# Patient Record
Sex: Female | Born: 1950 | Race: White | Hispanic: No | Marital: Married | Smoking: Never smoker
Health system: Southern US, Community
[De-identification: ages and names within clinical notes are randomized; demographics above are authoritative.]

## PROBLEM LIST (undated history)

## (undated) DIAGNOSIS — J40 Bronchitis, not specified as acute or chronic: Secondary | ICD-10-CM

## (undated) DIAGNOSIS — E785 Hyperlipidemia, unspecified: Secondary | ICD-10-CM

## (undated) DIAGNOSIS — G4730 Sleep apnea, unspecified: Secondary | ICD-10-CM

## (undated) DIAGNOSIS — R55 Syncope and collapse: Secondary | ICD-10-CM

## (undated) DIAGNOSIS — T7840XA Allergy, unspecified, initial encounter: Secondary | ICD-10-CM

## (undated) DIAGNOSIS — I5189 Other ill-defined heart diseases: Secondary | ICD-10-CM

## (undated) DIAGNOSIS — K649 Unspecified hemorrhoids: Secondary | ICD-10-CM

## (undated) DIAGNOSIS — A389 Scarlet fever, uncomplicated: Secondary | ICD-10-CM

## (undated) DIAGNOSIS — I10 Essential (primary) hypertension: Secondary | ICD-10-CM

## (undated) DIAGNOSIS — I2510 Atherosclerotic heart disease of native coronary artery without angina pectoris: Secondary | ICD-10-CM

## (undated) DIAGNOSIS — U071 COVID-19: Secondary | ICD-10-CM

## (undated) HISTORY — DX: Syncope and collapse: R55

## (undated) HISTORY — PX: CARPAL TUNNEL RELEASE: SHX101

## (undated) HISTORY — DX: Other ill-defined heart diseases: I51.89

## (undated) HISTORY — DX: Allergy, unspecified, initial encounter: T78.40XA

## (undated) HISTORY — DX: Unspecified hemorrhoids: K64.9

## (undated) HISTORY — DX: Bronchitis, not specified as acute or chronic: J40

## (undated) HISTORY — DX: Atherosclerotic heart disease of native coronary artery without angina pectoris: I25.10

## (undated) HISTORY — DX: Hyperlipidemia, unspecified: E78.5

## (undated) HISTORY — PX: OTHER SURGICAL HISTORY: SHX169

## (undated) HISTORY — DX: Essential (primary) hypertension: I10

## (undated) HISTORY — PX: APPENDECTOMY: SHX54

## (undated) HISTORY — DX: Scarlet fever, uncomplicated: A38.9

---

## 2008-09-28 LAB — HM COLONOSCOPY

## 2012-03-09 LAB — HM MAMMOGRAPHY

## 2015-11-28 LAB — HM PAP SMEAR: HM Pap smear: NEGATIVE

## 2017-04-20 ENCOUNTER — Ambulatory Visit: Payer: Self-pay | Admitting: Internal Medicine

## 2017-05-22 DIAGNOSIS — R69 Illness, unspecified: Secondary | ICD-10-CM | POA: Diagnosis not present

## 2017-05-25 DIAGNOSIS — H524 Presbyopia: Secondary | ICD-10-CM | POA: Diagnosis not present

## 2017-05-27 ENCOUNTER — Telehealth: Payer: Self-pay | Admitting: Internal Medicine

## 2017-05-27 ENCOUNTER — Encounter: Payer: Self-pay | Admitting: Internal Medicine

## 2017-05-27 ENCOUNTER — Ambulatory Visit (INDEPENDENT_AMBULATORY_CARE_PROVIDER_SITE_OTHER): Payer: Medicare HMO | Admitting: Internal Medicine

## 2017-05-27 VITALS — BP 150/76 | HR 56 | Temp 97.6°F | Ht <= 58 in | Wt 135.4 lb

## 2017-05-27 DIAGNOSIS — I10 Essential (primary) hypertension: Secondary | ICD-10-CM

## 2017-05-27 MED ORDER — METOPROLOL SUCCINATE ER 50 MG PO TB24: 50.0000 mg | ORAL_TABLET | Freq: Every day | ORAL | 0 refills | Status: DC

## 2017-05-27 MED ORDER — AMLODIPINE BESYLATE 2.5 MG PO TABS: 2.5000 mg | ORAL_TABLET | Freq: Two times a day (BID) | ORAL | 1 refills | Status: DC

## 2017-05-27 NOTE — Patient Instructions (Addendum)
Please record blood pressure at home  Increase Norvasc 2.5 mg 2x per day  And cont metoprolol  Follow up in 1 month   Hypertension Hypertension, commonly called high blood pressure, is when the force of blood pumping through the arteries is too strong. The arteries are the blood vessels that carry blood from the heart throughout the body. Hypertension forces the heart to work harder to pump blood and may cause arteries to become narrow or stiff. Having untreated or uncontrolled hypertension can cause heart attacks, strokes, kidney disease, and other problems. A blood pressure reading consists of a higher number over a lower number. Ideally, your blood pressure should be below 120/80. The first ("top") number is called the systolic pressure. It is a measure of the pressure in your arteries as your heart beats. The second ("bottom") number is called the diastolic pressure. It is a measure of the pressure in your arteries as the heart relaxes. What are the causes? The cause of this condition is not known. What increases the risk? Some risk factors for high blood pressure are under your control. Others are not. Factors you can change  Smoking.  Having type 2 diabetes mellitus, high cholesterol, or both.  Not getting enough exercise or physical activity.  Being overweight.  Having too much fat, sugar, calories, or salt (sodium) in your diet.  Drinking too much alcohol. Factors that are difficult or impossible to change  Having chronic kidney disease.  Having a family history of high blood pressure.  Age. Risk increases with age.  Race. You may be at higher risk if you are African-American.  Gender. Men are at higher risk than women before age 69. After age 69, women are at higher risk than men.  Having obstructive sleep apnea.  Stress. What are the signs or symptoms? Extremely high blood pressure (hypertensive crisis) may cause:  Headache.  Anxiety.  Shortness of  breath.  Nosebleed.  Nausea and vomiting.  Severe chest pain.  Jerky movements you cannot control (seizures).  How is this diagnosed? This condition is diagnosed by measuring your blood pressure while you are seated, with your arm resting on a surface. The cuff of the blood pressure monitor will be placed directly against the skin of your upper arm at the level of your heart. It should be measured at least twice using the same arm. Certain conditions can cause a difference in blood pressure between your right and left arms. Certain factors can cause blood pressure readings to be lower or higher than normal (elevated) for a short period of time:  When your blood pressure is higher when you are in a health care provider's office than when you are at home, this is called white coat hypertension. Most people with this condition do not need medicines.  When your blood pressure is higher at home than when you are in a health care provider's office, this is called masked hypertension. Most people with this condition may need medicines to control blood pressure.  If you have a high blood pressure reading during one visit or you have normal blood pressure with other risk factors:  You may be asked to return on a different day to have your blood pressure checked again.  You may be asked to monitor your blood pressure at home for 1 week or longer.  If you are diagnosed with hypertension, you may have other blood or imaging tests to help your health care provider understand your overall risk for other conditions.  How is this treated? This condition is treated by making healthy lifestyle changes, such as eating healthy foods, exercising more, and reducing your alcohol intake. Your health care provider may prescribe medicine if lifestyle changes are not enough to get your blood pressure under control, and if:  Your systolic blood pressure is above 130.  Your diastolic blood pressure is above  80.  Your personal target blood pressure may vary depending on your medical conditions, your age, and other factors. Follow these instructions at home: Eating and drinking  Eat a diet that is high in fiber and potassium, and low in sodium, added sugar, and fat. An example eating plan is called the DASH (Dietary Approaches to Stop Hypertension) diet. To eat this way: ? Eat plenty of fresh fruits and vegetables. Try to fill half of your plate at each meal with fruits and vegetables. ? Eat whole grains, such as whole wheat pasta, brown rice, or whole grain bread. Fill about one quarter of your plate with whole grains. ? Eat or drink low-fat dairy products, such as skim milk or low-fat yogurt. ? Avoid fatty cuts of meat, processed or cured meats, and poultry with skin. Fill about one quarter of your plate with lean proteins, such as fish, chicken without skin, beans, eggs, and tofu. ? Avoid premade and processed foods. These tend to be higher in sodium, added sugar, and fat.  Reduce your daily sodium intake. Most people with hypertension should eat less than 1,500 mg of sodium a day.  Limit alcohol intake to no more than 1 drink a day for nonpregnant women and 2 drinks a day for men. One drink equals 12 oz of beer, 5 oz of wine, or 1 oz of hard liquor. Lifestyle  Work with your health care provider to maintain a healthy body weight or to lose weight. Ask what an ideal weight is for you.  Get at least 30 minutes of exercise that causes your heart to beat faster (aerobic exercise) most days of the week. Activities may include walking, swimming, or biking.  Include exercise to strengthen your muscles (resistance exercise), such as pilates or lifting weights, as part of your weekly exercise routine. Try to do these types of exercises for 30 minutes at least 3 days a week.  Do not use any products that contain nicotine or tobacco, such as cigarettes and e-cigarettes. If you need help quitting, ask  your health care provider.  Monitor your blood pressure at home as told by your health care provider.  Keep all follow-up visits as told by your health care provider. This is important. Medicines  Take over-the-counter and prescription medicines only as told by your health care provider. Follow directions carefully. Blood pressure medicines must be taken as prescribed.  Do not skip doses of blood pressure medicine. Doing this puts you at risk for problems and can make the medicine less effective.  Ask your health care provider about side effects or reactions to medicines that you should watch for. Contact a health care provider if:  You think you are having a reaction to a medicine you are taking.  You have headaches that keep coming back (recurring).  You feel dizzy.  You have swelling in your ankles.  You have trouble with your vision. Get help right away if:  You develop a severe headache or confusion.  You have unusual weakness or numbness.  You feel faint.  You have severe pain in your chest or abdomen.  You vomit repeatedly.  You have trouble breathing. Summary  Hypertension is when the force of blood pumping through your arteries is too strong. If this condition is not controlled, it may put you at risk for serious complications.  Your personal target blood pressure may vary depending on your medical conditions, your age, and other factors. For most people, a normal blood pressure is less than 120/80.  Hypertension is treated with lifestyle changes, medicines, or a combination of both. Lifestyle changes include weight loss, eating a healthy, low-sodium diet, exercising more, and limiting alcohol. This information is not intended to replace advice given to you by your health care provider. Make sure you discuss any questions you have with your health care provider. Document Released: 05/04/2005 Document Revised: 04/01/2016 Document Reviewed: 04/01/2016 Elsevier  Interactive Patient Education  [employer].

## 2017-05-27 NOTE — Telephone Encounter (Signed)
Pt needs refills sent back  [facility]  On what was filled today due to pt last name  not being correct it wasn't [facility] it is [place].. I fixed it in the chart  [facility] just [facility] correct name

## 2017-05-27 NOTE — Progress Notes (Signed)
Chief Complaint  Patient presents with  . Establish Care   Establish care  1. HTN BP at home 137-mid 140s/80s today 158/80 on norvasc 2.5 and Metoprolol 50 er she takes both qhs  2. Scar to abdomen at times bothers her x 10-15 years on and off    Review of Systems  Constitutional: Negative for weight loss.  HENT: Negative for hearing loss.   Eyes:       No vision problems wears glasses   Respiratory: Negative for shortness of breath.   Cardiovascular: Negative for chest pain.  Gastrointestinal: Negative for abdominal pain.  Musculoskeletal: Negative for falls.  Skin: Negative for rash.  Neurological: Negative for headaches.  Psychiatric/Behavioral: Negative for memory loss.   Past Medical History:  Diagnosis Date  . Allergy   . Hypertension    Past Surgical History:  Procedure Laterality Date  . APPENDECTOMY     age [age]  . CARPAL TUNNEL RELEASE     left 2008  . OTHER SURGICAL HISTORY     left great toe fusion 2012    Family History  Problem Relation Age of Onset  . Other Mother        brain tumor died 9153   Social History   Socioeconomic History  . Marital status: Married    Spouse name: Not on file  . Number of children: Not on file  . Years of education: Not on file  . Highest education level: Not on file  Social Needs  . Financial resource strain: Not on file  . Food insecurity - worry: Not on file  . Food insecurity - inability: Not on file  . Transportation needs - medical: Not on file  . Transportation needs - non-medical: Not on file  Occupational History  . Not on file  Tobacco Use  . Smoking status: Never Smoker  . Smokeless tobacco: Never Used  Substance and Sexual Activity  . Alcohol use: No    Frequency: Never  . Drug use: No  . Sexual activity: No    Partners: Male  Other Topics Concern  . 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 amLODipine (NORVASC) 2.5 MG tablet Take 1 tablet (2.5 mg total) by mouth 2 (two) times daily.  . Carboxymeth-Glyc-Polysorb PF (REFRESH OPTIVE ADVANCED PF) 0.5-1-0.5 % SOLN Apply to eye.  . [DISCONTINUED] amLODipine (NORVASC) 2.5 MG tablet Take 2.5 mg by mouth daily.  . [DISCONTINUED] METOPROLOL SUCCINATE ER PO Take by mouth daily.    Allergies  Allergen Reactions  . Sulfa Antibiotics Itching    Throat tight  . Penicillins Rash   No results found for this or any previous visit (from the past 2160 hour(s)). Objective  Body mass index is 28.29 kg/m. Wt Readings from Last 3 Encounters:  05/27/17 135 lb 6 oz (61.4 kg)   Temp Readings from Last 3 Encounters:  05/27/17 97.6 F (36.4 C) (Oral)   BP Readings from Last 3 Encounters:  05/27/17 (!) 150/76   Pulse Readings from Last 3 Encounters:  05/27/17 (!) 56   O2 sat room air 96%  Physical Exam  Constitutional: She is oriented to person, place, and time and well-developed, well-nourished, and in no distress.  HENT:  Head: Normocephalic and atraumatic.  Mouth/Throat: Oropharynx is clear and moist and mucous membranes are normal.  Eyes: Conjunctivae are normal. Pupils are equal, round, and reactive to light.  Cardiovascular: Normal rate, regular rhythm and normal heart sounds.  No murmur heard. Pulmonary/Chest: Effort normal and breath sounds normal.  Abdominal: Soft. Bowel sounds are normal.  Neurological: She is alert and oriented to person, place, and time. Gait normal. Gait normal.  Skin: Skin is warm and dry.  Angiomas and benign nevi to skin   Psychiatric: Mood, memory, affect and judgment normal.  Nursing note and vitals reviewed.   Assessment   1. HTN  2. HM Plan   1. Increase norvasc to 2.5 mg bid, Metop ER 25 mg qhs pt takes b/c makes tired during the day  F/u in 1 month  Log BP 2.  Per pt had labs 02/2017 need to get from  old PCP Dr. Rande Brunt in [place]  Flu had 2019  pna vx had 2016 ? Which one will [facility] [place] [address]  Tdap per pt had 2013  zostervax had 2015 ? If had shingrix   Last pap had 2017 OB/GYn in [place] need to get records no h/o abnormal pap Dr. Tracey Harries in [place] Last colonoscopy 2015 nl per pt need to get records per pt they rec in 10 years no h/o polyps she has had colonoscopy x 2  Eye exam recently My eye doctor  DEXA will ask in future if has had  mammo had 02/2016 per pt need to get records from PCP  Provider: Dr. French Ana [name]

## 2017-05-28 NOTE — Telephone Encounter (Signed)
[facility] and she will be able to pick up script on 06/02/17 . Patient verbalized understanding.

## 2017-06-24 ENCOUNTER — Encounter: Payer: Self-pay | Admitting: Internal Medicine

## 2017-06-24 ENCOUNTER — Ambulatory Visit (INDEPENDENT_AMBULATORY_CARE_PROVIDER_SITE_OTHER): Payer: Medicare HMO | Admitting: Internal Medicine

## 2017-06-24 VITALS — BP 130/70 | HR 63 | Temp 98.1°F | Ht 58.5 in | Wt 137.0 lb

## 2017-06-24 DIAGNOSIS — I10 Essential (primary) hypertension: Secondary | ICD-10-CM

## 2017-06-24 DIAGNOSIS — Z13220 Encounter for screening for lipoid disorders: Secondary | ICD-10-CM

## 2017-06-24 DIAGNOSIS — R0681 Apnea, not elsewhere classified: Secondary | ICD-10-CM | POA: Diagnosis not present

## 2017-06-24 DIAGNOSIS — Z1329 Encounter for screening for other suspected endocrine disorder: Secondary | ICD-10-CM | POA: Diagnosis not present

## 2017-06-24 DIAGNOSIS — Z1231 Encounter for screening mammogram for malignant neoplasm of breast: Secondary | ICD-10-CM

## 2017-06-24 MED ORDER — AMLODIPINE BESYLATE 2.5 MG PO TABS: 2.5000 mg | ORAL_TABLET | Freq: Two times a day (BID) | ORAL | 1 refills | Status: DC

## 2017-06-24 MED ORDER — METOPROLOL SUCCINATE ER 50 MG PO TB24: 50.0000 mg | ORAL_TABLET | Freq: Every day | ORAL | 1 refills | Status: DC

## 2017-06-24 NOTE — Patient Instructions (Signed)
F/u fasting labs 11/2017 and sch f/u with me in 2 days after labs  Take care  Consider bone density and sleep study   Bone Densitometry Bone densitometry is an imaging test that uses a special X-ray to measure the amount of calcium and other minerals in your bones (bone density). This test is also known as a bone mineral density test or dual-energy X-ray absorptiometry (DXA). The test can measure bone density at your hip and your spine. It is similar to having a regular X-ray. You may have this test to:  Diagnose a condition that causes weak or thin bones (osteoporosis).  Predict your risk of a broken bone (fracture).  Determine how well osteoporosis treatment is working.  Tell a health care provider about:  Any allergies you have.  All medicines you are taking, including vitamins, herbs, eye drops, creams, and over-the-counter medicines.  Any problems you or family members have had with anesthetic medicines.  Any blood disorders you have.  Any surgeries you have had.  Any medical conditions you have.  Possibility of pregnancy.  Any other medical test you had within the previous 14 days that used contrast material. What are the risks? Generally, this is a safe procedure. However, problems can occur and may include the following:  This test exposes you to a very small amount of radiation.  The risks of radiation exposure may be greater to unborn children.  What happens before the procedure?  Do not take any calcium supplements for 24 hours before having the test. You can otherwise eat and drink what you usually do.  Take off all metal jewelry, eyeglasses, dental appliances, and any other metal objects. What happens during the procedure?  You may lie on an exam table. There will be an X-ray generator below you and an imaging device above you.  Other devices, such as boxes or braces, may be used to position your body properly for the scan.  You will need to lie still  while the machine slowly scans your body.  The images will show up on a computer monitor. What happens after the procedure? You may need more testing at a later time. This information is not intended to replace advice given to you by your health care provider. Make sure you discuss any questions you have with your health care provider. Document Released: 05/26/2004 Document Revised: 10/10/2015 Document Reviewed: 10/12/2013 Elsevier Interactive Patient Education  2018 Elsevier Inc.   Sleep Apnea Sleep apnea is a condition in which breathing pauses or becomes shallow during sleep. Episodes of sleep apnea usually last 10 seconds or longer, and they may occur as many as 20 times an hour. Sleep apnea disrupts your sleep and keeps your body from getting the rest that it needs. This condition can increase your risk of certain health problems, including:  Heart attack.  Stroke.  Obesity.  Diabetes.  Heart failure.  Irregular heartbeat.  There are three kinds of sleep apnea:  Obstructive sleep apnea. This kind is caused by a blocked or collapsed airway.  Central sleep apnea. This kind happens when the part of the brain that controls breathing does not send the correct signals to the muscles that control breathing.  Mixed sleep apnea. This is a combination of obstructive and central sleep apnea.  What are the causes? The most common cause of this condition is a collapsed or blocked airway. An airway can collapse or become blocked if:  Your throat muscles are abnormally relaxed.  Your tongue and  tonsils are larger than normal.  You are overweight.  Your airway is smaller than normal.  What increases the risk? This condition is more likely to develop in people who:  Are overweight.  Smoke.  Have a smaller than normal airway.  Are elderly.  Are female.  Drink alcohol.  Take sedatives or tranquilizers.  Have a family history of sleep apnea.  What are the signs or  symptoms? Symptoms of this condition include:  Trouble staying asleep.  Daytime sleepiness and tiredness.  Irritability.  Loud snoring.  Morning headaches.  Trouble concentrating.  Forgetfulness.  Decreased interest in sex.  Unexplained sleepiness.  Mood swings.  Personality changes.  Feelings of depression.  Waking up often during the night to urinate.  Dry mouth.  Sore throat.  How is this diagnosed? This condition may be diagnosed with:  A medical history.  A physical exam.  A series of tests that are done while you are sleeping (sleep study). These tests are usually done in a sleep lab, but they may also be done at home.  How is this treated? Treatment for this condition aims to restore normal breathing and to ease symptoms during sleep. It may involve managing health issues that can affect breathing, such as high blood pressure or obesity. Treatment may include:  Sleeping on your side.  Using a decongestant if you have nasal congestion.  Avoiding the use of depressants, including alcohol, sedatives, and narcotics.  Losing weight if you are overweight.  Making changes to your diet.  Quitting smoking.  Using a device to open your airway while you sleep, such as: ? An oral appliance. This is a custom-made mouthpiece that shifts your lower jaw forward. ? A continuous positive airway pressure (CPAP) device. This device delivers oxygen to your airway through a mask. ? A nasal expiratory positive airway pressure (EPAP) device. This device has valves that you put into each nostril. ? A bi-level positive airway pressure (BPAP) device. This device delivers oxygen to your airway through a mask.  Surgery if other treatments do not work. During surgery, excess tissue is removed to create a wider airway.  It is important to get treatment for sleep apnea. Without treatment, this condition can lead to:  High blood pressure.  Coronary artery disease.  (Men)  An inability to achieve or maintain an erection (impotence).  Reduced thinking abilities.  Follow these instructions at home:  Make any lifestyle changes that your health care provider recommends.  Eat a healthy, well-balanced diet.  Take over-the-counter and prescription medicines only as told by your health care provider.  Avoid using depressants, including alcohol, sedatives, and narcotics.  Take steps to lose weight if you are overweight.  If you were given a device to open your airway while you sleep, use it only as told by your health care provider.  Do not use any tobacco products, such as cigarettes, chewing tobacco, and e-cigarettes. If you need help quitting, ask your health care provider.  Keep all follow-up visits as told by your health care provider. This is important. Contact a health care provider if:  The device that you received to open your airway during sleep is uncomfortable or does not seem to be working.  Your symptoms do not improve.  Your symptoms get worse. Get help right away if:  You develop chest pain.  You develop shortness of breath.  You develop discomfort in your back, arms, or stomach.  You have trouble speaking.  You have weakness on  one side of your body.  You have drooping in your face. These symptoms may represent a serious problem that is an emergency. Do not wait to see if the symptoms will go away. Get medical help right away. Call your local emergency services (911 in the [place] Do not drive yourself to the hospital. This information is not intended to replace advice given to you by your health care provider. Make sure you discuss any questions you have with your health care provider. Document Released: 04/24/2002 Document Revised: 12/29/2015 Document Reviewed: 02/11/2015 Elsevier Interactive Patient Education  [employer].

## 2017-06-24 NOTE — Progress Notes (Signed)
Pre visit review using our clinic review tool, if applicable. No additional management support is needed unless otherwise documented below in the visit note. 

## 2017-06-24 NOTE — Progress Notes (Addendum)
Chief Complaint  Patient presents with  . Follow-up   Follow up  HTN improved  BP readings 130s-140s/70s-80s. She reports she is exercising doing elliptical 30 minutes qd. On norvasc 2.5 mg bid and toprol XL 50 qd   She reports her husband notices she stops breathing qhs her son has sleep apnea reviewed sleep study pt wants to wait for now      Review of Systems  Constitutional: Negative for weight loss.  HENT: Negative for hearing loss.   Eyes: Negative for blurred vision.  Respiratory: Negative for shortness of breath.   Cardiovascular: Negative for chest pain.  Gastrointestinal: Negative for abdominal pain.  Musculoskeletal: Negative for falls.  Skin: Negative for rash.  Neurological: Negative for headaches.  Psychiatric/Behavioral: Negative for memory loss.       +stress    Past Medical History:  Diagnosis Date  . Allergy   . Hypertension    Past Surgical History:  Procedure Laterality Date  . APPENDECTOMY     age [age]  . CARPAL TUNNEL RELEASE     left 2008  . OTHER SURGICAL HISTORY     left great toe fusion 2012    Family History  Problem Relation Age of Onset  . Other Mother        brain tumor died 3253   Social History   Socioeconomic History  . Marital status: Married    Spouse name: Not on file  . Number of children: Not on file  . Years of education: Not on file  . Highest education level: Not on file  Social Needs  . Financial resource strain: Not on file  . Food insecurity - worry: Not on file  . Food insecurity - inability: Not on file  . Transportation needs - medical: Not on file  . Transportation needs - non-medical: Not on file  Occupational History  . Not on file  Tobacco Use  . Smoking status: Never Smoker  . Smokeless tobacco: Never Used  Substance and Sexual Activity  . Alcohol use: No    Frequency: Never  . Drug use: No  . Sexual activity: No    Partners: Male  Other Topics Concern  . 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 amLODipine (NORVASC) 2.5 MG tablet Take 1 tablet (2.5 mg total) by mouth 2 (two) times daily.  . Carboxymeth-Glyc-Polysorb PF (REFRESH OPTIVE ADVANCED PF) 0.5-1-0.5 % SOLN Apply to eye.  Marland Kitchen. FLUZONE HIGH-DOSE 0.5 ML injection   . metoprolol succinate (TOPROL-XL) 50 MG 24 hr tablet Take 1 tablet (50 mg total) by mouth daily.  . [DISCONTINUED] amLODipine (NORVASC) 2.5 MG tablet Take 1 tablet (2.5 mg total) by mouth 2 (two) times daily.  . [DISCONTINUED] metoprolol succinate (TOPROL-XL) 50 MG 24 hr tablet Take 1 tablet (50 mg total) by mouth daily.   Allergies  Allergen Reactions  . Sulfa Antibiotics Itching    Throat tight  . Penicillins Rash   No results found for this or any previous visit (from the past 2160 hour(s)). Objective  Body mass index is 28.15 kg/m. Wt Readings from Last 3 Encounters:  06/24/17 137 lb (62.1 kg)  05/27/17 135 lb 6 oz (61.4 kg)   Temp Readings from Last 3 Encounters:  06/24/17  98.1 F (36.7 C) (Oral)  05/27/17 97.6 F (36.4 C) (Oral)   BP Readings from Last 3 Encounters:  06/24/17 130/70  05/27/17 (!) 150/76   Pulse Readings from Last 3 Encounters:  06/24/17 63  05/27/17 (!) 56   O2 sat room air 96% Physical Exam  Constitutional: She is oriented to person, place, and time and well-developed, well-nourished, and in no distress.  HENT:  Head: Normocephalic and atraumatic.  Eyes: Conjunctivae are normal. Pupils are equal, round, and reactive to light.  Cardiovascular: Normal rate, regular rhythm and normal heart sounds.  Pulmonary/Chest: Effort normal and breath sounds normal.  Neurological: She is alert and oriented to person, place, and time. Gait normal.  Skin: Skin is warm and dry.  Psychiatric: Mood, memory, affect and judgment normal.  Nursing note and  vitals reviewed.   Assessment   1 HTN improved  2. C/w apeneic episodes c/w OSA 3. HM  Plan   1.  CMET, CBC, UA, lipid, TSH b/f next visit physical 11/2017 Cont norvasc 2.5 bid, toprol 50 XL 2. Consider sleep study disc with pt she wants to wait  3.  Flu had 2019  pna vx had 2016 ? Which one will need to call [facility] in [place] [address]  Tdap per pt had 2013  zostervax had 2015 has not had shingrix and declines another shingles vaccine had rash with 1st   Last pap had 2017 OB/GYn in [place] need to get records no h/o abnormal pap Dr. Tracey Harries in [place] -obtained records 11/28/15 Dr. Tracey Harries neg pap +atrophy HPV neg  Last colonoscopy 2015 nl per pt need to get records per pt they rec in 10 years no h/o polyps she has had colonoscopy x 2  Eye exam recently My eye doctor  DEXA last had >10 years ago will think about another  mammo had 02/2016 per pt need to get records from PCP  -last mammo I can see 03/09/12 neg      mammo 03/09/12 neg fibroglandular breasts US 12/15/10 benign left breast US birads 2, small 9 mm x 5 x 6 mm diameter lymph node in left axilla which does not appear responsible for pts palpable lump                                Disc hep B/C testing pt declines for now   Pending records from [place] pt will call today to try to get faxed release sent 1 month ago   Ob/gyn records reviewed 11/27/15  Pap 11/28/15 neg pap, neg HPV, +atrophy  Pap 01/25/12 neg pap neg HPV   Provider: Dr. French Ana [name]-Internal Medicine

## 2017-06-30 DIAGNOSIS — R69 Illness, unspecified: Secondary | ICD-10-CM | POA: Diagnosis not present

## 2017-07-01 NOTE — Progress Notes (Unsigned)
Pre visit review using our clinic review tool, if applicable. No additional management support is needed unless otherwise documented below in the visit note. 

## 2017-07-27 ENCOUNTER — Telehealth: Payer: Self-pay | Admitting: Internal Medicine

## 2017-07-27 DIAGNOSIS — I10 Essential (primary) hypertension: Secondary | ICD-10-CM

## 2017-07-27 MED ORDER — AMLODIPINE BESYLATE 2.5 MG PO TABS: 2.5000 mg | ORAL_TABLET | Freq: Two times a day (BID) | ORAL | 1 refills | Status: DC

## 2017-07-27 NOTE — Telephone Encounter (Signed)
Copied from CRM [phone]. Topic: Quick Communication - Rx Refill/Question >> Jul 27, 2017 10:55 AM Sherrie GeorgeFoltz, Melissa J wrote: Medication: amLODipine (NORVASC) 2.5 MG tablet   Has the patient contacted their pharmacy? Yes.     (Agent: If no, request that the patient contact the pharmacy for the refill.)   Preferred Pharmacy (with phone number or street name): [facility] [address] [place]   Agent: Please be advised that RX refills may take up to 3 business days. We ask that you follow-up with your pharmacy.

## 2017-09-10 NOTE — Addendum Note (Signed)
Addended by: Quentin OreMCLEAN-SCOCUZZA, Kallan Bischoff on: 09/10/2017 09:02 AM   Modules accepted: Orders

## 2017-11-03 ENCOUNTER — Ambulatory Visit: Payer: Medicare HMO | Attending: Internal Medicine

## 2017-11-08 ENCOUNTER — Ambulatory Visit (INDEPENDENT_AMBULATORY_CARE_PROVIDER_SITE_OTHER): Payer: Medicare HMO

## 2017-11-08 VITALS — BP 118/62 | HR 60 | Temp 98.0°F | Resp 14 | Ht 59.0 in | Wt 134.8 lb

## 2017-11-08 DIAGNOSIS — Z0000 Encounter for general adult medical examination without abnormal findings: Secondary | ICD-10-CM

## 2017-11-08 NOTE — Patient Instructions (Addendum)
  Ms. Erica Valencia , Thank you for taking time to come for your Medicare Wellness Visit. I appreciate your ongoing commitment to your health goals. Please review the following plan we discussed and let me know if I can assist you in the future.   These are the goals we discussed: Goals    . Increase physical activity     10,000 steps       This is a list of the screening recommended for you and due dates:  Health Maintenance  Topic Date Due  . Tetanus Vaccine  04/16/1968  . Colon Cancer Screening  04/17/1999  . Mammogram  03/09/2014  . DEXA scan (bone density measurement)  04/16/2014  . Pneumonia vaccines (1 of 2 - PCV13) 04/16/2014  .  Hepatitis C: One time screening is recommended by Center for Disease Control  (CDC) for  adults born from [birth year] through 1965.   06/24/2018*  . Flu Shot  12/16/2017  *Topic was postponed. The date shown is not the original due date.

## 2017-11-08 NOTE — Progress Notes (Signed)
Subjective:   Erica Valencia is a 67 y.o. female who presents for an Initial Medicare Annual Wellness Visit.  Review of Systems    No ROS.  Medicare Wellness Visit. Additional risk factors are reflected in the social history.  Cardiac Risk Factors include: advanced age (>11men, >12 women);hypertension     Objective:    Today's Vitals   11/08/17 1143  BP: 118/62  Pulse: 60  Resp: 14  Temp: 98 F (36.7 C)  TempSrc: Oral  SpO2: 95%  Weight: 134 lb 12.8 oz (61.1 kg)  Height: 4\' 11"  (1.499 m)   Body mass index is 27.23 kg/m.  Advanced Directives 11/08/2017  Does Patient Have a Medical Advance Directive? No  Would patient like information on creating a medical advance directive? No - Patient declined    Current Medications (verified) Outpatient Encounter Medications as of 11/08/2017  Medication Sig  . amLODipine (NORVASC) 2.5 MG tablet Take 1 tablet (2.5 mg total) by mouth 2 (two) times daily.  . Carboxymeth-Glyc-Polysorb PF (REFRESH OPTIVE ADVANCED PF) 0.5-1-0.5 % SOLN Apply to eye.  . metoprolol succinate (TOPROL-XL) 50 MG 24 hr tablet Take 1 tablet (50 mg total) by mouth daily.  Marland Kitchen FLUZONE HIGH-DOSE 0.5 ML injection    No facility-administered encounter medications on file as of 11/08/2017.     Allergies (verified) Sulfa antibiotics and Penicillins   History: Past Medical History:  Diagnosis Date  . Allergy   . Hypertension    Past Surgical History:  Procedure Laterality Date  . APPENDECTOMY     age [age]  . CARPAL TUNNEL RELEASE     left 2008  . OTHER SURGICAL HISTORY     left great toe fusion 2012    Family History  Problem Relation Age of Onset  . Other Mother        brain tumor died 53   Social History   Socioeconomic History  . Marital status: Married    Spouse name: Not on file  . Number of children: Not on file  . Years of education: Not on file  . Highest education level: Not on file  Occupational History  . Not on file  Social Needs  .  Financial resource strain: Not on file  . Food insecurity:    Worry: Not on file    Inability: Not on file  . Transportation needs:    Medical: Not on file    Non-medical: Not on file  Tobacco Use  . Smoking status: Never Smoker  . Smokeless tobacco: Never Used  Substance and Sexual Activity  . Alcohol use: No    Frequency: Never  . Drug use: No  . Sexual activity: Never    Partners: Male  Lifestyle  . Physical activity:    Days per week: Not on file    Minutes per session: Not on file  . Stress: Not on file  Relationships  . Social connections:    Talks on phone: Not on file    Gets together: Not on file    Attends religious service: Not on file    Active member of club or organization: Not on file    Attends meetings of clubs or organizations: Not on file    Relationship status: Not on file  Other Topics Concern  . 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 N  Vision? N  Difficulty concentrating or making decisions? N  Walking or climbing stairs? N  Dressing or bathing? N  Doing errands, shopping? N  Preparing Food and eating ? N  Using the Toilet? N  In the past six months, have you accidently leaked urine? N  Do you have problems with loss of bowel control? N  Managing your  Medications? N  Managing your Finances? N  Housekeeping or managing your Housekeeping? N  Some recent data might be hidden     Immunizations and Health Maintenance Immunization History  Administered Date(s) Administered  . Influenza-Unspecified 05/25/2017   Health Maintenance Due  Topic Date Due  . TETANUS/TDAP  04/16/1968  . COLONOSCOPY  04/17/1999  . MAMMOGRAM  03/09/2014  . DEXA SCAN  04/16/2014  . PNA vac Low Risk Adult (1 of 2 - PCV13) 04/16/2014    Patient Care Team: [name], Pasty Spillersracy N, MD as PCP - General (Internal Medicine)  Indicate any recent Medical Services you may have received from other than [facility] providers in the past year (date may be approximate).     Assessment:   This is a routine wellness examination for Erica Valencia.  The goal of the wellness visit is to assist the patient how to close the gaps in care and create a preventative care plan for the patient.   The roster of all physicians providing medical care to patient is listed in the Snapshot section of the chart.  Osteoporosis risk reviewed.    Safety issues reviewed; Smoke and carbon monoxide detectors in the home. No firearms in the home. Wears seatbelts when driving or riding with others. No violence in the home.  They do not have excessive sun exposure.  Discussed the need for sun protection: hats, long sleeves and the use of sunscreen if there is significant sun exposure.  Patient is alert, normal appearance, oriented to person/place/and time. Correctly identified the president of the [place] and recalls of 3/3 words.Performs simple calculations and can read correct time from watch face. Displays appropriate judgement.  No new identified risk were noted.  No failures at ADL's or IADL's.    BMI- discussed the importance of a healthy diet, water intake and the benefits of aerobic exercise. Educational material provided.   24 hour diet recall: Low carb diet  Dental- every 6 months.  Eye-  Visual acuity not assessed per patient preference since they have regular follow up with the ophthalmologist.  Wears corrective lenses.  Sleep patterns- Sleeps 6 hours at night.  Wakes feeling rested.   TDAP vaccine deferred per patient preference.  Follow up with insurance.  Educational material provided.  Colonoscopy, Dexa Scan, Pneumococcal vaccine discussed. She believes she has these completed within the appropriate time frame while being seen at another facility.  She plans to follow up with pcp with record.   Patient Concerns: None at this time. Follow up with PCP  as needed.  Hearing/Vision screen Hearing Screening Comments: Patient is able to hear conversational tones without difficulty.  No issues reported.   Vision Screening Comments: Followed by [facility] Wears corrective lenses Last OV 05/2017 Visual acuity not assessed per patient preference since they have regular follow up with the ophthalmologist  Dietary issues and exercise activities discussed: Current Exercise Habits: Structured exercise class, Type of exercise: walking;calisthenics(Water aerobics), Time (Minutes): 60, Frequency (Times/Week): 5, Weekly Exercise (Minutes/Week): 300, Intensity: Moderate  Goals    . Increase physical activity     10,000 steps      Depression Screen PHQ 2/9 Scores 11/08/2017 06/24/2017  PHQ - 2 Score 0 0    Fall Risk Fall Risk  11/08/2017 06/24/2017  Falls in the past year? No No   Cognitive Function: MMSE - Mini Mental State Exam 11/08/2017  Orientation to time 5  Orientation to Place 5  Registration 3  Attention/ Calculation 5  Recall 3  Language- name 2 objects 2  Language- repeat 1  Language- follow 3 step command 3  Language- read & follow direction 1  Write a sentence 1  Copy design 1  Total score 30        Screening Tests Health Maintenance  Topic Date Due  . TETANUS/TDAP  04/16/1968  . COLONOSCOPY  04/17/1999  . MAMMOGRAM  03/09/2014  . DEXA SCAN   04/16/2014  . PNA vac Low Risk Adult (1 of 2 - PCV13) 04/16/2014  . Hepatitis C Screening  06/24/2018 (Originally 18-Apr-1951)  . INFLUENZA VACCINE  12/16/2017     Plan:   End of life planning; Advanced aging; Advanced directives discussed.  No HCPOA/Living Will.  Additional information declined at this time.  I have personally reviewed and noted the following in the patient's chart:   . Medical and social history . Use of alcohol, tobacco or illicit drugs  . Current medications and supplements . Functional ability and status . Nutritional status . Physical activity . Advanced directives . List of other physicians . Hospitalizations, surgeries, and ER visits in previous 12 months . Vitals . Screenings to include cognitive, depression, and falls . Referrals and appointments  In addition, I have reviewed and discussed with patient certain preventive protocols, quality metrics, and best practice recommendations. A written personalized care plan for preventive services as well as general preventive health recommendations were provided to patient.     Ashok Pall, LPN   1/61/0960

## 2017-11-23 ENCOUNTER — Other Ambulatory Visit: Payer: Medicare HMO

## 2017-11-24 ENCOUNTER — Ambulatory Visit: Payer: Medicare HMO | Admitting: Internal Medicine

## 2017-12-15 ENCOUNTER — Other Ambulatory Visit (INDEPENDENT_AMBULATORY_CARE_PROVIDER_SITE_OTHER): Payer: Medicare HMO

## 2017-12-15 ENCOUNTER — Other Ambulatory Visit: Payer: Medicare HMO

## 2017-12-15 DIAGNOSIS — I10 Essential (primary) hypertension: Secondary | ICD-10-CM | POA: Diagnosis not present

## 2017-12-15 DIAGNOSIS — Z13220 Encounter for screening for lipoid disorders: Secondary | ICD-10-CM | POA: Diagnosis not present

## 2017-12-15 DIAGNOSIS — Z1329 Encounter for screening for other suspected endocrine disorder: Secondary | ICD-10-CM | POA: Diagnosis not present

## 2017-12-15 LAB — COMPREHENSIVE METABOLIC PANEL
ALT: 15 U/L (ref 0–35)
AST: 17 U/L (ref 0–37)
Albumin: 4.4 g/dL (ref 3.5–5.2)
Alkaline Phosphatase: 61 U/L (ref 39–117)
BUN: 16 mg/dL (ref 6–23)
CO2: 30 mEq/L (ref 19–32)
Calcium: 9.5 mg/dL (ref 8.4–10.5)
Chloride: 105 mEq/L (ref 96–112)
Creatinine, Ser: 0.73 mg/dL (ref 0.40–1.20)
GFR: 84.1 mL/min (ref >60.00)
Glucose, Bld: 105 mg/dL — ABNORMAL HIGH (ref 70–99)
Potassium: 4.5 mEq/L (ref 3.5–5.1)
Sodium: 141 mEq/L (ref 135–145)
Total Bilirubin: 0.6 mg/dL (ref 0.2–1.2)
Total Protein: 6.9 g/dL (ref 6.0–8.3)

## 2017-12-15 LAB — CBC WITH DIFFERENTIAL/PLATELET
Basophils Absolute: 0 10*3/uL (ref 0.0–0.1)
Basophils Relative: 0.5 % (ref 0.0–3.0)
Eosinophils Absolute: 0.1 10*3/uL (ref 0.0–0.7)
Eosinophils Relative: 1.9 % (ref 0.0–5.0)
HCT: 43 % (ref 36.0–46.0)
Hemoglobin: 14.7 g/dL (ref 12.0–15.0)
Lymphocytes Relative: 30.8 % (ref 12.0–46.0)
Lymphs Abs: 1.7 10*3/uL (ref 0.7–4.0)
MCHC: 34.1 g/dL (ref 30.0–36.0)
MCV: 88.3 fl (ref 78.0–100.0)
Monocytes Absolute: 0.5 10*3/uL (ref 0.1–1.0)
Monocytes Relative: 9.8 % (ref 3.0–12.0)
Neutro Abs: 3.2 10*3/uL (ref 1.4–7.7)
Neutrophils Relative %: 57 % (ref 43.0–77.0)
Platelets: 243 10*3/uL (ref 150.0–400.0)
RBC: 4.87 Mil/uL (ref 3.87–5.11)
RDW: 12.9 % (ref 11.5–15.5)
WBC: 5.5 10*3/uL (ref 4.0–10.5)

## 2017-12-15 LAB — URINALYSIS, ROUTINE W REFLEX MICROSCOPIC
Bilirubin Urine: NEGATIVE
Hgb urine dipstick: NEGATIVE
Ketones, ur: NEGATIVE
Leukocytes, UA: NEGATIVE
Nitrite: NEGATIVE
Specific Gravity, Urine: 1.01 (ref 1.000–1.030)
Total Protein, Urine: NEGATIVE
Urine Glucose: NEGATIVE
Urobilinogen, UA: 0.2 (ref 0.0–1.0)
pH: 6.5 (ref 5.0–8.0)

## 2017-12-15 LAB — LIPID PANEL
Cholesterol: 214 mg/dL — ABNORMAL HIGH (ref 0–200)
HDL: 63.2 mg/dL (ref >39.00)
LDL Cholesterol: 129 mg/dL — ABNORMAL HIGH (ref 0–99)
NonHDL: 150.46
Total CHOL/HDL Ratio: 3
Triglycerides: 108 mg/dL (ref 0.0–149.0)
VLDL: 21.6 mg/dL (ref 0.0–40.0)

## 2017-12-15 LAB — TSH: TSH: 1.69 u[IU]/mL (ref 0.35–4.50)

## 2017-12-16 ENCOUNTER — Ambulatory Visit (INDEPENDENT_AMBULATORY_CARE_PROVIDER_SITE_OTHER): Payer: Medicare HMO | Admitting: Internal Medicine

## 2017-12-16 ENCOUNTER — Encounter: Payer: Self-pay | Admitting: Internal Medicine

## 2017-12-16 VITALS — BP 142/80 | HR 51 | Temp 97.7°F | Resp 15 | Ht <= 58 in | Wt 137.4 lb

## 2017-12-16 DIAGNOSIS — I10 Essential (primary) hypertension: Secondary | ICD-10-CM

## 2017-12-16 DIAGNOSIS — Z1231 Encounter for screening mammogram for malignant neoplasm of breast: Secondary | ICD-10-CM

## 2017-12-16 DIAGNOSIS — L304 Erythema intertrigo: Secondary | ICD-10-CM | POA: Diagnosis not present

## 2017-12-16 DIAGNOSIS — E785 Hyperlipidemia, unspecified: Secondary | ICD-10-CM | POA: Diagnosis not present

## 2017-12-16 MED ORDER — MUPIROCIN 2 % EX OINT: 1.0000 "application " | TOPICAL_OINTMENT | Freq: Two times a day (BID) | CUTANEOUS | 0 refills | Status: DC

## 2017-12-16 MED ORDER — AMLODIPINE BESYLATE 2.5 MG PO TABS: 2.5000 mg | ORAL_TABLET | Freq: Two times a day (BID) | ORAL | 3 refills | Status: DC

## 2017-12-16 MED ORDER — CLOTRIMAZOLE 1 % EX CREA: 1.0000 "application " | TOPICAL_CREAM | Freq: Two times a day (BID) | CUTANEOUS | 0 refills | Status: DC

## 2017-12-16 MED ORDER — METOPROLOL SUCCINATE ER 50 MG PO TB24: 50.0000 mg | ORAL_TABLET | Freq: Every day | ORAL | 3 refills | Status: DC

## 2017-12-16 NOTE — Patient Instructions (Addendum)
Hibiscus tea and Beets good for blood pressure  sch appt fasting labs in 4 month and see me 1 week after labs  Zeasorb AF Powder   DASH Eating Plan DASH stands for "Dietary Approaches to Stop Hypertension." The DASH eating plan is a healthy eating plan that has been shown to reduce high blood pressure (hypertension). It may also reduce your risk for type 2 diabetes, heart disease, and stroke. The DASH eating plan may also help with weight loss. What are tips for following this plan? General guidelines  Avoid eating more than 2,300 mg (milligrams) of salt (sodium) a day. If you have hypertension, you may need to reduce your sodium intake to 1,500 mg a day.  Limit alcohol intake to no more than 1 drink a day for nonpregnant women and 2 drinks a day for men. One drink equals 12 oz of beer, 5 oz of wine, or 1 oz of hard liquor.  Work with your health care provider to maintain a healthy body weight or to lose weight. Ask what an ideal weight is for you.  Get at least 30 minutes of exercise that causes your heart to beat faster (aerobic exercise) most days of the week. Activities may include walking, swimming, or biking.  Work with your health care provider or diet and nutrition specialist (dietitian) to adjust your eating plan to your individual calorie needs. Reading food labels  Check food labels for the amount of sodium per serving. Choose foods with less than 5 percent of the Daily Value of sodium. Generally, foods with less than 300 mg of sodium per serving fit into this eating plan.  To find whole grains, look for the word "whole" as the first word in the ingredient list. Shopping  Buy products labeled as "low-sodium" or "no salt added."  Buy fresh foods. Avoid canned foods and premade or frozen meals. Cooking  Avoid adding salt when cooking. Use salt-free seasonings or herbs instead of table salt or sea salt. Check with your health care provider or pharmacist before using salt  substitutes.  Do not fry foods. Cook foods using healthy methods such as baking, boiling, grilling, and broiling instead.  Cook with heart-healthy oils, such as olive, canola, soybean, or sunflower oil. Meal planning   Eat a balanced diet that includes: ? 5 or more servings of fruits and vegetables each day. At each meal, try to fill half of your plate with fruits and vegetables. ? Up to 6-8 servings of whole grains each day. ? Less than 6 oz of lean meat, poultry, or fish each day. A 3-oz serving of meat is about the same size as a deck of cards. One egg equals 1 oz. ? 2 servings of low-fat dairy each day. ? A serving of nuts, seeds, or beans 5 times each week. ? Heart-healthy fats. Healthy fats called Omega-3 fatty acids are found in foods such as flaxseeds and coldwater fish, like sardines, salmon, and mackerel.  Limit how much you eat of the following: ? Canned or prepackaged foods. ? Food that is high in trans fat, such as fried foods. ? Food that is high in saturated fat, such as fatty meat. ? Sweets, desserts, sugary drinks, and other foods with added sugar. ? Full-fat dairy products.  Do not salt foods before eating.  Try to eat at least 2 vegetarian meals each week.  Eat more home-cooked food and less restaurant, buffet, and fast food.  When eating at a restaurant, ask that your food  be prepared with less salt or no salt, if possible. What foods are recommended? The items listed may not be a complete list. Talk with your dietitian about what dietary choices are best for you. Grains Whole-grain or whole-wheat bread. Whole-grain or whole-wheat pasta. Brown rice. Erica Valencia. Bulgur. Whole-grain and low-sodium cereals. Pita bread. Low-fat, low-sodium crackers. Whole-wheat flour tortillas. Vegetables Fresh or frozen vegetables (raw, steamed, roasted, or grilled). Low-sodium or reduced-sodium tomato and vegetable juice. Low-sodium or reduced-sodium tomato sauce and tomato  paste. Low-sodium or reduced-sodium canned vegetables. Fruits All fresh, dried, or frozen fruit. Canned fruit in natural juice (without added sugar). Meat and other protein foods Skinless chicken or [place]. Ground chicken or [place]. Pork with fat trimmed off. Fish and seafood. Egg whites. Dried beans, peas, or lentils. Unsalted nuts, nut butters, and seeds. Unsalted canned beans. Lean cuts of beef with fat trimmed off. Low-sodium, lean deli meat. Dairy Low-fat (1%) or fat-free (skim) milk. Fat-free, low-fat, or reduced-fat cheeses. Nonfat, low-sodium ricotta or cottage cheese. Low-fat or nonfat yogurt. Low-fat, low-sodium cheese. Fats and oils Soft margarine without trans fats. Vegetable oil. Low-fat, reduced-fat, or light mayonnaise and salad dressings (reduced-sodium). Canola, safflower, olive, soybean, and sunflower oils. Avocado. Seasoning and other foods Herbs. Spices. Seasoning mixes without salt. Unsalted popcorn and pretzels. Fat-free sweets. What foods are not recommended? The items listed may not be a complete list. Talk with your dietitian about what dietary choices are best for you. Grains Baked goods made with fat, such as croissants, muffins, or some breads. Dry pasta or rice meal packs. Vegetables Creamed or fried vegetables. Vegetables in a cheese sauce. Regular canned vegetables (not low-sodium or reduced-sodium). Regular canned tomato sauce and paste (not low-sodium or reduced-sodium). Regular tomato and vegetable juice (not low-sodium or reduced-sodium). Erica Valencia. Olives. Fruits Canned fruit in a light or heavy syrup. Fried fruit. Fruit in cream or butter sauce. Meat and other protein foods Fatty cuts of meat. Ribs. Fried meat. Erica Valencia. Sausage. Bologna and other processed lunch meats. Salami. Fatback. Hotdogs. Bratwurst. Salted nuts and seeds. Canned beans with added salt. Canned or smoked fish. Whole eggs or egg yolks. Chicken or [place] with skin. Dairy Whole or 2% milk,  cream, and half-and-half. Whole or full-fat cream cheese. Whole-fat or sweetened yogurt. Full-fat cheese. Nondairy creamers. Whipped toppings. Processed cheese and cheese spreads. Fats and oils Butter. Stick margarine. Lard. Shortening. Ghee. Bacon fat. Tropical oils, such as coconut, palm kernel, or palm oil. Seasoning and other foods Salted popcorn and pretzels. Onion salt, garlic salt, seasoned salt, table salt, and sea salt. Worcestershire sauce. Tartar sauce. Barbecue sauce. Teriyaki sauce. Soy sauce, including reduced-sodium. Steak sauce. Canned and packaged gravies. Fish sauce. Oyster sauce. Cocktail sauce. Horseradish that you find on the shelf. Ketchup. Mustard. Meat flavorings and tenderizers. Bouillon cubes. Hot sauce and Tabasco sauce. Premade or packaged marinades. Premade or packaged taco seasonings. Relishes. Regular salad dressings. Where to find more information:  National Heart, Lung, and [place]: https://wilson-eaton.com/  American Heart Association: www.heart.org Summary  The DASH eating plan is a healthy eating plan that has been shown to reduce high blood pressure (hypertension). It may also reduce your risk for type 2 diabetes, heart disease, and stroke.  With the DASH eating plan, you should limit salt (sodium) intake to 2,300 mg a day. If you have hypertension, you may need to reduce your sodium intake to 1,500 mg a day.  When on the DASH eating plan, aim to eat more fresh fruits and vegetables, whole  grains, lean proteins, low-fat dairy, and heart-healthy fats.  Work with your health care provider or diet and nutrition specialist (dietitian) to adjust your eating plan to your individual calorie needs. This information is not intended to replace advice given to you by your health care provider. Make sure you discuss any questions you have with your health care provider. Document Released: 04/23/2011 Document Revised: 04/27/2016 Document Reviewed: 04/27/2016 Elsevier  Interactive Patient Education  2018 [employer].  Hypertension Hypertension, commonly called high blood pressure, is when the force of blood pumping through the arteries is too strong. The arteries are the blood vessels that carry blood from the heart throughout the body. Hypertension forces the heart to work harder to pump blood and may cause arteries to become narrow or stiff. Having untreated or uncontrolled hypertension can cause heart attacks, strokes, kidney disease, and other problems. A blood pressure reading consists of a higher number over a lower number. Ideally, your blood pressure should be below 120/80. The first ("top") number is called the systolic pressure. It is a measure of the pressure in your arteries as your heart beats. The second ("bottom") number is called the diastolic pressure. It is a measure of the pressure in your arteries as the heart relaxes. What are the causes? The cause of this condition is not known. What increases the risk? Some risk factors for high blood pressure are under your control. Others are not. Factors you can change  Smoking.  Having type 2 diabetes mellitus, high cholesterol, or both.  Not getting enough exercise or physical activity.  Being overweight.  Having too much fat, sugar, calories, or salt (sodium) in your diet.  Drinking too much alcohol. Factors that are difficult or impossible to change  Having chronic kidney disease.  Having a family history of high blood pressure.  Age. Risk increases with age.  Race. You may be at higher risk if you are African-American.  Gender. Men are at higher risk than women before age 37. After age 22, women are at higher risk than men.  Having obstructive sleep apnea.  Stress. What are the signs or symptoms? Extremely high blood pressure (hypertensive crisis) may cause:  Headache.  Anxiety.  Shortness of breath.  Nosebleed.  Nausea and vomiting.  Severe chest pain.  Jerky  movements you cannot control (seizures).  How is this diagnosed? This condition is diagnosed by measuring your blood pressure while you are seated, with your arm resting on a surface. The cuff of the blood pressure monitor will be placed directly against the skin of your upper arm at the level of your heart. It should be measured at least twice using the same arm. Certain conditions can cause a difference in blood pressure between your right and left arms. Certain factors can cause blood pressure readings to be lower or higher than normal (elevated) for a short period of time:  When your blood pressure is higher when you are in a health care provider's office than when you are at home, this is called white coat hypertension. Most people with this condition do not need medicines.  When your blood pressure is higher at home than when you are in a health care provider's office, this is called masked hypertension. Most people with this condition may need medicines to control blood pressure.  If you have a high blood pressure reading during one visit or you have normal blood pressure with other risk factors:  You may be asked to return on a different  day to have your blood pressure checked again.  You may be asked to monitor your blood pressure at home for 1 week or longer.  If you are diagnosed with hypertension, you may have other blood or imaging tests to help your health care provider understand your overall risk for other conditions. How is this treated? This condition is treated by making healthy lifestyle changes, such as eating healthy foods, exercising more, and reducing your alcohol intake. Your health care provider may prescribe medicine if lifestyle changes are not enough to get your blood pressure under control, and if:  Your systolic blood pressure is above 130.  Your diastolic blood pressure is above 80.  Your personal target blood pressure may vary depending on your medical  conditions, your age, and other factors. Follow these instructions at home: Eating and drinking  Eat a diet that is high in fiber and potassium, and low in sodium, added sugar, and fat. An example eating plan is called the DASH (Dietary Approaches to Stop Hypertension) diet. To eat this way: ? Eat plenty of fresh fruits and vegetables. Try to fill half of your plate at each meal with fruits and vegetables. ? Eat whole grains, such as whole wheat pasta, brown rice, or whole grain bread. Fill about one quarter of your plate with whole grains. ? Eat or drink low-fat dairy products, such as skim milk or low-fat yogurt. ? Avoid fatty cuts of meat, processed or cured meats, and poultry with skin. Fill about one quarter of your plate with lean proteins, such as fish, chicken without skin, beans, eggs, and tofu. ? Avoid premade and processed foods. These tend to be higher in sodium, added sugar, and fat.  Reduce your daily sodium intake. Most people with hypertension should eat less than 1,500 mg of sodium a day.  Limit alcohol intake to no more than 1 drink a day for nonpregnant women and 2 drinks a day for men. One drink equals 12 oz of beer, 5 oz of wine, or 1 oz of hard liquor. Lifestyle  Work with your health care provider to maintain a healthy body weight or to lose weight. Ask what an ideal weight is for you.  Get at least 30 minutes of exercise that causes your heart to beat faster (aerobic exercise) most days of the week. Activities may include walking, swimming, or biking.  Include exercise to strengthen your muscles (resistance exercise), such as pilates or lifting weights, as part of your weekly exercise routine. Try to do these types of exercises for 30 minutes at least 3 days a week.  Do not use any products that contain nicotine or tobacco, such as cigarettes and e-cigarettes. If you need help quitting, ask your health care provider.  Monitor your blood pressure at home as told by  your health care provider.  Keep all follow-up visits as told by your health care provider. This is important. Medicines  Take over-the-counter and prescription medicines only as told by your health care provider. Follow directions carefully. Blood pressure medicines must be taken as prescribed.  Do not skip doses of blood pressure medicine. Doing this puts you at risk for problems and can make the medicine less effective.  Ask your health care provider about side effects or reactions to medicines that you should watch for. Contact a health care provider if:  You think you are having a reaction to a medicine you are taking.  You have headaches that keep coming back (recurring).  You feel  dizzy.  You have swelling in your ankles.  You have trouble with your vision. Get help right away if:  You develop a severe headache or confusion.  You have unusual weakness or numbness.  You feel faint.  You have severe pain in your chest or abdomen.  You vomit repeatedly.  You have trouble breathing. Summary  Hypertension is when the force of blood pumping through your arteries is too strong. If this condition is not controlled, it may put you at risk for serious complications.  Your personal target blood pressure may vary depending on your medical conditions, your age, and other factors. For most people, a normal blood pressure is less than 120/80.  Hypertension is treated with lifestyle changes, medicines, or a combination of both. Lifestyle changes include weight loss, eating a healthy, low-sodium diet, exercising more, and limiting alcohol. This information is not intended to replace advice given to you by your health care provider. Make sure you discuss any questions you have with your health care provider. Document Released: 05/04/2005 Document Revised: 04/01/2016 Document Reviewed: 04/01/2016 Elsevier Interactive Patient Education  2018 [employer]. Use Zeasorb AF Powder daily  in 2 weeks to keep dry Use creams with redness flare for now  Let me know about DEXA and which pneumonia shot 23 vs 13 ?  Increase fiber I.e oatmeal, healthy nuts not salted almonds/walnuts, chia/flax seeds, olive olive   Cholesterol Cholesterol is a white, waxy, fat-like substance that is needed by the human body in small amounts. The liver makes all the cholesterol we need. Cholesterol is carried from the liver by the blood through the blood vessels. Deposits of cholesterol (plaques) may build up on blood vessel (artery) walls. Plaques make the arteries narrower and stiffer. Cholesterol plaques increase the risk for heart attack and stroke. You cannot feel your cholesterol level even if it is very high. The only way to know that it is high is to have a blood test. Once you know your cholesterol levels, you should keep a record of the test results. Work with your health care provider to keep your levels in the desired range. What do the results mean?  Total cholesterol is a rough measure of all the cholesterol in your blood.  LDL (low-density lipoprotein) is the "bad" cholesterol. This is the type that causes plaque to build up on the artery walls. You want this level to be low.  HDL (high-density lipoprotein) is the "good" cholesterol because it cleans the arteries and carries the LDL away. You want this level to be high.  Triglycerides are fat that the body can either burn for energy or store. High levels are closely linked to heart disease. What are the desired levels of cholesterol?  Total cholesterol below 200.  LDL below 100 for people who are at risk, below 70 for people at very high risk.  HDL above 40 is good. A level of 60 or higher is considered to be protective against heart disease.  Triglycerides below 150. How can I lower my cholesterol? Diet Follow your diet program as told by your health care provider.  Choose fish or white meat chicken and [place], roasted or baked.  Limit fatty cuts of red meat, fried foods, and processed meats, such as sausage and lunch meats.  Eat lots of fresh fruits and vegetables.  Choose whole grains, beans, pasta, potatoes, and cereals.  Choose olive oil, corn oil, or canola oil, and use only small amounts.  Avoid butter, mayonnaise, shortening, or palm  kernel oils.  Avoid foods with trans fats.  Drink skim or nonfat milk and eat low-fat or nonfat yogurt and cheeses. Avoid whole milk, cream, ice cream, egg yolks, and full-fat cheeses.  Healthier desserts include angel food cake, ginger snaps, animal crackers, hard candy, popsicles, and low-fat or nonfat frozen yogurt. Avoid pastries, cakes, pies, and cookies.  Exercise  Follow your exercise program as told by your health care provider. A regular program: ? Helps to decrease LDL and raise HDL. ? Helps with weight control.  Do things that increase your activity level, such as gardening, walking, and taking the stairs.  Ask your health care provider about ways that you can be more active in your daily life.  Medicine  Take over-the-counter and prescription medicines only as told by your health care provider. ? Medicine may be prescribed by your health care provider to help lower cholesterol and decrease the risk for heart disease. This is usually done if diet and exercise have failed to bring down cholesterol levels. ? If you have several risk factors, you may need medicine even if your levels are normal.  This information is not intended to replace advice given to you by your health care provider. Make sure you discuss any questions you have with your health care provider. Document Released: 01/27/2001 Document Revised: 11/30/2015 Document Reviewed: 11/02/2015 Elsevier Interactive Patient Education  2018 [employer].   Celesta Aver Intertrigo is skin irritation or inflammation (dermatitis) that occurs when folds of skin rub together. The irritation can cause a rash and  make skin raw and itchy. This condition most commonly occurs in the skin folds of these areas:  Toes.  Armpits.  Groin.  Belly.  Breasts.  Buttocks.  Intertrigo is not passed from person to person (is not contagious). What are the causes? This condition is caused by heat, moisture, friction, and lack of air circulation. The condition can be made worse by:  Sweat.  Bacteria or a fungus, such as yeast.  What increases the risk? This condition is more likely to occur if you have moisture in your skin folds. It is also more likely to develop in people who:  Have diabetes.  Are overweight.  Are on bed rest.  Live in a warm and moist climate.  Wear splints, braces, or other medical devices.  Are not able to control their bowels or bladder (have incontinence).  What are the signs or symptoms? Symptoms of this condition include:  A pink or red skin rash.  Brown patches on the skin.  Raw or scaly skin.  Itchiness.  A burning feeling.  Bleeding.  Leaking fluid.  A bad smell.  How is this diagnosed? This condition is diagnosed with a medical history and physical exam. You may also have a skin swab to test for bacteria or a fungus, such as yeast. How is this treated? Treatment may include:  Cleaning and drying your skin.  An oral antibiotic medicine or antibiotic skin cream for a bacterial infection.  Antifungal cream or pills for an infection that was caused by a fungus, such as yeast.  Steroid ointment to relieve itchiness and irritation.  Follow these instructions at home:  Keep the affected area clean and dry.  Do not scratch your skin.  Stay in a cool environment as much as possible. Use an air conditioner or fan, if available.  Apply over-the-counter and prescription medicines only as told by your health care provider.  If you were prescribed an antibiotic medicine, use it  as told by your health care provider. Do not stop using the antibiotic  even if your condition improves.  Keep all follow-up visits as told by your health care provider. This is important. How is this prevented?  Maintain a healthy weight.  Take care of your feet, especially if you have diabetes. Foot care includes: ? Wearing shoes that fit well. ? Keeping your feet dry. ? Wearing clean, breathable socks.  Protect the skin around your groin and buttocks, especially if you have incontinence. Skin protection includes: ? Following a regular cleaning routine. ? Using moisturizers and skin protectants. ? Changing protection pads frequently.  Do not wear tight clothes. Wear clothes that are loose and absorbent. Wear clothes that are made of cotton.  Wear a bra that gives good support, if needed.  Shower and dry yourself thoroughly after activity. Use a hair dryer on a cool setting to dry between skin folds, especially after you bathe.  If you have diabetes, keep your blood sugar under control. Contact a health care provider if:  Your symptoms do not improve with treatment.  Your symptoms get worse or they spread.  You notice increased redness and warmth.  You have a fever. This information is not intended to replace advice given to you by your health care provider. Make sure you discuss any questions you have with your health care provider. Document Released: 05/04/2005 Document Revised: 10/10/2015 Document Reviewed: 11/05/2014 Elsevier Interactive Patient Education  2018 [employer]

## 2017-12-16 NOTE — Progress Notes (Signed)
Chief Complaint  Patient presents with  . Follow-up    hypertension   F/u  HTN sl elevated on norvasc 2.5 bid and metoprolol xl 50 mg qd  HLD elevated disc healthy diet and exercise today    Review of Systems  Constitutional: Negative for weight loss.  HENT: Negative for hearing loss.   Eyes: Negative for blurred vision.  Respiratory: Negative for shortness of breath.   Cardiovascular: Negative for chest pain.  Skin: Positive for rash.  Neurological: Negative for headaches.  Psychiatric/Behavioral: Negative for depression.   Past Medical History:  Diagnosis Date  . Allergy   . Hypertension    Past Surgical History:  Procedure Laterality Date  . APPENDECTOMY     age [age]  . CARPAL TUNNEL RELEASE     left 2008  . OTHER SURGICAL HISTORY     left great toe fusion 2012    Family History  Problem Relation Age of Onset  . Other Mother        brain tumor died [id number]   Social History   Socioeconomic History  . Marital status: Married    Spouse name: Not on file  . Number of children: Not on file  . Years of education: Not on file  . Highest education level: Not on file  Occupational History  . Not on file  Social Needs  . Financial resource strain: Not on file  . Food insecurity:    Worry: Not on file    Inability: Not on file  . Transportation needs:    Medical: Not on file    Non-medical: Not on file  Tobacco Use  . Smoking status: Never Smoker  . Smokeless tobacco: Never Used  Substance and Sexual Activity  . Alcohol use: No    Frequency: Never  . Drug use: No  . Sexual activity: Never    Partners: Male  Lifestyle  . Physical activity:    Days per week: Not on file    Minutes per session: Not on file  . Stress: Not on file  Relationships  . Social connections:    Talks on phone: Not on file    Gets together: Not on file    Attends religious service: Not on file    Active member of club or organization: Not on file    Attends meetings of clubs or  organizations: Not on file    Relationship status: Not on file  . Intimate partner violence:    Fear of current or ex partner: Not on file    Emotionally abused: Not on file    Physically abused: Not on file    Forced sexual activity: Not on file  Other Topics Concern  . 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 amLODipine (NORVASC) 2.5 MG tablet Take 1 tablet (2.5 mg total) by mouth 2 (two) times daily.  . Carboxymeth-Glyc-Polysorb PF (REFRESH OPTIVE ADVANCED PF) 0.5-1-0.5 % SOLN Apply to eye.  Marland Kitchen. FLUZONE HIGH-DOSE 0.5 ML injection   . metoprolol succinate (TOPROL-XL) 50 MG 24 hr tablet Take 1 tablet (50 mg total) by mouth daily.   Allergies  Allergen Reactions  . Sulfa Antibiotics Itching    Throat tight  . Penicillins Rash   Recent Results (from  the past 2160 hour(s))  Urinalysis, Routine w reflex microscopic     Status: Abnormal   Collection Time: 12/15/17  8:30 AM  Result Value Ref Range   Color, Urine YELLOW Yellow;Lt. Yellow   APPearance CLEAR Clear   Specific Gravity, Urine 1.010 1.000 - 1.030   pH 6.5 5.0 - 8.0   Total Protein, Urine NEGATIVE Negative   Urine Glucose NEGATIVE Negative   Ketones, ur NEGATIVE Negative   Bilirubin Urine NEGATIVE Negative   Hgb urine dipstick NEGATIVE Negative   Urobilinogen, UA 0.2 0.0 - 1.0   Leukocytes, UA NEGATIVE Negative   Nitrite NEGATIVE Negative   WBC, UA 0-2/hpf 0-2/hpf   RBC / HPF 0-2/hpf 0-2/hpf   Mucus, UA Presence of (A) None   Squamous Epithelial / LPF Rare(0-4/hpf) Rare(0-4/hpf)   Bacteria, UA Few(10-50/hpf) (A) None  TSH     Status: None   Collection Time: 12/15/17  8:30 AM  Result Value Ref Range   TSH 1.69 0.35 - 4.50 uIU/mL  Lipid panel     Status: Abnormal   Collection Time: 12/15/17  8:30 AM  Result Value Ref Range    Cholesterol 214 (H) 0 - 200 mg/dL    Comment: ATP III Classification       Desirable:  < 200 mg/dL               Borderline High:  200 - 239 mg/dL          High:  > = 191 mg/dL   Triglycerides 478.2 0.0 - 149.0 mg/dL    Comment: Normal:  <956 mg/dLBorderline High:  150 - 199 mg/dL   HDL 21.30 >86.57 mg/dL   VLDL 84.6 0.0 - 96.2 mg/dL   LDL Cholesterol 952 (H) 0 - 99 mg/dL   Total CHOL/HDL Ratio 3     Comment:                Men          Women1/2 Average Risk     3.4          3.3Average Risk          5.0          4.42X Average Risk          9.6          7.13X Average Risk          15.0          11.0                       NonHDL 150.46     Comment: NOTE:  Non-HDL goal should be 30 mg/dL higher than patient's LDL goal (i.e. LDL goal of < 70 mg/dL, would have non-HDL goal of < 100 mg/dL)  CBC with Differential/Platelet     Status: None   Collection Time: 12/15/17  8:30 AM  Result Value Ref Range   WBC 5.5 4.0 - 10.5 K/uL   RBC 4.87 3.87 - 5.11 Mil/uL   Hemoglobin 14.7 12.0 - 15.0 g/dL   HCT 84.1 32.4 - 40.1 %   MCV 88.3 78.0 - 100.0 fl   MCHC 34.1 30.0 - 36.0 g/dL   RDW 02.7 25.3 - 66.4 %   Platelets 243.0 150.0 - 400.0 K/uL   Neutrophils Relative % 57.0 43.0 - 77.0 %   Lymphocytes Relative 30.8 12.0 - 46.0 %   Monocytes Relative 9.8 3.0 - 12.0 %   Eosinophils Relative  1.9 0.0 - 5.0 %   Basophils Relative 0.5 0.0 - 3.0 %   Neutro Abs 3.2 1.4 - 7.7 K/uL   Lymphs Abs 1.7 0.7 - 4.0 K/uL   Monocytes Absolute 0.5 0.1 - 1.0 K/uL   Eosinophils Absolute 0.1 0.0 - 0.7 K/uL   Basophils Absolute 0.0 0.0 - 0.1 K/uL  Comprehensive metabolic panel     Status: Abnormal   Collection Time: 12/15/17  8:30 AM  Result Value Ref Range   Sodium 141 135 - 145 mEq/L   Potassium 4.5 3.5 - 5.1 mEq/L   Chloride 105 96 - 112 mEq/L   CO2 30 19 - 32 mEq/L   Glucose, Bld 105 (H) 70 - 99 mg/dL   BUN 16 6 - 23 mg/dL   Creatinine, Ser 6.04 0.40 - 1.20 mg/dL   Total Bilirubin 0.6 0.2 - 1.2 mg/dL   Alkaline  Phosphatase 61 39 - 117 U/L   AST 17 0 - 37 U/L   ALT 15 0 - 35 U/L   Total Protein 6.9 6.0 - 8.3 g/dL   Albumin 4.4 3.5 - 5.2 g/dL   Calcium 9.5 8.4 - 54.0 mg/dL   GFR 98.11 >91.47 mL/min   Objective  Body mass index is 28.71 kg/m. Wt Readings from Last 3 Encounters:  12/16/17 137 lb 6 oz (62.3 kg)  11/08/17 134 lb 12.8 oz (61.1 kg)  06/24/17 137 lb (62.1 kg)   Temp Readings from Last 3 Encounters:  12/16/17 97.7 F (36.5 C) (Oral)  11/08/17 98 F (36.7 C) (Oral)  06/24/17 98.1 F (36.7 C) (Oral)   BP Readings from Last 3 Encounters:  12/16/17 (!) 142/80  11/08/17 118/62  06/24/17 130/70   Pulse Readings from Last 3 Encounters:  12/16/17 (!) 51  11/08/17 60  06/24/17 63    Physical Exam  Constitutional: She is oriented to person, place, and time. Vital signs are normal. She appears well-developed and well-nourished. She is cooperative.  HENT:  Head: Normocephalic and atraumatic.  Mouth/Throat: Oropharynx is clear and moist and mucous membranes are normal.  Eyes: Pupils are equal, round, and reactive to light. Conjunctivae are normal.  Cardiovascular: Normal rate, regular rhythm and normal heart sounds.  Pulmonary/Chest: Effort normal and breath sounds normal.  Abdominal:    Neurological: She is alert and oriented to person, place, and time. Gait normal.  Skin: Skin is warm, dry and intact. Rash noted.     Psychiatric: She has a normal mood and affect. Her speech is normal and behavior is normal. Judgment and thought content normal. Cognition and memory are normal.  Nursing note and vitals reviewed.   Assessment   1. HTN/HLD  2. Intertrigo  3. HM Plan   1. Cont meds  Healthy diet and exercise  Fu in 4 months repeat lipid in 4 month s 2. AF topically and powder otc and bactroban call back if not better 3.  Flu had 2019  pna vx had 2016 ? Which one will need to call [facility] in [place] [address]  Tdap per pt had 2013  zostervax had 2015 has not had  shingrix and declines another shingles vaccine had rash with 1st   Last pap had 2017 OB/GYn in [place] need to get records no h/o abnormal pap Dr. Tracey Harries in [place] -obtained records 11/28/15 Dr. Tracey Harries neg pap +atrophy HPV neg  Last colonoscopy 2015 nl per pt need to get records per pt they rec in 10 years no h/o polyps she has  had colonoscopy x 2  Eye exam recently My eye doctor  DEXA last had >10 years ago will think about another  -pt will check cost with insurance then need to refer mammo had 02/2016 per pt need to get records from PCP  -last mammo I can see 03/09/12 neg      mammo 03/09/12 neg fibroglandular breasts US 12/15/10 benign left breast US birads 2, small 9 mm x 5 x 6 mm diameter lymph node in left axilla which does not appear responsible for pts palpable lump  -referred today for mammogram [place]                                Disc hep B/C testing pt declines for now   Pending records from [place] pt will call today to try to get faxed release sent 1 month ago   Ob/gyn records reviewed 11/27/15  Pap 11/28/15 neg pap, neg HPV, +atrophy  Pap 01/25/12 neg pap neg HPV    Provider: Dr. French Ana [name]-[facility]

## 2017-12-31 ENCOUNTER — Other Ambulatory Visit: Payer: Self-pay | Admitting: *Deleted

## 2017-12-31 ENCOUNTER — Inpatient Hospital Stay
Admission: RE | Admit: 2017-12-31 | Discharge: 2017-12-31 | Disposition: A | Discharge status: Home / Self Care | Payer: Self-pay | Source: Ambulatory Visit | Attending: *Deleted | Admitting: *Deleted

## 2017-12-31 DIAGNOSIS — Z9289 Personal history of other medical treatment: Secondary | ICD-10-CM

## 2018-01-05 DIAGNOSIS — R69 Illness, unspecified: Secondary | ICD-10-CM | POA: Diagnosis not present

## 2018-01-18 ENCOUNTER — Ambulatory Visit
Admission: RE | Admit: 2018-01-18 | Discharge: 2018-01-18 | Disposition: A | Discharge status: Home / Self Care | Payer: Medicare HMO | Source: Ambulatory Visit | Attending: Internal Medicine | Admitting: Internal Medicine

## 2018-01-18 ENCOUNTER — Encounter: Payer: Self-pay | Admitting: Radiology

## 2018-01-18 DIAGNOSIS — Z1231 Encounter for screening mammogram for malignant neoplasm of breast: Secondary | ICD-10-CM | POA: Insufficient documentation

## 2018-03-16 DIAGNOSIS — R69 Illness, unspecified: Secondary | ICD-10-CM | POA: Diagnosis not present

## 2018-04-22 ENCOUNTER — Ambulatory Visit: Payer: Medicare HMO | Admitting: Internal Medicine

## 2018-06-10 ENCOUNTER — Ambulatory Visit (INDEPENDENT_AMBULATORY_CARE_PROVIDER_SITE_OTHER): Payer: Medicare HMO | Admitting: Internal Medicine

## 2018-06-10 ENCOUNTER — Encounter: Payer: Self-pay | Admitting: Internal Medicine

## 2018-06-10 VITALS — BP 138/80 | HR 53 | Temp 97.7°F | Resp 16 | Ht 59.0 in | Wt 135.5 lb

## 2018-06-10 DIAGNOSIS — E663 Overweight: Secondary | ICD-10-CM | POA: Insufficient documentation

## 2018-06-10 DIAGNOSIS — R739 Hyperglycemia, unspecified: Secondary | ICD-10-CM

## 2018-06-10 DIAGNOSIS — E559 Vitamin D deficiency, unspecified: Secondary | ICD-10-CM | POA: Diagnosis not present

## 2018-06-10 DIAGNOSIS — E785 Hyperlipidemia, unspecified: Secondary | ICD-10-CM | POA: Diagnosis not present

## 2018-06-10 DIAGNOSIS — I10 Essential (primary) hypertension: Secondary | ICD-10-CM

## 2018-06-10 LAB — CBC WITH DIFFERENTIAL/PLATELET
Basophils Absolute: 0 10*3/uL (ref 0.0–0.1)
Basophils Relative: 0.6 % (ref 0.0–3.0)
Eosinophils Absolute: 0.1 10*3/uL (ref 0.0–0.7)
Eosinophils Relative: 1.8 % (ref 0.0–5.0)
HCT: 42.7 % (ref 36.0–46.0)
Hemoglobin: 14.9 g/dL (ref 12.0–15.0)
Lymphocytes Relative: 33.4 % (ref 12.0–46.0)
Lymphs Abs: 1.7 10*3/uL (ref 0.7–4.0)
MCHC: 35 g/dL (ref 30.0–36.0)
MCV: 85.6 fl (ref 78.0–100.0)
Monocytes Absolute: 0.6 10*3/uL (ref 0.1–1.0)
Monocytes Relative: 11.7 % (ref 3.0–12.0)
Neutro Abs: 2.6 10*3/uL (ref 1.4–7.7)
Neutrophils Relative %: 52.5 % (ref 43.0–77.0)
Platelets: 252 10*3/uL (ref 150.0–400.0)
RBC: 4.98 Mil/uL (ref 3.87–5.11)
RDW: 13.1 % (ref 11.5–15.5)
WBC: 5.1 10*3/uL (ref 4.0–10.5)

## 2018-06-10 LAB — LIPID PANEL
Cholesterol: 225 mg/dL — ABNORMAL HIGH (ref 0–200)
HDL: 63.4 mg/dL (ref >39.00)
LDL Cholesterol: 143 mg/dL — ABNORMAL HIGH (ref 0–99)
NonHDL: 161.25
Total CHOL/HDL Ratio: 4
Triglycerides: 91 mg/dL (ref 0.0–149.0)
VLDL: 18.2 mg/dL (ref 0.0–40.0)

## 2018-06-10 LAB — HEMOGLOBIN A1C: Hgb A1c MFr Bld: 5.5 % (ref 4.6–6.5)

## 2018-06-10 LAB — COMPREHENSIVE METABOLIC PANEL
ALT: 17 U/L (ref 0–35)
AST: 21 U/L (ref 0–37)
Albumin: 4.7 g/dL (ref 3.5–5.2)
Alkaline Phosphatase: 61 U/L (ref 39–117)
BUN: 15 mg/dL (ref 6–23)
CO2: 29 mEq/L (ref 19–32)
Calcium: 9.7 mg/dL (ref 8.4–10.5)
Chloride: 104 mEq/L (ref 96–112)
Creatinine, Ser: 0.71 mg/dL (ref 0.40–1.20)
GFR: 81.59 mL/min (ref >60.00)
Glucose, Bld: 94 mg/dL (ref 70–99)
Potassium: 4.7 mEq/L (ref 3.5–5.1)
Sodium: 141 mEq/L (ref 135–145)
Total Bilirubin: 0.6 mg/dL (ref 0.2–1.2)
Total Protein: 6.9 g/dL (ref 6.0–8.3)

## 2018-06-10 LAB — VITAMIN D 25 HYDROXY (VIT D DEFICIENCY, FRACTURES): VITD: 52.38 ng/mL (ref 30.00–100.00)

## 2018-06-10 MED ORDER — METOPROLOL SUCCINATE ER 25 MG PO TB24: 25.0000 mg | ORAL_TABLET | Freq: Every day | ORAL | 3 refills | Status: DC

## 2018-06-10 NOTE — Progress Notes (Addendum)
Chief Complaint  Patient presents with  . Follow-up    Patient fasting   F/u  1. HTN no meds today toprol xl 50 mg qd, norvasc 2.5 mg bid doing well except asxmatic brady even with exercise HR low  2. HLD changed diet and exercising on ellipitical daily  3. Intertrigo to ab better used gold bond talc free powder and creams  No complaints today    Review of Systems  Constitutional: Positive for weight loss.       Wt loss trying   HENT: Negative for hearing loss.   Eyes: Negative for blurred vision.  Respiratory: Negative for shortness of breath.   Cardiovascular: Negative for chest pain.  Gastrointestinal: Negative for abdominal pain.  Skin: Negative for rash.  Neurological: Negative for headaches.  Psychiatric/Behavioral: Negative for depression.   Past Medical History:  Diagnosis Date  . Allergy   . Hyperlipidemia   . Hypertension    Past Surgical History:  Procedure Laterality Date  . APPENDECTOMY     age [age]  . CARPAL TUNNEL RELEASE     left 2008  . OTHER SURGICAL HISTORY     left great toe fusion 2012    Family History  Problem Relation Age of Onset  . Other Mother        brain tumor died 17   Social History   Socioeconomic History  . Marital status: Married    Spouse name: Not on file  . Number of children: Not on file  . Years of education: Not on file  . Highest education level: Not on file  Occupational History  . Not on file  Social Needs  . Financial resource strain: Not on file  . Food insecurity:    Worry: Not on file    Inability: Not on file  . Transportation needs:    Medical: Not on file    Non-medical: Not on file  Tobacco Use  . Smoking status: Never Smoker  . Smokeless tobacco: Never Used  Substance and Sexual Activity  . Alcohol use: No    Frequency: Never  . Drug use: No  . Sexual activity: Never    Partners: Male  Lifestyle  . Physical activity:    Days per week: Not on file    Minutes per session: Not on file  .  Stress: Not on file  Relationships  . Social connections:    Talks on phone: Not on file    Gets together: Not on file    Attends religious service: Not on file    Active member of club or organization: Not on file    Attends meetings of clubs or organizations: Not on file    Relationship status: Not on file  . Intimate partner violence:    Fear of current or ex partner: Not on file    Emotionally abused: Not on file    Physically abused: Not on file    Forced sexual activity: Not on file  Other Topics Concern  . Not on file  Social History Narrative   Moved from [place] 2018   Mother was [place] she was born in [place] father American soldier never knew    Married    Retired from [employer] officer, political party    Current Meds  Medication Sig  . amLODipine (NORVASC) 2.5 MG tablet Take 1 tablet (2.5 mg total) by mouth 2 (two) times daily.  . Carboxymeth-Glyc-Polysorb PF (REFRESH OPTIVE ADVANCED PF) 0.5-1-0.5 % SOLN Apply to eye.  Marland Kitchen  FLUZONE HIGH-DOSE 0.5 ML injection   . metoprolol succinate (TOPROL-XL) 25 MG 24 hr tablet Take 1 tablet (25 mg total) by mouth daily.  . [DISCONTINUED] metoprolol succinate (TOPROL-XL) 50 MG 24 hr tablet Take 1 tablet (50 mg total) by mouth daily.   Allergies  Allergen Reactions  . Sulfa Antibiotics Itching    Throat tight  . Penicillins Rash   No results found for this or any previous visit (from the past 2160 hour(s)). Objective  Body mass index is 27.37 kg/m. Wt Readings from Last 3 Encounters:  06/10/18 135 lb 8 oz (61.5 kg)  12/16/17 137 lb 6 oz (62.3 kg)  11/08/17 134 lb 12.8 oz (61.1 kg)   Temp Readings from Last 3 Encounters:  06/10/18 97.7 F (36.5 C) (Oral)  12/16/17 97.7 F (36.5 C) (Oral)  11/08/17 98 F (36.7 C) (Oral)   BP Readings from Last 3 Encounters:  06/10/18 138/80  12/16/17 (!) 142/80  11/08/17 118/62   Pulse Readings from Last 3 Encounters:  06/10/18 (!) 53  12/16/17 (!) 51  11/08/17 60    Physical Exam Vitals  signs and nursing note reviewed.  Constitutional:      Appearance: Normal appearance. She is well-developed and overweight.  HENT:     Head: Normocephalic and atraumatic.     Ears:     Comments: Mild wax ears R>L     Mouth/Throat:     Mouth: Mucous membranes are moist.     Pharynx: Oropharynx is clear.  Eyes:     Conjunctiva/sclera: Conjunctivae normal.     Pupils: Pupils are equal, round, and reactive to light.  Cardiovascular:     Rate and Rhythm: Normal rate and regular rhythm.     Heart sounds: Normal heart sounds.  Pulmonary:     Effort: Pulmonary effort is normal.     Breath sounds: Normal breath sounds.  Skin:    General: Skin is warm and dry.  Neurological:     General: No focal deficit present.     Mental Status: She is alert and oriented to person, place, and time. Mental status is at baseline.     Gait: Gait normal.  Psychiatric:        Attention and Perception: Attention and perception normal.        Mood and Affect: Mood and affect normal.        Speech: Speech normal.        Behavior: Behavior normal. Behavior is cooperative.        Thought Content: Thought content normal.        Cognition and Memory: Cognition and memory normal.        Judgment: Judgment normal.     Assessment   1. HTN/HLD 2. Intertrigo improved  3. HM Plan  1. Fasting labs today  Reduce toprol 50 to 25 xl due to bradycardia  Monitor BP f/u in 6 months call back if BP >130/>80 2. Monitor use powder as maintenance gold bond talc free  Prn clotrimazole as needed  3.  Flu had 2019  pna vx had 2016 likley 21 Which one will need to call [facility] [place] [address]  prevnar had 05/17/14 brought proof of vx  Tdap per pt had 2013  zostervax had 2015has not hadshingrix and declines another shingles vaccine had rash with 1st  Last pap 11/28/15 Dr. Barbera Setters neg pap +atrophy HPV neg Last colonoscopy 2015 nl per pt need to get records per pt they rec in  10 years no h/o polyps she has had  colonoscopy x 2  -request Dr. Fayrene Fearing [facility] today signed to get records  -no records received b/c no records   Eye exam recently My eye doctor  DEXAlast had >10 years ago will think about anotherdisc today  mammo 01/18/18 normal  Disc hep B/C/MMR testing pt declines for now  No skin issues today   Provider: Dr. Olivia Mackie [name]-[facility] Medicine

## 2018-06-10 NOTE — Patient Instructions (Signed)
Zeasorb AF powder to keep dry  Debrox ear wax drops   Cholesterol Cholesterol is a white, waxy, fat-like substance that is needed by the human body in small amounts. The liver makes all the cholesterol we need. Cholesterol is carried from the liver by the blood through the blood vessels. Deposits of cholesterol (plaques) may build up on blood vessel (artery) walls. Plaques make the arteries narrower and stiffer. Cholesterol plaques increase the risk for heart attack and stroke. You cannot feel your cholesterol level even if it is very high. The only way to know that it is high is to have a blood test. Once you know your cholesterol levels, you should keep a record of the test results. Work with your health care provider to keep your levels in the desired range. What do the results mean?  Total cholesterol is a rough measure of all the cholesterol in your blood.  LDL (low-density lipoprotein) is the "bad" cholesterol. This is the type that causes plaque to build up on the artery walls. You want this level to be low.  HDL (high-density lipoprotein) is the "good" cholesterol because it cleans the arteries and carries the LDL away. You want this level to be high.  Triglycerides are fat that the body can either burn for energy or store. High levels are closely linked to heart disease. What are the desired levels of cholesterol?  Total cholesterol below 200.  LDL below 100 for people who are at risk, below 70 for people at very high risk.  HDL above 40 is good. A level of 60 or higher is considered to be protective against heart disease.  Triglycerides below 150. How can I lower my cholesterol? Diet Follow your diet program as told by your health care provider.  Choose fish or white meat chicken and [place], roasted or baked. Limit fatty cuts of red meat, fried foods, and processed meats, such as sausage and lunch meats.  Eat lots of fresh fruits and vegetables.  Choose whole grains, beans,  pasta, potatoes, and cereals.  Choose olive oil, corn oil, or canola oil, and use only small amounts.  Avoid butter, mayonnaise, shortening, or palm kernel oils.  Avoid foods with trans fats.  Drink skim or nonfat milk and eat low-fat or nonfat yogurt and cheeses. Avoid whole milk, cream, ice cream, egg yolks, and full-fat cheeses.  Healthier desserts include angel food cake, ginger snaps, animal crackers, hard candy, popsicles, and low-fat or nonfat frozen yogurt. Avoid pastries, cakes, pies, and cookies.  Exercise  Follow your exercise program as told by your health care provider. A regular program: ? Helps to decrease LDL and raise HDL. ? Helps with weight control.  Do things that increase your activity level, such as gardening, walking, and taking the stairs.  Ask your health care provider about ways that you can be more active in your daily life. Medicine  Take over-the-counter and prescription medicines only as told by your health care provider. ? Medicine may be prescribed by your health care provider to help lower cholesterol and decrease the risk for heart disease. This is usually done if diet and exercise have failed to bring down cholesterol levels. ? If you have several risk factors, you may need medicine even if your levels are normal. This information is not intended to replace advice given to you by your health care provider. Make sure you discuss any questions you have with your health care provider. Document Released: 01/27/2001 Document Revised: 11/30/2015 Document Reviewed:  11/02/2015 Elsevier Interactive Patient Education  [employer].

## 2018-07-12 DIAGNOSIS — H5213 Myopia, bilateral: Secondary | ICD-10-CM | POA: Diagnosis not present

## 2018-09-12 ENCOUNTER — Emergency Department
Admission: EM | Admit: 2018-09-12 | Discharge: 2018-09-12 | Disposition: A | Discharge status: Home / Self Care | Payer: Medicare HMO | Attending: Emergency Medicine | Admitting: Emergency Medicine

## 2018-09-12 ENCOUNTER — Ambulatory Visit: Payer: Self-pay | Admitting: *Deleted

## 2018-09-12 ENCOUNTER — Telehealth: Payer: Self-pay | Admitting: Internal Medicine

## 2018-09-12 ENCOUNTER — Emergency Department: Payer: Medicare HMO

## 2018-09-12 ENCOUNTER — Other Ambulatory Visit: Payer: Self-pay

## 2018-09-12 DIAGNOSIS — Z79899 Other long term (current) drug therapy: Secondary | ICD-10-CM | POA: Insufficient documentation

## 2018-09-12 DIAGNOSIS — R079 Chest pain, unspecified: Secondary | ICD-10-CM | POA: Diagnosis not present

## 2018-09-12 DIAGNOSIS — E785 Hyperlipidemia, unspecified: Secondary | ICD-10-CM | POA: Insufficient documentation

## 2018-09-12 DIAGNOSIS — R0789 Other chest pain: Secondary | ICD-10-CM

## 2018-09-12 DIAGNOSIS — I10 Essential (primary) hypertension: Secondary | ICD-10-CM | POA: Insufficient documentation

## 2018-09-12 LAB — BASIC METABOLIC PANEL
Anion gap: 7 (ref 5–15)
BUN: 15 mg/dL (ref 8–23)
CO2: 26 mmol/L (ref 22–32)
Calcium: 9.2 mg/dL (ref 8.9–10.3)
Chloride: 106 mmol/L (ref 98–111)
Creatinine, Ser: 0.7 mg/dL (ref 0.44–1.00)
GFR calc Af Amer: >60 mL/min (ref >60)
GFR calc non Af Amer: >60 mL/min (ref >60)
Glucose, Bld: 104 mg/dL — ABNORMAL HIGH (ref 70–99)
Potassium: 3.6 mmol/L (ref 3.5–5.1)
Sodium: 139 mmol/L (ref 135–145)

## 2018-09-12 LAB — CBC
HCT: 44.3 % (ref 36.0–46.0)
Hemoglobin: 15.5 g/dL — ABNORMAL HIGH (ref 12.0–15.0)
MCH: 29.5 pg (ref 26.0–34.0)
MCHC: 35 g/dL (ref 30.0–36.0)
MCV: 84.4 fL (ref 80.0–100.0)
Platelets: 245 10*3/uL (ref 150–400)
RBC: 5.25 MIL/uL — ABNORMAL HIGH (ref 3.87–5.11)
RDW: 12 % (ref 11.5–15.5)
WBC: 6.5 10*3/uL (ref 4.0–10.5)
nRBC: 0 % (ref 0.0–0.2)

## 2018-09-12 LAB — TROPONIN I
Troponin I: <0.03 ng/mL (ref <0.03)
Troponin I: <0.03 ng/mL (ref <0.03)

## 2018-09-12 MED ORDER — SODIUM CHLORIDE 0.9% FLUSH: 3.0000 mL | Freq: Once | INTRAVENOUS | Status: DC

## 2018-09-12 NOTE — ED Triage Notes (Signed)
Pt states she has substernal chest pain Thursday night, felt like her heart was pounding like a panic attack, fine Friday and on Saturday and felt some palpitations and had some high blood pressure. Denies any pain today.

## 2018-09-12 NOTE — ED Notes (Signed)

## 2018-09-12 NOTE — Telephone Encounter (Signed)
Attempted to contact pt; no answer at [phone]; notified Brock at [facility]; he states that they will attempt to contact the pt.

## 2018-09-12 NOTE — Telephone Encounter (Signed)
Spoken to patient.  She has been having Chest pain since Thursday. Pain has become worsen as this weekend went by. She stated her BP was 150-190/ 86-95 this whole weekend, profuse sweating, localized chest px, px worsens when taking breaths, Curtently very dull px.  She denies any arm, jaw, SOB, and dizziness.  Instructed to go to ED, patient stated she will comply and husband is driving her.

## 2018-09-12 NOTE — ED Provider Notes (Signed)
[facility] Emergency Department Provider Note ____________________________________________   First MD Initiated Contact with Patient 09/12/18 1044     (approximate)  I have reviewed the triage vital signs and the nursing notes.   HISTORY  Chief Complaint Chest Pain    HPI Erica Valencia is a 68 y.o. female with PMH as noted below (but no prior cardiac history) who presents with chest pain, described as a squeezing, substernal, nonradiating.  It initially started 3 days ago and has mostly subsided since then.  It is not exertional.  She reports mild associated nausea and occasionally some diaphoresis but no lightheadedness or shortness of breath.  She has no fever or cough.  No prior history of this pain.  The patient states that she has been feeling more anxious recently due to the coronavirus pandemic, and initially attributed the pain to this.  She also states her blood pressure has been fluctuating from around 140 to 180 systolic when she has checked it.  Past Medical History:  Diagnosis Date  . Allergy   . Hyperlipidemia   . Hypertension     Patient Active Problem List   Diagnosis Date Noted  . Overweight (BMI 25.0-29.9) 06/10/2018  . Hyperlipidemia 12/16/2017  . HTN (hypertension) 05/27/2017    Past Surgical History:  Procedure Laterality Date  . APPENDECTOMY     age [age]  . CARPAL TUNNEL RELEASE     left 2008  . OTHER SURGICAL HISTORY     left great toe fusion 2012     Prior to Admission medications   Medication Sig Start Date End Date Taking? Authorizing Provider  amLODipine (NORVASC) 2.5 MG tablet Take 1 tablet (2.5 mg total) by mouth 2 (two) times daily. 12/16/17  Yes [name], Pasty Spillersracy N, MD  metoprolol succinate (TOPROL-XL) 50 MG 24 hr tablet Take 50 mg by mouth daily. 09/03/18  Yes [provider]  Multiple Vitamin (MULTIVITAMIN) tablet Take 1 tablet by mouth daily.   Yes [provider]    Allergies Sulfa  antibiotics and Penicillins  Family History  Problem Relation Age of Onset  . Other Mother        brain tumor died 3253    Social History Social History   Tobacco Use  . Smoking status: Never Smoker  . Smokeless tobacco: Never Used  Substance Use Topics  . Alcohol use: No    Frequency: Never  . Drug use: No    Review of Systems  Constitutional: No fever/chills. Eyes: No redness. ENT: No sore throat. Cardiovascular: Positive for chest pain. Respiratory: Denies shortness of breath. Gastrointestinal: No vomiting or diarrhea.  Genitourinary: Negative for flank pain.  Musculoskeletal: Negative for back pain. Skin: Negative for rash. Neurological: Negative for headache.   ____________________________________________   PHYSICAL EXAM:  VITAL SIGNS: ED Triage Vitals  Enc Vitals Group     BP 09/12/18 0953 (!) 153/72     Pulse Rate 09/12/18 0953 63     Resp 09/12/18 1103 18     Temp 09/12/18 0953 98.4 F (36.9 C)     Temp Source 09/12/18 0953 Oral     SpO2 09/12/18 0953 98 %     Weight 09/12/18 0954 133 lb (60.3 kg)     Height 09/12/18 0954 5' (1.524 m)     Head Circumference --      Peak Flow --      Pain Score 09/12/18 1000 0     Pain Loc --  Pain Edu? --      Excl. in GC? --     Constitutional: Alert and oriented.  Relatively well appearing and in no acute distress. Eyes: Conjunctivae are normal.  Head: Atraumatic. Nose: No congestion/rhinnorhea. Mouth/Throat: Mucous membranes are moist.   Neck: Normal range of motion.  Cardiovascular: Normal rate, regular rhythm. ood peripheral circulation. Respiratory: Normal respiratory effort.  No retractions.  Gastrointestinal: No distention.  Musculoskeletal: No lower extremity edema.  Extremities warm and well perfused.  Neurologic:  Normal speech and language. No gross focal neurologic deficits are appreciated.  Skin:  Skin is warm and dry. No rash noted. Psychiatric: Mood and affect are normal. Speech and  behavior are normal.  ____________________________________________   LABS (all labs ordered are listed, but only abnormal results are displayed)  Labs Reviewed  BASIC METABOLIC PANEL - Abnormal; Notable for the following components:      Result Value   Glucose, Bld 104 (*)    All other components within normal limits  CBC - Abnormal; Notable for the following components:   RBC 5.25 (*)    Hemoglobin 15.5 (*)    All other components within normal limits  TROPONIN I  TROPONIN I   ____________________________________________  EKG  ED ECG REPORT I, Dionne Bucy, the attending physician, personally viewed and interpreted this ECG.  Date: 09/12/2018 EKG Time: 0951 Rate: 63 Rhythm: normal sinus rhythm QRS Axis: normal Intervals: normal ST/T Wave abnormalities: normal Narrative Interpretation: no evidence of acute ischemia  ____________________________________________  RADIOLOGY  CXR: No focal infiltrate or other acute abnormality  ____________________________________________   PROCEDURES  Procedure(s) performed: No  Procedures  Critical Care performed: No ____________________________________________   INITIAL IMPRESSION / ASSESSMENT AND PLAN / ED COURSE  Pertinent labs & imaging results that were available during my care of the patient were reviewed by me and considered in my medical decision making (see chart for details).  68 year old female with PMH as noted above but no cardiac history presents with atypical and nonexertional chest pain over the last 3 days which has mostly subsided.  It is been associated with some palpitations as well as diaphoresis.  The patient reports nausea but no lightheadedness or shortness of breath.  She has no cough, fever, or other infectious symptoms.  I reviewed the past medical records in Epic.  Patient has no recent prior ED visits or admissions.  On exam, the patient is well-appearing.  Her vital signs are normal  except for hypertension.  The remainder of the exam is unremarkable.  Her EKG is nonischemic.  Overall I suspect most likely symptoms related to anxiety, versus musculoskeletal pain.  Although the patient has had some elevated blood pressures over the weekend there is no evidence of endorgan dysfunction or hypertensive emergency.  I have a low suspicion for ACS.  There are no clinical signs or symptoms to suggest PE, aortic dissection, or other vascular cause.  We will obtain basic labs, cardiac enzymes x2, and reassess.  ----------------------------------------- 1:01 PM on 09/12/2018 -----------------------------------------  Initial and repeat troponins were both negative.  Given the duration since the onset of the pain, the patient does not need to stay for prolonged observation.  At this time, she is stable for discharge.  The blood pressure is much improved without intervention.  I counseled the patient on the results of the work-up and the plan of care.  She agrees to follow-up with her regular doctor.  Return precautions given, and she expresses understanding. ____________________________________________   FINAL  CLINICAL IMPRESSION(S) / ED DIAGNOSES  Final diagnoses:  Atypical chest pain      NEW MEDICATIONS STARTED DURING THIS VISIT:  New Prescriptions   No medications on file     Note:  This document was prepared using Dragon voice recognition software and may include unintentional dictation errors.    Dionne Bucy, MD 09/12/18 1302

## 2018-09-12 NOTE — Telephone Encounter (Addendum)
Attempted to contact pt; left message on voicemail [phone]; unable to complete nurse.

## 2018-09-12 NOTE — Telephone Encounter (Signed)
Agree with ED   TMS 

## 2018-09-12 NOTE — Telephone Encounter (Signed)
Sarah from [facility] called stating that the pt was having chest pain and sweating; unable to connect call; will attempt to contact the pt.

## 2018-09-12 NOTE — Discharge Instructions (Addendum)
Return to the ER for new, worsening, or persistent severe chest pain, difficulty breathing, palpitations, weakness, or any other new or worsening symptoms that concern you.  You can keep a logbook of your blood pressures a few times per day.  Call your doctor to schedule a follow-up visit within the next 1 to 2 weeks.

## 2018-09-12 NOTE — Telephone Encounter (Signed)
pls call pt and sch virtual or telephone for ED f/u   Thanks TMS

## 2018-09-13 ENCOUNTER — Ambulatory Visit (INDEPENDENT_AMBULATORY_CARE_PROVIDER_SITE_OTHER): Payer: Medicare HMO | Admitting: Internal Medicine

## 2018-09-13 DIAGNOSIS — D751 Secondary polycythemia: Secondary | ICD-10-CM | POA: Diagnosis not present

## 2018-09-13 DIAGNOSIS — R079 Chest pain, unspecified: Secondary | ICD-10-CM

## 2018-09-13 DIAGNOSIS — E785 Hyperlipidemia, unspecified: Secondary | ICD-10-CM | POA: Diagnosis not present

## 2018-09-13 DIAGNOSIS — Z1231 Encounter for screening mammogram for malignant neoplasm of breast: Secondary | ICD-10-CM

## 2018-09-13 DIAGNOSIS — R001 Bradycardia, unspecified: Secondary | ICD-10-CM

## 2018-09-13 DIAGNOSIS — I10 Essential (primary) hypertension: Secondary | ICD-10-CM

## 2018-09-13 MED ORDER — TELMISARTAN 20 MG PO TABS: 20.0000 mg | ORAL_TABLET | Freq: Every day | ORAL | 3 refills | Status: DC

## 2018-09-13 MED ORDER — METOPROLOL SUCCINATE ER 25 MG PO TB24: 25.0000 mg | ORAL_TABLET | Freq: Every day | ORAL | 3 refills | Status: DC

## 2018-09-13 NOTE — Progress Notes (Signed)
Virtual Visit via Video Note 09/13/18 via Doxy  I connected with Erica Valencia  on 09/13/18 at  4:20 PM EDT by a video enabled telemedicine application and verified that I am speaking with the correct person using two identifiers.  Location patient: home Location provider:work Persons participating in the virtual visit: patient, provider  I discussed the limitations of evaluation and management by telemedicine and the availability of in person appointments. The patient expressed understanding and agreed to proceed.   HPI: 1. HTN elevated in ED 154 sbp after meds and was having CP so she took 2 Aspirin 81 mg BP got as high as 179/88 and HR 58 so she went to the ED 09/12/2018. He is on toprol XL 50 mg qd and norvasc 2.5 mg bid  Today around 3 pm 09/13/2018 BP was 140/79 and HR 63   2. Chest pain felt like pressure/pinch 09/13/2018 and x last 3 days felt like a squeezing sensation denies sob currently but had sob with lying flat Trop negative x 2 in the ED bp trended down in the ED and EKG wnl and CXR normal. She denies GERD though reports spicy foods trigger GERD but no spicy foods w/in the last 3 days last stress test was in 1995 and last echo in 2017 in [place].    ROS: See pertinent positives and negatives per HPI.  Past Medical History:  Diagnosis Date  . Allergy   . Hyperlipidemia   . Hypertension     Past Surgical History:  Procedure Laterality Date  . APPENDECTOMY     age [age]  . CARPAL TUNNEL RELEASE     left 2008  . OTHER SURGICAL HISTORY     left great toe fusion 2012     Family History  Problem Relation Age of Onset  . Other Mother        brain tumor died 49    SOCIAL HX: married    Current Outpatient Medications:  .  metoprolol succinate (TOPROL-XL) 25 MG 24 hr tablet, Take 1 tablet (25 mg total) by mouth daily., Disp: 90 tablet, Rfl: 3 .  Multiple Vitamin (MULTIVITAMIN) tablet, Take 1 tablet by mouth daily., Disp: , Rfl:  .  telmisartan (MICARDIS) 20 MG  tablet, Take 1 tablet (20 mg total) by mouth daily., Disp: 90 tablet, Rfl: 3  EXAM:  VITALS per patient if applicable:  GENERAL: alert, oriented, appears well and in no acute distress  HEENT: atraumatic, conjunttiva clear, no obvious abnormalities on inspection of external nose and ears  NECK: normal movements of the head and neck  LUNGS: on inspection no signs of respiratory distress, breathing rate appears normal, no obvious gross SOB, gasping or wheezing  CV: no obvious cyanosis  MS: moves all visible extremities without noticeable abnormality  PSYCH/NEURO: pleasant and cooperative, no obvious depression or anxiety, speech and thought processing grossly intact  ASSESSMENT AND PLAN:  Discussed the following assessment and plan:  Essential hypertension - Plan: metoprolol succinate (TOPROL-XL) 25 MG 24 hr tablet reduce from 50 mg xl, telmisartan (MICARDIS) 20 MG tablet, Ambulatory referral to Cardiology consider echo and stress test for chest pain  Stop norvasc 2.5 mg bid   Hyperlipidemia, unspecified hyperlipidemia type - Plan: Ambulatory referral to Cardiology Recheck lipid   Chest pain, 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 Barbera Setters neg pap +atrophy HPV neg Last colonoscopy 2015 nl per pt need to get records per pt they rec in 10 years no h/o polyps she has had colonoscopy x 2  -request Dr. Fayrene Fearing [facility] today signed to get records  -no records received b/c no records   Eye exam recently My eye doctor  DEXAlast had >10 years ago  will think about anotherdisc prev  mammo 01/18/18 normal  Disc hep B/C/MMR testing pt declines for now  No skin issues today     I discussed the assessment and treatment plan with the patient. The patient was provided an opportunity to ask questions and all were answered. The patient agreed with the plan and demonstrated an understanding of the instructions.   The patient was advised to call back or seek an in-person evaluation if the symptoms worsen or if the condition fails to improve as anticipated.  Time spent 25 minutes Delorise Jackson, MD

## 2018-09-13 NOTE — Telephone Encounter (Signed)
Patient scheduled for today

## 2018-09-14 ENCOUNTER — Telehealth: Payer: Self-pay | Admitting: Cardiovascular Disease

## 2018-09-14 NOTE — Telephone Encounter (Signed)
Virtual Visit Pre-Appointment Phone Call  "(Name), I am calling you today to discuss your upcoming appointment. We are currently trying to limit exposure to the virus that causes COVID-19 by seeing patients at home rather than in the office."  1. "What is the BEST phone number to call the day of the visit?" - include this in appointment notes  2. Do you have or have access to (through a family member/friend) a smartphone with video capability that we can use for your visit?" a. If yes - list this number in appt notes as cell (if different from BEST phone #) and list the appointment type as a VIDEO visit in appointment notes b. If no - list the appointment type as a PHONE visit in appointment notes  3. Confirm consent - "In the setting of the current Covid19 crisis, you are scheduled for a (phone or video) visit with your provider on (date) at (time).  Just as we do with many in-office visits, in order for you to participate in this visit, we must obtain consent.  If you'd like, I can send this to your mychart (if signed up) or email for you to review.  Otherwise, I can obtain your verbal consent now.  All virtual visits are billed to your insurance company just like a normal visit would be.  By agreeing to a virtual visit, we'd like you to understand that the technology does not allow for your provider to perform an examination, and thus may limit your provider's ability to fully assess your condition. If your provider identifies any concerns that need to be evaluated in person, we will make arrangements to do so.  Finally, though the technology is pretty good, we cannot assure that it will always work on either your or our end, and in the setting of a video visit, we may have to convert it to a phone-only visit.  In either situation, we cannot ensure that we have a secure connection.  Are you willing to proceed?" STAFF: Did the patient verbally acknowledge consent to telehealth visit? Document  YES/NO here: Yes   4. Advise patient to be prepared - "Two hours prior to your appointment, go ahead and check your blood pressure, pulse, oxygen saturation, and your weight (if you have the equipment to check those) and write them all down. When your visit starts, your provider will ask you for this information. If you have an Apple Watch or Kardia device, please plan to have heart rate information ready on the day of your appointment. Please have a pen and paper handy nearby the day of the visit as well."  5. Give patient instructions for MyChart download to smartphone OR [email or web address] as below if video visit (depending on what platform provider is using)  6. Inform patient they will receive a phone call 15 minutes prior to their appointment time (may be from unknown caller ID) so they should be prepared to answer    TELEPHONE CALL NOTE  Erica Valencia has been deemed a candidate for a follow-up tele-health visit to limit community exposure during the Covid-19 pandemic. I spoke with the patient via phone to ensure availability of phone/video source, confirm preferred email & phone number, and discuss instructions and expectations.  I reminded Erica Valencia to be prepared with any vital sign and/or heart rhythm information that could potentially be obtained via home monitoring, at the time of her visit. I reminded Erica Valencia to expect a phone call prior to her visit.  Erica Valencia 09/14/2018 10:50 AM   INSTRUCTIONS FOR DOWNLOADING THE MYCHART APP TO SMARTPHONE  - The patient must first make sure to have activated MyChart and know their login information - If Apple, go to [employer] and type in MyChart in the search bar and download the app. If Android, ask patient to go to [employer] and type in [place] in the search bar and download the app. The app is free but as with any other app downloads, their phone may require them to verify saved payment information or Apple/Android  password.  - The patient will need to then log into the app with their MyChart username and password, and select [facility] as their healthcare provider to link the account. When it is time for your visit, go to the MyChart app, find appointments, and click Begin Video Visit. Be sure to Select Allow for your device to access the Microphone and Camera for your visit. You will then be connected, and your provider will be with you shortly.  **If they have any issues connecting, or need assistance please contact MyChart service desk [phone])**  **If using a computer, in order to ensure the best quality for their visit they will need to use either of the following Internet Browsers: [employer] or Google Chrome**  IF USING DOXIMITY or DOXY.ME - The patient will receive a link just prior to their visit by text.     FULL LENGTH CONSENT FOR TELE-HEALTH VISIT   I hereby voluntarily request, consent and authorize [facility] and its employed or contracted physicians, physician assistants, nurse practitioners or other licensed health care professionals (the Practitioner), to provide me with telemedicine health care services (the Services") as deemed necessary by the treating Practitioner. I acknowledge and consent to receive the Services by the Practitioner via telemedicine. I understand that the telemedicine visit will involve communicating with the Practitioner through live audiovisual communication technology and the disclosure of certain medical information by electronic transmission. I acknowledge that I have been given the opportunity to request an in-person assessment or other available alternative prior to the telemedicine visit and am voluntarily participating in the telemedicine visit.  I understand that I have the right to withhold or withdraw my consent to the use of telemedicine in the course of my care at any time, without affecting my right to future care or treatment,  and that the Practitioner or I may terminate the telemedicine visit at any time. I understand that I have the right to inspect all information obtained and/or recorded in the course of the telemedicine visit and may receive copies of available information for a reasonable fee.  I understand that some of the potential risks of receiving the Services via telemedicine include:   Delay or interruption in medical evaluation due to technological equipment failure or disruption;  Information transmitted may not be sufficient (e.g. poor resolution of images) to allow for appropriate medical decision making by the Practitioner; and/or   In rare instances, security protocols could fail, causing a breach of personal health information.  Furthermore, I acknowledge that it is my responsibility to provide information about my medical history, conditions and care that is complete and accurate to the best of my ability. I acknowledge that Practitioner's advice, recommendations, and/or decision may be based on factors not within their control, such as incomplete or inaccurate data provided by me or distortions of diagnostic images or specimens that may result from electronic transmissions. I understand that the practice  of medicine is not an Chief Strategy Officer and that Practitioner makes no warranties or guarantees regarding treatment outcomes. I acknowledge that I will receive a copy of this consent concurrently upon execution via email to the email address I last provided but may also request a printed copy by calling the office of [place].    I understand that my insurance will be billed for this visit.   I have read or had this consent read to me.  I understand the contents of this consent, which adequately explains the benefits and risks of the Services being provided via telemedicine.   I have been provided ample opportunity to ask questions regarding this consent and the Services and have had my questions  answered to my satisfaction.  I give my informed consent for the services to be provided through the use of telemedicine in my medical care  By participating in this telemedicine visit I agree to the above.

## 2018-09-18 DIAGNOSIS — R079 Chest pain, unspecified: Secondary | ICD-10-CM

## 2018-09-18 DIAGNOSIS — R0789 Other chest pain: Principal | ICD-10-CM

## 2018-09-18 HISTORY — DX: Chest pain, unspecified: R07.9

## 2018-09-18 NOTE — Progress Notes (Signed)
Virtual Visit via Video Note   This visit type was conducted due to national recommendations for restrictions regarding the COVID-19 Pandemic (e.g. social distancing) in an effort to limit this patient's exposure and mitigate transmission in our community.  Due to her co-morbid illnesses, this patient is at least at moderate risk for complications without adequate follow up.  This format is felt to be most appropriate for this patient at this time.  All issues noted in this document were discussed and addressed.  A limited physical exam was performed with this format.  Please refer to the patient's chart for her consent to telehealth for [facility].   I connected with  [name] on 09/20/18 by a video enabled telemedicine application and verified that I am speaking with the correct person using two identifiers. I discussed the limitations of evaluation and management by telemedicine. The patient expressed understanding and agreed to proceed.   Evaluation Performed:  Follow-up visit  Date:  09/20/2018   ID:  Erica Valencia, DOB [birth year], MRN [id number]  Patient Location:  [address] [place] [address]   Provider location:   [facility], [place] office  PCP:  [name], [name], MD  Cardiologist:  Fonnie Mu   Chief Complaint:  Chest pain  New patient   History of Present Illness:    Erica Valencia is a 68 y.o. female who presents via audio/video conferencing for a telehealth visit today.   The patient does not symptoms concerning for COVID-19 infection (fever, chills, cough, or new SHORTNESS OF BREATH).   Patient has a past medical history of Hyperlipidemia Hypertension Referred by primary care for chest pain  Thursday night, left side chest pain, 09/08/2018 179/88, constant pain Next day, some chest pain Weekend was ok, Monday, similar pain Maybe better with ASA Better with movement Could not lay down, better sitting up No  change with palpation or breathing  Seen in the emergency room September 12, 2018 for chest pain, anxiety described as a squeezing, substernal, nonradiating.  It initially started 3 days ago and has mostly subsided since then.  It is not exertional.   mild associated nausea and occasionally some diaphoresis but no lightheadedness or shortness of breath.   more anxious recently due to the coronavirus pandemic, and initially attributed the pain to this.  She also states her blood pressure has been fluctuating from around 140 to 180 systolic   Cardiac enzymes negative in the emergency room Diagnosed with atypical chest pain  Seen by primary care the next day Beta-blocker dose decreased secondary to bradycardia, down to 25 daily Off norvasc 2.5 Started on micardis 20 daily  Before starting micardis 123/75, pulse 64  Started micardis,   No chest pain Better with exercise Can not lay on left side to sleep,   HBA1C 5.5 Total chol 225 LDL 143   Prior CV studies:   The following studies were reviewed today:  last stress test was in 1995 and last echo in 2017 in [place].    Past Medical History:  Diagnosis Date  . Allergy   . Hyperlipidemia   . Hypertension    Past Surgical History:  Procedure Laterality Date  . APPENDECTOMY     age [age]  . CARPAL TUNNEL RELEASE     left 2008  . OTHER SURGICAL HISTORY     left great toe fusion 2012      No outpatient medications have been marked as taking for the 09/20/18  encounter (Telemedicine) with Antonieta Iba, MD.     Allergies:   Sulfa antibiotics and Penicillins   Social History   Tobacco Use  . Smoking status: Never Smoker  . Smokeless tobacco: Never Used  Substance Use Topics  . Alcohol use: No    Frequency: Never  . Drug use: No     Current Outpatient Medications on File Prior to Visit  Medication Sig Dispense Refill  . metoprolol succinate (TOPROL-XL) 25 MG 24 hr tablet Take 1 tablet (25 mg total) by mouth  daily. 90 tablet 3  . Multiple Vitamin (MULTIVITAMIN) tablet Take 1 tablet by mouth daily.    Marland Kitchen telmisartan (MICARDIS) 20 MG tablet Take 1 tablet (20 mg total) by mouth daily. 90 tablet 3   No current facility-administered medications on file prior to visit.      Family Hx: The patient's family history includes Other in her mother.  ROS:   Please see the history of present illness.    Review of Systems  Constitutional: Negative.   Respiratory: Negative.   Cardiovascular: Positive for chest pain.  Gastrointestinal: Negative.   Musculoskeletal: Negative.   Neurological: Negative.   Psychiatric/Behavioral: Negative.   All other systems reviewed and are negative.     Labs/Other Tests and Data Reviewed:    Recent Labs: 12/15/2017: TSH 1.69 06/10/2018: ALT 17 09/12/2018: BUN 15; Creatinine, Ser 0.70; Hemoglobin 15.5; Platelets 245; Potassium 3.6; Sodium 139   Recent Lipid Panel Lab Results  Component Value Date/Time   CHOL 225 (H) 06/10/2018 08:28 AM   TRIG 91.0 06/10/2018 08:28 AM   HDL 63.40 06/10/2018 08:28 AM   CHOLHDL 4 06/10/2018 08:28 AM   LDLCALC 143 (H) 06/10/2018 08:28 AM    Wt Readings from Last 3 Encounters:  09/12/18 133 lb (60.3 kg)  06/10/18 135 lb 8 oz (61.5 kg)  12/16/17 137 lb 6 oz (62.3 kg)     Exam:    Vital Signs: Vital signs may also be detailed in the HPI There were no vitals taken for this visit.  Wt Readings from Last 3 Encounters:  09/12/18 133 lb (60.3 kg)  06/10/18 135 lb 8 oz (61.5 kg)  12/16/17 137 lb 6 oz (62.3 kg)   Temp Readings from Last 3 Encounters:  09/12/18 98.4 F (36.9 C) (Oral)  06/10/18 97.7 F (36.5 C) (Oral)  12/16/17 97.7 F (36.5 C) (Oral)   BP Readings from Last 3 Encounters:  09/12/18 138/71  06/10/18 138/80  12/16/17 (!) 142/80   Pulse Readings from Last 3 Encounters:  09/12/18 68  06/10/18 (!) 53  12/16/17 (!) 51     Well nourished, well developed female in no acute distress. Constitutional:  oriented  to person, place, and time. No distress.  Head: Normocephalic and atraumatic.  Eyes:  no discharge. No scleral icterus.  Neck: Normal range of motion. Neck supple.  Pulmonary/Chest: No audible wheezing, no distress, appears comfortable Musculoskeletal: Normal range of motion.  no  tenderness or deformity.  Neurological:   Coordination normal. Full exam not performed Skin:  No rash Psychiatric:  normal mood and affect. behavior is normal. Thought content normal.    ASSESSMENT & PLAN:    Chest pain of uncertain etiology Atypical chest pain, presenting at rest Worse with supine position better sitting up and even better with standing No significant reproducible pain on exertion Likely musculoskeletal, pleuritic, treated with NSAIDs if it comes back She has not had any recent episodes We did discuss CT coronary calcium scoring  for risk stratification if she has recurrent symptoms She does have high cholesterol, prefers not to take a statin  Essential hypertension Blood pressure well controlled Wonders if she needs to take the Micardis which we have recommended she stay on this as systolic ranging from 120 up to 130s Discussed risk factor modification, lifestyle changes, diet, weight loss  Hyperlipidemia, unspecified hyperlipidemia type Prefers not to take a statin We did talk about Zetia, she will think about it, prefers to lose weight and exercise Discussed CT coronary calcium scoring if she wanted to for risk stratification and determining if more aggressive lipid management is needed    COVID-19 Education: The signs and symptoms of COVID-19 were discussed with the patient and how to seek care for testing (follow up with PCP or arrange E-visit).  The importance of social distancing was discussed today.  Patient Risk:   After full review of this patients clinical status, I feel that they are at least moderate risk at this time.  Time:   Today, I have spent 45 minutes with the  patient with telehealth technology discussing the cardiac and medical problems/diagnoses detailed above   10 min spent reviewing the chart prior to patient visit today   Medication Adjustments/Labs and Tests Ordered: Current medicines are reviewed at length with the patient today.  Concerns regarding medicines are outlined above.   Tests Ordered: No tests ordered   Medication Changes: No changes made   Disposition: Follow-up in 6 months   Signed, Julien Nordmannimothy Ivorie Uplinger, MD  09/20/2018 12:46 PM    [facility] [place] [facility] [address], [place], [place] [id number]

## 2018-09-20 ENCOUNTER — Other Ambulatory Visit: Payer: Self-pay

## 2018-09-20 ENCOUNTER — Telehealth (INDEPENDENT_AMBULATORY_CARE_PROVIDER_SITE_OTHER): Payer: Medicare HMO | Admitting: Cardiovascular Disease

## 2018-09-20 DIAGNOSIS — E785 Hyperlipidemia, unspecified: Secondary | ICD-10-CM

## 2018-09-20 DIAGNOSIS — I10 Essential (primary) hypertension: Secondary | ICD-10-CM

## 2018-09-20 DIAGNOSIS — R0789 Other chest pain: Secondary | ICD-10-CM

## 2018-09-20 DIAGNOSIS — E663 Overweight: Secondary | ICD-10-CM | POA: Diagnosis not present

## 2018-09-20 DIAGNOSIS — R079 Chest pain, unspecified: Secondary | ICD-10-CM

## 2018-09-20 NOTE — Patient Instructions (Addendum)
If you would like a CT coronary calcium score, please call the office   Medication Instructions:  No changes  If you need a refill on your cardiac medications before your next appointment, please call your pharmacy.    Lab work: No new labs needed   If you have labs (blood work) drawn today and your tests are completely normal, you will receive your results only by: Marland Kitchen MyChart Message (if you have MyChart) OR . A paper copy in the mail If you have any lab test that is abnormal or we need to change your treatment, we will call you to review the results.   Testing/Procedures: No new testing needed   Follow-Up: At [facility], you and your health needs are our priority.  As part of our continuing mission to provide you with exceptional heart care, we have created designated Provider Care Teams.  These Care Teams include your primary Cardiologist (physician) and Advanced Practice Providers (APPs -  Physician Assistants and Nurse Practitioners) who all work together to provide you with the care you need, when you need it.  . You will need a follow up appointment as needed  . Providers on your designated Care Team:   . Nicolasa Ducking, NP . Eula Listen, PA-C . Marisue Ivan, PA-C  Any Other Special Instructions Will Be Listed Below (If Applicable).  For educational health videos Log in to : [email or web address] Or : [email or web address], password : [email or web address]

## 2018-10-04 ENCOUNTER — Encounter: Payer: Self-pay | Admitting: Internal Medicine

## 2018-10-17 ENCOUNTER — Ambulatory Visit: Payer: Self-pay | Admitting: *Deleted

## 2018-10-17 NOTE — Telephone Encounter (Signed)
Pt called for possible medication side effect from telmisartan and few seconds of skipped beats that occurred early this am. Side effect: Pt stated that 1 week ago she began having bilateral flank pain (right>left) and mid back pain. Pt stated that she stopped her telmisartan and the bilateral flank pain resolved within 4 days. Pt stated that she is continuing to have mid back discomfort but it is not as bad as it was when it forst started. Pt is unable to describe her mid back discomfort. Pt stated that it does not stop her form performing activities. Pt stated that she took a dose of amlodipine just in case her BP began to rise. Pt has been monitoring her BP and it has been running, 127-140/ 80. Pt wanted to discuss theses side effects. Skipped beats: at 0100 this am, pt stated that she had a few seconds where her heart would skip a beat. Pt stated that her heart skipped every third beat. HR during call 80 bpm. Pt denies chest pain, SOB, swelling to lower extremities, dizziness. Pt stated that it woke her from her sleep.  Care advice given and pt verbalized understanding. Call transferred to office to schedule appointment. Reason for Disposition . Problems with anxiety or stress . Caller has NON-URGENT medication question about med that PCP prescribed and triager unable to answer question  Answer Assessment - Initial Assessment Questions 1. SYMPTOMS: "Do you have any symptoms?"     1 week ago began to have bilateral flank pain (right > left), then later had mid back pain.  2. SEVERITY: If symptoms are present, ask "Are they mild, moderate or severe?"     Flank pain has resolved - mid back mild-stretching helped  Answer Assessment - Initial Assessment Questions 1. DESCRIPTION: "Please describe your heart rate or heart beat that you are having" (e.g., fast/slow, regular/irregular, skipped or extra beats, "palpitations")     Skipped beats that woke pt up- 2. ONSET: "When did it start?" (Minutes,  hours or days)      0100 this am  148/82 3. DURATION: "How long does it last" (e.g., seconds, minutes, hours)    Few seconds 4. PATTERN "Does it come and go, or has it been constant since it started?"  "Does it get worse with exertion?"   "Are you feeling it now?"     Only happened once 5. TAP: "Using your hand, can you tap out what you are feeling on a chair or table in front of you, so that I can hear?" (Note: not all patients can do this)      Beat beat beat skip beat beat beat skip 6. HEART RATE: "Can you tell me your heart rate?" "How many beats in 15 seconds?"  (Note: not all patients can do this)       80 in 1 minute 7. RECURRENT SYMPTOM: "Have you ever had this before?" If so, ask: "When was the last time?" and "What happened that time?"      no 8. CAUSE: "What do you think is causing the palpitations?"     Stress due to self quarantine 9. CARDIAC HISTORY: "Do you have any history of heart disease?" (e.g., heart attack, angina, bypass surgery, angioplasty, arrhythmia)      no 10. OTHER SYMPTOMS: "Do you have any other symptoms?" (e.g., dizziness, chest pain, sweating, difficulty breathing)       no 11. PREGNANCY: "Is there any chance you are pregnant?" "When was your last menstrual period?"  n/a  Protocols used: HEART RATE AND HEARTBEAT QUESTIONS-A-AH, MEDICATION QUESTION CALL-A-AH

## 2018-10-18 ENCOUNTER — Other Ambulatory Visit: Payer: Self-pay

## 2018-10-18 ENCOUNTER — Ambulatory Visit (INDEPENDENT_AMBULATORY_CARE_PROVIDER_SITE_OTHER): Payer: Medicare HMO | Admitting: Internal Medicine

## 2018-10-18 ENCOUNTER — Encounter: Payer: Self-pay | Admitting: Internal Medicine

## 2018-10-18 DIAGNOSIS — E785 Hyperlipidemia, unspecified: Secondary | ICD-10-CM | POA: Diagnosis not present

## 2018-10-18 DIAGNOSIS — I10 Essential (primary) hypertension: Secondary | ICD-10-CM | POA: Diagnosis not present

## 2018-10-18 DIAGNOSIS — N23 Unspecified renal colic: Secondary | ICD-10-CM | POA: Diagnosis not present

## 2018-10-18 DIAGNOSIS — D751 Secondary polycythemia: Secondary | ICD-10-CM

## 2018-10-18 DIAGNOSIS — R109 Unspecified abdominal pain: Secondary | ICD-10-CM | POA: Diagnosis not present

## 2018-10-18 MED ORDER — AMLODIPINE BESYLATE 2.5 MG PO TABS: 2.5000 mg | ORAL_TABLET | Freq: Every day | ORAL | 3 refills | Status: DC

## 2018-10-18 NOTE — Progress Notes (Addendum)
Virtual Visit via Video Note  I connected with Erica Valencia  on 10/18/18 at  2:10 PM EDT by a video enabled telemedicine application and verified that I am speaking with the correct person using two identifiers.  Location patient: home Location provider:work Persons participating in the virtual visit: patient, provider  I discussed the limitations of evaluation and management by telemedicine and the availability of in person appointments. The patient expressed understanding and agreed to proceed.   HPI: 1. Pt called 10/17/2018 reports micardis 20 mg caused side effects I.e heart skipping a beat, flank pain and mid back and low back pain and kidney/flank pain and nightmares once she stopped the medications her sx's resolved..  Her vitals yesterday when she called in were 127-140/80 HR 80 she resumed norvasc 2.5 mg with toprol 25 xl which we had reduced toprol dose and changed to micardis and stopped norvasc 2.5 mg bid due to bradycardia. She has been on norvasc since 10/16/2018 and BP today 5:30 am was 146/85 HR 61 and then 127/74 HR 75 after exercise with her old medications   2. Chest pain she denies saw cardiology disc CT coronary if pt has any further episodes of CP which she has not    ROS: See pertinent positives and negatives per HPI.  Past Medical History:  Diagnosis Date  . Allergy   . Hyperlipidemia   . Hypertension   . Scarlet fever    as child caused vision issues has to wear glasses now    Past Surgical History:  Procedure Laterality Date  . APPENDECTOMY     age [age]  . CARPAL TUNNEL RELEASE     left 2008  . OTHER SURGICAL HISTORY     left great toe fusion 2012     Family History  Problem Relation Age of Onset  . Other Mother        brain tumor died 65    SOCIAL HX: married    Current Outpatient Medications:  .  metoprolol succinate (TOPROL-XL) 25 MG 24 hr tablet, Take 1 tablet (25 mg total) by mouth daily., Disp: 90 tablet, Rfl: 3 .  Multiple Vitamin  (MULTIVITAMIN) tablet, Take 1 tablet by mouth daily., Disp: , Rfl:  .  amLODipine (NORVASC) 2.5 MG tablet, Take 1 tablet (2.5 mg total) by mouth daily., Disp: 90 tablet, Rfl: 3  EXAM:  VITALS per patient if applicable:  GENERAL: alert, oriented, appears well and in no acute distress  HEENT: atraumatic, conjunttiva clear, no obvious abnormalities on inspection of external nose and ears  NECK: normal movements of the head and neck  LUNGS: on inspection no signs of respiratory distress, breathing rate appears normal, no obvious gross SOB, gasping or wheezing  CV: no obvious cyanosis  MS: moves all visible extremities without noticeable abnormality  PSYCH/NEURO: pleasant and cooperative, no obvious depression or anxiety, speech and thought processing grossly intact  ASSESSMENT AND PLAN:  Discussed the following assessment and plan:  Essential hypertension - Plan: amLODipine (NORVASC) 2.5 MG tablet qd and toprol xl 25 mg qd  -pt will my chart in 2 weeks with BP readings on old regimen could consider bid norvasc 2.5 mg or 5 mg qhs with monitor for bradycardia  -of note pt will get copy echo from CT Echo had 1/60/10 LV systolic function normal 93-23% mild concentric LVH, grade 1 dd, trace tricuspid regurgitation, mild doppler pulm HTN, no pericardial effusion   Flank mid and low back pain ? Medication side effect vs other  -  will check bmet, UA and culture r/o kidney etiology I.e infection/aki with new BP med change  Sx's improved after stopping micardis   HM Flu had 2019  pna vx had 2016likley 23 -called [facility] CT today 10/18/2018 their system only goes back 18 months and pna 23 not in system  prevnar had 05/17/14 brought proof of vx Tdap per pt had 2013  zostervax had 2015has not hadshingrix and declines another shingles vaccine had rash with 1st  Last pap 11/28/15 Dr. Barbera Setters neg pap +atrophy HPV neg Last colonoscopy 2015 nl per pt need to get records per pt they  rec in 10 years no h/o polyps she has had colonoscopy x 2 -request Dr. Fayrene Fearing [facility] today signed to get records -no records received b/c no records  Eye exam recently My eye doctor  DEXAlast had >10 years ago will think about anotherdisc prev  mammo9/3/19 normalordered upcoming  Disc hep B/C/MMRtesting pt declines for now  No skin issues    I discussed the assessment and treatment plan with the patient. The patient was provided an opportunity to ask questions and all were answered. The patient agreed with the plan and demonstrated an understanding of the instructions.   The patient was advised to call back or seek an in-person evaluation if the symptoms worsen or if the condition fails to improve as anticipated.  Time spent 15 minutes  Delorise Jackson, MD

## 2018-10-20 ENCOUNTER — Other Ambulatory Visit (INDEPENDENT_AMBULATORY_CARE_PROVIDER_SITE_OTHER): Payer: Medicare HMO

## 2018-10-20 ENCOUNTER — Other Ambulatory Visit: Payer: Self-pay

## 2018-10-20 ENCOUNTER — Encounter: Payer: Self-pay | Admitting: Internal Medicine

## 2018-10-20 DIAGNOSIS — R109 Unspecified abdominal pain: Secondary | ICD-10-CM

## 2018-10-20 DIAGNOSIS — E785 Hyperlipidemia, unspecified: Secondary | ICD-10-CM

## 2018-10-20 DIAGNOSIS — N23 Unspecified renal colic: Secondary | ICD-10-CM | POA: Diagnosis not present

## 2018-10-20 DIAGNOSIS — D751 Secondary polycythemia: Secondary | ICD-10-CM | POA: Diagnosis not present

## 2018-10-20 LAB — CBC WITH DIFFERENTIAL/PLATELET
Basophils Absolute: 0 10*3/uL (ref 0.0–0.1)
Basophils Relative: 0.5 % (ref 0.0–3.0)
Eosinophils Absolute: 0.1 10*3/uL (ref 0.0–0.7)
Eosinophils Relative: 0.9 % (ref 0.0–5.0)
HCT: 41.5 % (ref 36.0–46.0)
Hemoglobin: 14.6 g/dL (ref 12.0–15.0)
Lymphocytes Relative: 30.8 % (ref 12.0–46.0)
Lymphs Abs: 2 10*3/uL (ref 0.7–4.0)
MCHC: 35.2 g/dL (ref 30.0–36.0)
MCV: 85.6 fl (ref 78.0–100.0)
Monocytes Absolute: 0.5 10*3/uL (ref 0.1–1.0)
Monocytes Relative: 8.6 % (ref 3.0–12.0)
Neutro Abs: 3.8 10*3/uL (ref 1.4–7.7)
Neutrophils Relative %: 59.2 % (ref 43.0–77.0)
Platelets: 239 10*3/uL (ref 150.0–400.0)
RBC: 4.85 Mil/uL (ref 3.87–5.11)
RDW: 12.7 % (ref 11.5–15.5)
WBC: 6.4 10*3/uL (ref 4.0–10.5)

## 2018-10-20 LAB — LIPID PANEL
Cholesterol: 208 mg/dL — ABNORMAL HIGH (ref 0–200)
HDL: 59.2 mg/dL (ref >39.00)
LDL Cholesterol: 129 mg/dL — ABNORMAL HIGH (ref 0–99)
NonHDL: 148.86
Total CHOL/HDL Ratio: 4
Triglycerides: 98 mg/dL (ref 0.0–149.0)
VLDL: 19.6 mg/dL (ref 0.0–40.0)

## 2018-10-20 LAB — BASIC METABOLIC PANEL
BUN: 11 mg/dL (ref 6–23)
CO2: 28 mEq/L (ref 19–32)
Calcium: 9.4 mg/dL (ref 8.4–10.5)
Chloride: 103 mEq/L (ref 96–112)
Creatinine, Ser: 0.68 mg/dL (ref 0.40–1.20)
GFR: 85.66 mL/min (ref >60.00)
Glucose, Bld: 75 mg/dL (ref 70–99)
Potassium: 3.8 mEq/L (ref 3.5–5.1)
Sodium: 140 mEq/L (ref 135–145)

## 2018-10-21 LAB — URINE CULTURE
MICRO NUMBER:: 537606
Result:: NO GROWTH
SPECIMEN QUALITY:: ADEQUATE

## 2018-10-21 LAB — URINALYSIS, ROUTINE W REFLEX MICROSCOPIC
Bilirubin Urine: NEGATIVE
Glucose, UA: NEGATIVE
Hgb urine dipstick: NEGATIVE
Leukocytes,Ua: NEGATIVE
Nitrite: NEGATIVE
Protein, ur: NEGATIVE
Specific Gravity, Urine: 1.007 (ref 1.001–1.03)
pH: 5.5 (ref 5.0–8.0)

## 2018-10-24 ENCOUNTER — Other Ambulatory Visit: Payer: Self-pay | Admitting: Internal Medicine

## 2018-10-24 DIAGNOSIS — G4730 Sleep apnea, unspecified: Secondary | ICD-10-CM

## 2018-10-24 DIAGNOSIS — R002 Palpitations: Secondary | ICD-10-CM

## 2018-10-26 ENCOUNTER — Telehealth: Payer: Self-pay | Admitting: Internal Medicine

## 2018-10-26 NOTE — Telephone Encounter (Signed)
Called patient for COVID-19 pre-screening for in office visit.  Have you recently traveled any where out of the local area in the last 2 weeks? NO Have you been in close contact with a person diagnosed with COVID-19 within the last 2 weeks? NO Do you currently have any of the following symptoms? If so, when did they start? Cough     Diarrhea   Joint Pain Fever      Muscle Pain   Red eyes Shortness of breath   Abdominal pain  Vomiting Loss of smell    Rash    Sore Throat Headache    Weakness   Bruising or bleeding   Okay to proceed with visit 10/27/2018

## 2018-10-27 ENCOUNTER — Ambulatory Visit: Payer: Medicare HMO | Admitting: Internal Medicine

## 2018-10-27 ENCOUNTER — Other Ambulatory Visit: Payer: Self-pay

## 2018-10-27 ENCOUNTER — Encounter: Payer: Self-pay | Admitting: Internal Medicine

## 2018-10-27 VITALS — BP 136/78 | HR 68 | Temp 98.6°F | Ht 59.0 in | Wt 138.0 lb

## 2018-10-27 DIAGNOSIS — G4719 Other hypersomnia: Secondary | ICD-10-CM

## 2018-10-27 NOTE — Addendum Note (Signed)
Addended by: Maryanna Shape A on: 10/27/2018 03:38 PM   Modules accepted: Orders

## 2018-10-27 NOTE — Patient Instructions (Signed)

## 2018-10-27 NOTE — Progress Notes (Signed)
[facility]      Assessment and Plan:  Excessive daytime sleepiness. -Symptoms and signs of obstructive sleep apnea. - We will send for sleep study.  Essential hypertension. - Sleep apnea can contribute to above condition, therefore treatment of sleep apnea is an important part of management.   Date: 10/27/2018  MRN# [id number] Phillips OdorJeanette Valencia December 05, 1950   Phillips OdorJeanette Shartzer is a 68 y.o. old female seen in consultation for chief complaint of:    Chief Complaint  Patient presents with  . sleep consult    per Dr. Shirlee LatchMcLean- no prior sleep study. c/o loud snoring and stops breathing during the night.     HPI:   Phillips OdorJeanette Valencia is a 68 y.o. female who presents with complaints of loud snoring, daytime sleepiness.  She usually goes to bed at 11 PM, wakes at 5:30 AM.  Epworth is 1. She snores loudly at night, she was sleeping when she felt that her heart fluttered, her husband says that she stops breathing in her sleep and snores loudly.   She denies sleep paralysis, sleep walking, cataplexy, denies jaw pain or TMJ. No dentures. 2 children with OSA.      PMHX:   Past Medical History:  Diagnosis Date  . Allergy   . Hyperlipidemia   . Hypertension   . Scarlet fever    as child caused vision issues has to wear glasses now   Surgical Hx:  Past Surgical History:  Procedure Laterality Date  . APPENDECTOMY     age [age]  . CARPAL TUNNEL RELEASE     left 2008  . OTHER SURGICAL HISTORY     left great toe fusion 2012    Family Hx:  Family History  Problem Relation Age of Onset  . Other Mother        brain tumor died 7153   Social Hx:   Social History   Tobacco Use  . Smoking status: Never Smoker  . Smokeless tobacco: Never Used  Substance Use Topics  . Alcohol use: No    Frequency: Never  . Drug use: No   Medication:    Current Outpatient Medications:  .  amLODipine (NORVASC) 2.5 MG tablet, Take 1 tablet (2.5 mg total) by mouth daily., Disp:  90 tablet, Rfl: 3 .  metoprolol succinate (TOPROL-XL) 25 MG 24 hr tablet, Take 1 tablet (25 mg total) by mouth daily., Disp: 90 tablet, Rfl: 3 .  Multiple Vitamin (MULTIVITAMIN) tablet, Take 1 tablet by mouth daily., Disp: , Rfl:    Allergies:  Sulfa antibiotics, Telmisartan, and Penicillins  Review of Systems: Gen:  Denies  fever, sweats, chills HEENT: Denies blurred vision, double vision. bleeds, sore throat Cvc:  No dizziness, chest pain. Resp:   Denies cough or sputum production, shortness of breath Gi: Denies swallowing difficulty, stomach pain. Gu:  Denies bladder incontinence, burning urine Ext:   No Joint pain, stiffness. Skin: No skin rash,  hives  Endoc:  No polyuria, polydipsia. Psych: No depression, insomnia. Other:  All other systems were reviewed with the patient and were negative other that what is mentioned in the HPI.   Physical Examination:   VS: BP 136/78 (BP Location: Left Arm)   Pulse 68   Temp 98.6 F (37 C) (Oral)   Ht 4\' 11"  (1.499 m)   Wt 138 lb (62.6 kg)   SpO2 96%   BMI 27.87 kg/m   General Appearance: No distress  Neuro:without focal findings,  speech normal,  HEENT: PERRLA,  EOM intact.  Mallampati 3. Pulmonary: normal breath sounds, No wheezing.  CardiovascularNormal S1,S2.  No m/r/g.   Abdomen: Benign, Soft, non-tender. Renal:  No costovertebral tenderness  GU:  No performed at this time. Endoc: No evident thyromegaly, no signs of acromegaly. Skin:   warm, no rashes, no ecchymosis  Extremities: normal, no cyanosis, clubbing.  Other findings:    LABORATORY PANEL:   CBC No results for input(s): WBC, HGB, HCT, PLT in the last 168 hours. ------------------------------------------------------------------------------------------------------------------  Chemistries  No results for input(s): NA, K, CL, CO2, GLUCOSE, BUN, CREATININE, CALCIUM, MG, AST, ALT, ALKPHOS, BILITOT in the last 168 hours.  Invalid input(s): GFRCGP  ------------------------------------------------------------------------------------------------------------------  Cardiac Enzymes No results for input(s): TROPONINI in the last 168 hours. ------------------------------------------------------------  RADIOLOGY:  No results found.     Thank  you for the consultation and for allowing [facility] to assist in the care of your patient. Our recommendations are noted above.  Please contact us if we can be of further service.   Marda Stalker, M.D., F.C.C.P.  Board Certified in Internal Medicine, Pulmonary Medicine, [place], and Sleep Medicine.  [facility] Office Number: [phone]   10/27/2018

## 2018-11-11 ENCOUNTER — Ambulatory Visit: Payer: Medicare HMO

## 2018-11-11 ENCOUNTER — Other Ambulatory Visit: Payer: Self-pay

## 2018-11-11 DIAGNOSIS — G4733 Obstructive sleep apnea (adult) (pediatric): Secondary | ICD-10-CM | POA: Diagnosis not present

## 2018-11-11 DIAGNOSIS — G4719 Other hypersomnia: Secondary | ICD-10-CM

## 2018-11-16 ENCOUNTER — Telehealth: Payer: Self-pay | Admitting: Internal Medicine

## 2018-11-16 DIAGNOSIS — G4733 Obstructive sleep apnea (adult) (pediatric): Secondary | ICD-10-CM

## 2018-11-16 NOTE — Telephone Encounter (Signed)
HST performed on 11/13/2018 confirmed mild OSA.  Recommend auto cpap 5-20cm h2O Pt is aware of results and voiced her understanding.  Pt is questioning if could use an oral appliance. Pt feels that she will not do well with cpap, due to restless sleep.  DR please advise. Thanks

## 2018-11-17 NOTE — Telephone Encounter (Signed)
Pt is calling back [phone]

## 2018-11-17 NOTE — Telephone Encounter (Signed)
I called patient back, she stated that her son has a CPAP and he told her it really isn't that bad. She would like to proceed with CPAP. Nothing further at this time.

## 2018-12-01 DIAGNOSIS — G4733 Obstructive sleep apnea (adult) (pediatric): Secondary | ICD-10-CM | POA: Diagnosis not present

## 2018-12-05 ENCOUNTER — Other Ambulatory Visit: Payer: Medicare HMO

## 2018-12-05 DIAGNOSIS — G4733 Obstructive sleep apnea (adult) (pediatric): Secondary | ICD-10-CM | POA: Diagnosis not present

## 2018-12-09 ENCOUNTER — Other Ambulatory Visit: Payer: Self-pay

## 2018-12-09 ENCOUNTER — Ambulatory Visit (INDEPENDENT_AMBULATORY_CARE_PROVIDER_SITE_OTHER): Payer: Medicare HMO | Admitting: Internal Medicine

## 2018-12-09 DIAGNOSIS — I10 Essential (primary) hypertension: Secondary | ICD-10-CM | POA: Diagnosis not present

## 2018-12-09 DIAGNOSIS — G4733 Obstructive sleep apnea (adult) (pediatric): Secondary | ICD-10-CM | POA: Diagnosis not present

## 2018-12-09 DIAGNOSIS — E2839 Other primary ovarian failure: Secondary | ICD-10-CM | POA: Diagnosis not present

## 2018-12-09 DIAGNOSIS — Z1329 Encounter for screening for other suspected endocrine disorder: Secondary | ICD-10-CM

## 2018-12-09 DIAGNOSIS — E785 Hyperlipidemia, unspecified: Secondary | ICD-10-CM

## 2018-12-09 NOTE — Patient Instructions (Signed)
Cholesterol Content in Foods Cholesterol is a waxy, fat-like substance that helps to carry fat in the blood. The body needs cholesterol in small amounts, but too much cholesterol can cause damage to the arteries and heart. Most people should eat less than 200 milligrams (mg) of cholesterol a day. Foods with cholesterol  Cholesterol is found in animal-based foods, such as meat, seafood, and dairy. Generally, low-fat dairy and lean meats have less cholesterol than full-fat dairy and fatty meats. The milligrams of cholesterol per serving (mg per serving) of common cholesterol-containing foods are listed below. Meat and other proteins  Egg - one large whole egg has 186 mg.  Veal shank - 4 oz has 141 mg.  Lean ground turkey (93% lean) - 4 oz has 118 mg.  Fat-trimmed [place] loin - 4 oz has 106 mg.  Lean ground beef (90% lean) - 4 oz has 100 mg.  Lobster - 3.5 oz has 90 mg.  Pork loin chops - 4 oz has 86 mg.  Canned salmon - 3.5 oz has 83 mg.  Fat-trimmed beef top loin - 4 oz has 78 mg.  Frankfurter - 1 frank (3.5 oz) has 77 mg.  Crab - 3.5 oz has 71 mg.  Roasted chicken without skin, white meat - 4 oz has 66 mg.  Light bologna - 2 oz has 45 mg.  Deli-cut turkey - 2 oz has 31 mg.  Canned tuna - 3.5 oz has 31 mg.  Bacon - 1 oz has 29 mg.  Oysters and mussels (raw) - 3.5 oz has 25 mg.  Mackerel - 1 oz has 22 mg.  Trout - 1 oz has 20 mg.  Pork sausage - 1 link (1 oz) has 17 mg.  Salmon - 1 oz has 16 mg.  Tilapia - 1 oz has 14 mg. Dairy  Soft-serve ice cream -  cup (4 oz) has 103 mg.  Whole-milk yogurt - 1 cup (8 oz) has 29 mg.  Cheddar cheese - 1 oz has 28 mg.  American cheese - 1 oz has 28 mg.  Whole milk - 1 cup (8 oz) has 23 mg.  2% milk - 1 cup (8 oz) has 18 mg.  Cream cheese - 1 tablespoon (Tbsp) has 15 mg.  Cottage cheese -  cup (4 oz) has 14 mg.  Low-fat (1%) milk - 1 cup (8 oz) has 10 mg.  Sour cream - 1 Tbsp has 8.5 mg.  Low-fat yogurt - 1 cup  (8 oz) has 8 mg.  Nonfat Greek yogurt - 1 cup (8 oz) has 7 mg.  Half-and-half cream - 1 Tbsp has 5 mg. Fats and oils  Cod liver oil - 1 tablespoon (Tbsp) has 82 mg.  Butter - 1 Tbsp has 15 mg.  Lard - 1 Tbsp has 14 mg.  Bacon grease - 1 Tbsp has 14 mg.  Mayonnaise - 1 Tbsp has 5-10 mg.  Margarine - 1 Tbsp has 3-10 mg. Exact amounts of cholesterol in these foods may vary depending on specific ingredients and brands. Foods without cholesterol Most plant-based foods do not have cholesterol unless you combine them with a food that has cholesterol. Foods without cholesterol include:  Grains and cereals.  Vegetables.  Fruits.  Vegetable oils, such as olive, canola, and sunflower oil.  Legumes, such as peas, beans, and lentils.  Nuts and seeds.  Egg whites. Summary  The body needs cholesterol in small amounts, but too much cholesterol can cause damage to the arteries and heart.    Most people should eat less than 200 milligrams (mg) of cholesterol a day. This information is not intended to replace advice given to you by your health care provider. Make sure you discuss any questions you have with your health care provider. Document Released: 12/29/2016 Document Revised: 04/16/2017 Document Reviewed: 12/29/2016 Elsevier Patient Education  2020 Elsevier Inc.  

## 2018-12-09 NOTE — Progress Notes (Signed)
Telephone Note  I connected with Erica Valencia   on 12/09/18 at  8:00 AM EDT by a telephone and verified that I am speaking with the correct person using two identifiers.  Location patient: home Location provider:work  Persons participating in the virtual visit: patient, provider  I discussed the limitations of evaluation and management by telemedicine and the availability of in person appointments. The patient expressed understanding and agreed to proceed.   HPI: 1. HTN on norvasc 2.5 and toprol 25 xl qd BP randing 128-139/70s-81 BP before exerciese 138/80 and after 128/79  2. OSA on cpap mild PSG 11/13/18 not tolerating cpap machine full face mask but trying to wear 5 hrs at least but walking up after 5 hrs 3. Chest pain none since last visit nor skipping beat or racing heart  4. HLD taking nutrim for cholesterol and trying to monitor diet    ROS: See pertinent positives and negatives per HPI.  Past Medical History:  Diagnosis Date  . Allergy   . Hyperlipidemia   . Hypertension   . Scarlet fever    as child caused vision issues has to wear glasses now    Past Surgical History:  Procedure Laterality Date  . APPENDECTOMY     age [age]  . CARPAL TUNNEL RELEASE     left 2008  . OTHER SURGICAL HISTORY     left great toe fusion 2012     Family History  Problem Relation Age of Onset  . Other Mother        brain tumor died 30    SOCIAL HX: married   Current Outpatient Medications:  .  amLODipine (NORVASC) 2.5 MG tablet, Take 1 tablet (2.5 mg total) by mouth daily., Disp: 90 tablet, Rfl: 3 .  metoprolol succinate (TOPROL-XL) 25 MG 24 hr tablet, Take 1 tablet (25 mg total) by mouth daily., Disp: 90 tablet, Rfl: 3 .  Multiple Vitamin (MULTIVITAMIN) tablet, Take 1 tablet by mouth daily., Disp: , Rfl:   EXAM:  VITALS per patient if applicable:  GENERAL: alert, oriented, appears well and in no acute distress  PSYCH/NEURO: pleasant and cooperative, no obvious depression or  anxiety, speech and thought processing grossly intact  ASSESSMENT AND PLAN:  Discussed the following assessment and plan:  Essential hypertension - Plan: Lipid panel, Comprehensive metabolic panel, cont meds taken norvasc 2.5 mg qd to bid if BP >130/>80  Cont toprol xl 25 mg qd   OSA (obstructive sleep apnea) - Plan: cont cpap and f/u pulm   Hyperlipidemia, unspecified hyperlipidemia type - Plan: Lipid panel, Comprehensive metabolic panel, declines statin consider zetia in future if lipid panel not improving on nutrim  Estrogen deficiency - Plan: DG Bone Density   HM Flu had 2019  pna vx had 2016likley 23 -called [facility] [place] today 10/18/2018 their system only goes back 18 months and pna 23 not in system  prevnar had 05/17/14 brought proof of vx Tdap per pt had 2013  zostervax had 2015has not hadshingrix and declines another shingles vaccine had rash with 1st  Last pap 11/28/15 Dr. Barbera Setters neg pap +atrophy HPV neg Last colonoscopy 2015 nl per pt need to get records per pt they rec in 10 years no h/o polyps she has had colonoscopy x 2 -request Dr. Fayrene Fearing [facility] today signed to get records -no records received b/c no records  Eye exam recently My eye doctor  DEXAlast had >10 years ago ordered another  mammo9/3/19 normalordered upcoming  Disc hep B/C/MMRtesting  pt declines for now  No skin issues     I discussed the assessment and treatment plan with the patient. The patient was provided an opportunity to ask questions and all were answered. The patient agreed with the plan and demonstrated an understanding of the instructions.   The patient was advised to call back or seek an in-person evaluation if the symptoms worsen or if the condition fails to improve as anticipated.  Time spent 15 minutes  Delorise Jackson, MD

## 2018-12-14 ENCOUNTER — Encounter: Payer: Self-pay | Admitting: Internal Medicine

## 2018-12-19 ENCOUNTER — Ambulatory Visit: Payer: Medicare HMO

## 2018-12-19 ENCOUNTER — Ambulatory Visit: Payer: Medicare HMO | Admitting: Internal Medicine

## 2019-01-01 DIAGNOSIS — G4733 Obstructive sleep apnea (adult) (pediatric): Secondary | ICD-10-CM | POA: Diagnosis not present

## 2019-01-10 DIAGNOSIS — G4733 Obstructive sleep apnea (adult) (pediatric): Secondary | ICD-10-CM | POA: Diagnosis not present

## 2019-01-24 ENCOUNTER — Ambulatory Visit
Admission: RE | Admit: 2019-01-24 | Discharge: 2019-01-24 | Disposition: A | Discharge status: Home / Self Care | Payer: Medicare HMO | Source: Ambulatory Visit | Attending: Internal Medicine | Admitting: Internal Medicine

## 2019-01-24 DIAGNOSIS — M8589 Other specified disorders of bone density and structure, multiple sites: Secondary | ICD-10-CM | POA: Diagnosis not present

## 2019-01-24 DIAGNOSIS — Z1231 Encounter for screening mammogram for malignant neoplasm of breast: Secondary | ICD-10-CM | POA: Diagnosis not present

## 2019-01-24 DIAGNOSIS — E2839 Other primary ovarian failure: Secondary | ICD-10-CM | POA: Diagnosis not present

## 2019-01-24 DIAGNOSIS — Z780 Asymptomatic menopausal state: Secondary | ICD-10-CM | POA: Diagnosis not present

## 2019-01-24 DIAGNOSIS — M810 Age-related osteoporosis without current pathological fracture: Secondary | ICD-10-CM | POA: Diagnosis not present

## 2019-01-25 ENCOUNTER — Encounter: Payer: Self-pay | Admitting: Internal Medicine

## 2019-01-25 DIAGNOSIS — M810 Age-related osteoporosis without current pathological fracture: Secondary | ICD-10-CM | POA: Insufficient documentation

## 2019-01-25 DIAGNOSIS — R69 Illness, unspecified: Secondary | ICD-10-CM | POA: Diagnosis not present

## 2019-02-01 DIAGNOSIS — G4733 Obstructive sleep apnea (adult) (pediatric): Secondary | ICD-10-CM | POA: Diagnosis not present

## 2019-02-10 ENCOUNTER — Other Ambulatory Visit: Payer: Self-pay | Admitting: Internal Medicine

## 2019-02-10 ENCOUNTER — Encounter: Payer: Self-pay | Admitting: Internal Medicine

## 2019-02-10 DIAGNOSIS — G4733 Obstructive sleep apnea (adult) (pediatric): Secondary | ICD-10-CM | POA: Diagnosis not present

## 2019-02-10 DIAGNOSIS — I10 Essential (primary) hypertension: Secondary | ICD-10-CM

## 2019-02-10 MED ORDER — AMLODIPINE BESYLATE 2.5 MG PO TABS: 2.5000 mg | ORAL_TABLET | Freq: Two times a day (BID) | ORAL | 3 refills | Status: DC

## 2019-03-01 ENCOUNTER — Ambulatory Visit: Payer: Medicare HMO | Admitting: Internal Medicine

## 2019-03-01 ENCOUNTER — Other Ambulatory Visit: Payer: Self-pay

## 2019-03-01 ENCOUNTER — Encounter: Payer: Self-pay | Admitting: Internal Medicine

## 2019-03-01 VITALS — BP 118/82 | HR 74 | Temp 97.9°F | Ht 59.0 in | Wt 136.0 lb

## 2019-03-01 DIAGNOSIS — G4733 Obstructive sleep apnea (adult) (pediatric): Secondary | ICD-10-CM | POA: Diagnosis not present

## 2019-03-01 NOTE — Patient Instructions (Signed)
Continue CPAP as prescribed. 

## 2019-03-01 NOTE — Progress Notes (Signed)
[facility] Consultation     SYNOPSIS  Erica Valencia is a 68 y.o. female who presents with complaints of loud snoring, daytime sleepiness.  She usually goes to bed at 11 PM, wakes at 5:30 AM.  Epworth is 1. She snores loudly at night, she was sleeping when she felt that her heart fluttered, her husband says that she stops breathing in her sleep and snores loudly.   She denies sleep paralysis, sleep walking, cataplexy, denies jaw pain or TMJ. No dentures. 2 children with OSA.   **11/13/2018 home sleep study-29 desaturations AHI of 6 Diagnosis mild obstructive sleep apnea  Date: 03/01/2019  MRN# [id number] Erica Valencia 05/31/1950   CC Follow-up excessive daytime sleepiness  HPI:   Sleep studies results reviewed with patient AHI of 6 Mild obstructive sleep apnea, patient was recommended to start on auto CPAP 5 to 20 cm of pressure He has excellent compliance report AHI is reduced to 0.2  She has more energy Less fatigue More active   No evidence of heart failure at this time No evidence or signs of infection at this time No respiratory distress No fevers, chills, nausea, vomiting, diarrhea No evidence of lower extremity edema No evidence hemoptysis         PMHX:   Past Medical History:  Diagnosis Date  . Allergy   . Hyperlipidemia   . Hypertension   . Scarlet fever    as child caused vision issues has to wear glasses now   Surgical Hx:  Past Surgical History:  Procedure Laterality Date  . APPENDECTOMY     age [age]  . CARPAL TUNNEL RELEASE     left 2008  . OTHER SURGICAL HISTORY     left great toe fusion 2012    Family Hx:  Family History  Problem Relation Age of Onset  . Other Mother        brain tumor died 53  . Breast cancer Neg Hx    Social Hx:   Social History   Tobacco Use  . Smoking status: Never Smoker  . Smokeless tobacco: Never Used  Substance Use Topics  . Alcohol use: No    Frequency: Never  . Drug use: No    Medication:    Current Outpatient Medications:  .  amLODipine (NORVASC) 2.5 MG tablet, Take 1 tablet (2.5 mg total) by mouth 2 (two) times daily., Disp: 180 tablet, Rfl: 3 .  metoprolol succinate (TOPROL-XL) 25 MG 24 hr tablet, Take 1 tablet (25 mg total) by mouth daily., Disp: 90 tablet, Rfl: 3 .  Multiple Vitamin (MULTIVITAMIN) tablet, Take 1 tablet by mouth daily., Disp: , Rfl:    Allergies:  Sulfa antibiotics, Telmisartan, and Penicillins   Review of Systems:  Gen:  Denies  fever, sweats, chills weight loss  HEENT: Denies blurred vision, double vision, ear pain, eye pain, hearing loss, nose bleeds, sore throat Cardiac:  No dizziness, chest pain or heaviness, chest tightness,edema, No JVD Resp:   No cough, -sputum production, -shortness of breath,-wheezing, -hemoptysis,  Gi: Denies swallowing difficulty, stomach pain, nausea or vomiting, diarrhea, constipation, bowel incontinence Gu:  Denies bladder incontinence, burning urine Ext:   Denies Joint pain, stiffness or swelling Skin: Denies  skin rash, easy bruising or bleeding or hives Endoc:  Denies polyuria, polydipsia , polyphagia or weight change Psych:   Denies depression, insomnia or hallucinations  Other:  All other systems negative    BP 118/82 (BP Location: Left Arm, Cuff Size: Normal)  Pulse 74   Temp 97.9 F (36.6 C) (Temporal)   Ht 4\' 11"  (1.499 m)   Wt 136 lb (61.7 kg)   SpO2 95%   BMI 27.47 kg/m      Physical Examination:   GENERAL:NAD, no fevers, chills, no weakness no fatigue HEAD: Normocephalic, atraumatic.  EYES: PERLA, EOMI No scleral icterus.  NECK: Supple. No thyromegaly.  No JVD.  PULMONARY: CTA B/L no wheezing, rhonchi, crackles CARDIOVASCULAR: S1 and S2. Regular rate and rhythm. No murmurs GASTROINTESTINAL: Soft, nontender, nondistended. Positive bowel sounds.  MUSCULOSKELETAL: No swelling, clubbing, or edema.  NEUROLOGIC: No gross focal neurological deficits. 5/5 strength all  extremities SKIN: No ulceration, lesions, rashes, or cyanosis.  PSYCHIATRIC: Insight, judgment intact. -depression -anxiety ALL OTHER ROS ARE NEGATIVE     Assessment and Plan:   Excessive daytime sleepiness with underlying diagnosis of obstructive sleep apnea  Continue auto CPAP as prescribed 5 to 20 cm of water pressure AHI reduced to 0.2 Patient uses and benefits from CPAP therapy  Essential hypertension Sleep apnea can contribute to above condition therefore treatment of sleep apnea is an important part of management  COVID-19 EDUCATION: The signs and symptoms of COVID-19 were discussed with the patient and how to seek care for testing.  The importance of social distancing was discussed today. Hand Washing Techniques and avoid touching face was advised.     MEDICATION ADJUSTMENTS/LABS AND TESTS ORDERED: Continue CPAP as prescribed   CURRENT MEDICATIONS REVIEWED AT LENGTH WITH PATIENT TODAY   Patient satisfied with Plan of action and management. All questions answered  Follow up in 1 year   Clifford Coudriet 02-06-1995, M.D.  [facility]  Medical Director [facility] Medical Director [facility] Cardio-Pulmonary Department

## 2019-03-03 DIAGNOSIS — G4733 Obstructive sleep apnea (adult) (pediatric): Secondary | ICD-10-CM | POA: Diagnosis not present

## 2019-03-15 DIAGNOSIS — R69 Illness, unspecified: Secondary | ICD-10-CM | POA: Diagnosis not present

## 2019-04-03 DIAGNOSIS — G4733 Obstructive sleep apnea (adult) (pediatric): Secondary | ICD-10-CM | POA: Diagnosis not present

## 2019-04-21 ENCOUNTER — Other Ambulatory Visit: Payer: Self-pay

## 2019-04-21 ENCOUNTER — Other Ambulatory Visit: Payer: Medicare HMO

## 2019-04-25 ENCOUNTER — Other Ambulatory Visit (INDEPENDENT_AMBULATORY_CARE_PROVIDER_SITE_OTHER): Payer: Medicare HMO

## 2019-04-25 ENCOUNTER — Other Ambulatory Visit: Payer: Self-pay

## 2019-04-25 DIAGNOSIS — E785 Hyperlipidemia, unspecified: Secondary | ICD-10-CM | POA: Diagnosis not present

## 2019-04-25 DIAGNOSIS — I10 Essential (primary) hypertension: Secondary | ICD-10-CM

## 2019-04-25 DIAGNOSIS — Z1329 Encounter for screening for other suspected endocrine disorder: Secondary | ICD-10-CM

## 2019-04-25 LAB — COMPREHENSIVE METABOLIC PANEL
ALT: 16 U/L (ref 0–35)
AST: 21 U/L (ref 0–37)
Albumin: 4.5 g/dL (ref 3.5–5.2)
Alkaline Phosphatase: 73 U/L (ref 39–117)
BUN: 13 mg/dL (ref 6–23)
CO2: 22 mEq/L (ref 19–32)
Calcium: 9.6 mg/dL (ref 8.4–10.5)
Chloride: 101 mEq/L (ref 96–112)
Creatinine, Ser: 0.67 mg/dL (ref 0.40–1.20)
GFR: 87.01 mL/min (ref >60.00)
Glucose, Bld: 83 mg/dL (ref 70–99)
Potassium: 4.3 mEq/L (ref 3.5–5.1)
Sodium: 136 mEq/L (ref 135–145)
Total Bilirubin: 0.8 mg/dL (ref 0.2–1.2)
Total Protein: 6.8 g/dL (ref 6.0–8.3)

## 2019-04-25 LAB — LIPID PANEL
Cholesterol: 236 mg/dL — ABNORMAL HIGH (ref 0–200)
HDL: 65.2 mg/dL (ref >39.00)
LDL Cholesterol: 153 mg/dL — ABNORMAL HIGH (ref 0–99)
NonHDL: 171.09
Total CHOL/HDL Ratio: 4
Triglycerides: 90 mg/dL (ref 0.0–149.0)
VLDL: 18 mg/dL (ref 0.0–40.0)

## 2019-04-25 LAB — TSH: TSH: 1.53 u[IU]/mL (ref 0.35–4.50)

## 2019-04-27 ENCOUNTER — Encounter: Payer: Self-pay | Admitting: Internal Medicine

## 2019-04-27 ENCOUNTER — Ambulatory Visit (INDEPENDENT_AMBULATORY_CARE_PROVIDER_SITE_OTHER): Payer: Medicare HMO | Admitting: Internal Medicine

## 2019-04-27 VITALS — BP 130/79 | HR 72 | Ht 59.0 in | Wt 136.0 lb

## 2019-04-27 DIAGNOSIS — E785 Hyperlipidemia, unspecified: Secondary | ICD-10-CM | POA: Diagnosis not present

## 2019-04-27 DIAGNOSIS — I10 Essential (primary) hypertension: Secondary | ICD-10-CM

## 2019-04-27 MED ORDER — EZETIMIBE 10 MG PO TABS: 10.0000 mg | ORAL_TABLET | Freq: Every day | ORAL | 3 refills | Status: DC

## 2019-04-27 NOTE — Progress Notes (Signed)
Virtual Visit via Video Note  I connected with Erica Valencia  on 04/27/19 at  8:00 AM EST by a video enabled telemedicine application and verified that I am speaking with the correct person using two identifiers.  Location patient: home Location provider:work or home office Persons participating in the virtual visit: patient, provider  I discussed the limitations of evaluation and management by telemedicine and the availability of in person appointments. The patient expressed understanding and agreed to proceed.   HPI: 1. HTN on norvasc 2.5 mg bid toprol 25 mg qd qd BP running 1202-/1302s/70s 2. HLD doesn't want to take statin 10 years ago had side effects she is eating right and exercising  3. Denies back pain    ROS: See pertinent positives and negatives per HPI.  Past Medical History:  Diagnosis Date  . Allergy   . Hyperlipidemia   . Hypertension   . Scarlet fever    as child caused vision issues has to wear glasses now    Past Surgical History:  Procedure Laterality Date  . APPENDECTOMY     age [age]  . CARPAL TUNNEL RELEASE     left 2008  . OTHER SURGICAL HISTORY     left great toe fusion 2012     Family History  Problem Relation Age of Onset  . Other Mother        brain tumor died 53  . Heart attack Father        83s  . Breast cancer Neg Hx     SOCIAL HX:   Moved from [place] 2018 Mother was [place] she was born in [place] father American soldier never knew  Married  Retired from [employer] daily 20 minutes x 3 intervals  Current Outpatient Medications:  .  amLODipine (NORVASC) 2.5 MG tablet, Take 1 tablet (2.5 mg total) by mouth 2 (two) times daily., Disp: 180 tablet, Rfl: 3 .  metoprolol succinate (TOPROL-XL) 25 MG 24 hr tablet, Take 1 tablet (25 mg total) by mouth daily., Disp: 90 tablet, Rfl: 3 .  Multiple Vitamin (MULTIVITAMIN) tablet, Take 1 tablet by mouth daily., Disp: , Rfl:  .  ezetimibe (ZETIA) 10 MG tablet, Take 1  tablet (10 mg total) by mouth daily., Disp: 90 tablet, Rfl: 3  EXAM:  VITALS per patient if applicable:  GENERAL: alert, oriented, appears well and in no acute distress  HEENT: atraumatic, conjunttiva clear, no obvious abnormalities on inspection of external nose and ears  NECK: normal movements of the head and neck  LUNGS: on inspection no signs of respiratory distress, breathing rate appears normal, no obvious gross SOB, gasping or wheezing  CV: no obvious cyanosis  MS: moves all visible extremities without noticeable abnormality  PSYCH/NEURO: pleasant and cooperative, no obvious depression or anxiety, speech and thought processing grossly intact  ASSESSMENT AND PLAN:  Discussed the following assessment and plan:  Essential hypertension cont meds   Hyperlipidemia, unspecified hyperlipidemia type - Plan: ezetimibe (ZETIA) 10 MG tablet Labs in 4 months   HM Flu had 2020 pna vx had 2016likley 23 -called [facility] [place] today 10/18/2018 their system only goes back 18 months and pna 23 not in system  prevnar had 05/17/14 brought proof of vx Tdap per pt had 2013  zostervax had 2015has not hadshingrix and declines another shingles vaccine had rash with 1st  Last pap 11/28/15 Dr. Barbera Setters neg pap +atrophy HPV neg Last colonoscopy 2015 nl per pt need to get records per pt  they rec in 10 years no h/o polyps she has had colonoscopy x 2 -request Dr. Fayrene Fearing [facility] today signed to get records -no records received b/c no records  Eye exam recently My eye doctor  DEXAlast had >10 years ago will think about anotherdiscprev mammo9/3/19 normalordered upcoming  Disc hep B/C/MMRtesting pt declines for now  No skin issues   -we discussed possible serious and likely etiologies, options for evaluation and workup, limitations of telemedicine visit vs in person visit, treatment, treatment risks and precautions. Pt prefers to treat via telemedicine  empirically rather then risking or undertaking an in person visit at this moment. Patient agrees to seek prompt in person care if worsening, new symptoms arise, or if is not improving with treatment.   I discussed the assessment and treatment plan with the patient. The patient was provided an opportunity to ask questions and all were answered. The patient agreed with the plan and demonstrated an understanding of the instructions.   The patient was advised to call back or seek an in-person evaluation if the symptoms worsen or if the condition fails to improve as anticipated.  Time spent 15 minutes  Delorise Jackson, MD

## 2019-05-02 ENCOUNTER — Other Ambulatory Visit: Payer: Medicare HMO

## 2019-05-03 DIAGNOSIS — G4733 Obstructive sleep apnea (adult) (pediatric): Secondary | ICD-10-CM | POA: Diagnosis not present

## 2019-05-23 ENCOUNTER — Encounter: Payer: Self-pay | Admitting: Internal Medicine

## 2019-06-01 DIAGNOSIS — G4733 Obstructive sleep apnea (adult) (pediatric): Secondary | ICD-10-CM | POA: Diagnosis not present

## 2019-06-03 DIAGNOSIS — G4733 Obstructive sleep apnea (adult) (pediatric): Secondary | ICD-10-CM | POA: Diagnosis not present

## 2019-06-23 ENCOUNTER — Other Ambulatory Visit: Payer: Self-pay | Admitting: Internal Medicine

## 2019-06-23 DIAGNOSIS — I10 Essential (primary) hypertension: Secondary | ICD-10-CM

## 2019-06-23 MED ORDER — AMLODIPINE BESYLATE 2.5 MG PO TABS: 2.5000 mg | ORAL_TABLET | Freq: Two times a day (BID) | ORAL | 3 refills | Status: DC

## 2019-07-02 DIAGNOSIS — G4733 Obstructive sleep apnea (adult) (pediatric): Secondary | ICD-10-CM | POA: Diagnosis not present

## 2019-07-04 DIAGNOSIS — G4733 Obstructive sleep apnea (adult) (pediatric): Secondary | ICD-10-CM | POA: Diagnosis not present

## 2019-07-30 DIAGNOSIS — G4733 Obstructive sleep apnea (adult) (pediatric): Secondary | ICD-10-CM | POA: Diagnosis not present

## 2019-08-01 DIAGNOSIS — G4733 Obstructive sleep apnea (adult) (pediatric): Secondary | ICD-10-CM | POA: Diagnosis not present

## 2019-08-08 DIAGNOSIS — H35033 Hypertensive retinopathy, bilateral: Secondary | ICD-10-CM | POA: Diagnosis not present

## 2019-08-08 DIAGNOSIS — H524 Presbyopia: Secondary | ICD-10-CM | POA: Diagnosis not present

## 2019-08-08 DIAGNOSIS — I10 Essential (primary) hypertension: Secondary | ICD-10-CM | POA: Diagnosis not present

## 2019-08-08 DIAGNOSIS — H18413 Arcus senilis, bilateral: Secondary | ICD-10-CM | POA: Diagnosis not present

## 2019-08-08 DIAGNOSIS — Z135 Encounter for screening for eye and ear disorders: Secondary | ICD-10-CM | POA: Diagnosis not present

## 2019-08-08 DIAGNOSIS — H25813 Combined forms of age-related cataract, bilateral: Secondary | ICD-10-CM | POA: Diagnosis not present

## 2019-08-08 DIAGNOSIS — Z0100 Encounter for examination of eyes and vision without abnormal findings: Secondary | ICD-10-CM | POA: Diagnosis not present

## 2019-08-16 ENCOUNTER — Other Ambulatory Visit: Payer: Self-pay | Admitting: Internal Medicine

## 2019-08-16 DIAGNOSIS — I10 Essential (primary) hypertension: Secondary | ICD-10-CM

## 2019-08-16 MED ORDER — METOPROLOL SUCCINATE ER 25 MG PO TB24: 25.0000 mg | ORAL_TABLET | Freq: Every day | ORAL | 3 refills | Status: DC

## 2019-08-28 ENCOUNTER — Other Ambulatory Visit (INDEPENDENT_AMBULATORY_CARE_PROVIDER_SITE_OTHER): Payer: Medicare HMO

## 2019-08-28 ENCOUNTER — Other Ambulatory Visit: Payer: Self-pay

## 2019-08-28 DIAGNOSIS — E785 Hyperlipidemia, unspecified: Secondary | ICD-10-CM

## 2019-08-28 DIAGNOSIS — I10 Essential (primary) hypertension: Secondary | ICD-10-CM | POA: Diagnosis not present

## 2019-08-28 LAB — CBC WITH DIFFERENTIAL/PLATELET
Basophils Absolute: 0 10*3/uL (ref 0.0–0.1)
Basophils Relative: 0.8 % (ref 0.0–3.0)
Eosinophils Absolute: 0.1 10*3/uL (ref 0.0–0.7)
Eosinophils Relative: 1.3 % (ref 0.0–5.0)
HCT: 39.2 % (ref 36.0–46.0)
Hemoglobin: 13.9 g/dL (ref 12.0–15.0)
Lymphocytes Relative: 28.6 % (ref 12.0–46.0)
Lymphs Abs: 1.4 10*3/uL (ref 0.7–4.0)
MCHC: 35.5 g/dL (ref 30.0–36.0)
MCV: 86.7 fl (ref 78.0–100.0)
Monocytes Absolute: 0.5 10*3/uL (ref 0.1–1.0)
Monocytes Relative: 9.7 % (ref 3.0–12.0)
Neutro Abs: 2.9 10*3/uL (ref 1.4–7.7)
Neutrophils Relative %: 59.6 % (ref 43.0–77.0)
Platelets: 227 10*3/uL (ref 150.0–400.0)
RBC: 4.52 Mil/uL (ref 3.87–5.11)
RDW: 12.9 % (ref 11.5–15.5)
WBC: 4.9 10*3/uL (ref 4.0–10.5)

## 2019-08-28 LAB — LIPID PANEL
Cholesterol: 208 mg/dL — ABNORMAL HIGH (ref 0–200)
HDL: 57.2 mg/dL (ref >39.00)
LDL Cholesterol: 135 mg/dL — ABNORMAL HIGH (ref 0–99)
NonHDL: 150.8
Total CHOL/HDL Ratio: 4
Triglycerides: 77 mg/dL (ref 0.0–149.0)
VLDL: 15.4 mg/dL (ref 0.0–40.0)

## 2019-08-28 LAB — COMPREHENSIVE METABOLIC PANEL
ALT: 15 U/L (ref 0–35)
AST: 19 U/L (ref 0–37)
Albumin: 4.4 g/dL (ref 3.5–5.2)
Alkaline Phosphatase: 69 U/L (ref 39–117)
BUN: 11 mg/dL (ref 6–23)
CO2: 27 mEq/L (ref 19–32)
Calcium: 9.2 mg/dL (ref 8.4–10.5)
Chloride: 102 mEq/L (ref 96–112)
Creatinine, Ser: 0.74 mg/dL (ref 0.40–1.20)
GFR: 77.51 mL/min (ref >60.00)
Glucose, Bld: 95 mg/dL (ref 70–99)
Potassium: 3.8 mEq/L (ref 3.5–5.1)
Sodium: 136 mEq/L (ref 135–145)
Total Bilirubin: 0.6 mg/dL (ref 0.2–1.2)
Total Protein: 6.7 g/dL (ref 6.0–8.3)

## 2019-08-31 ENCOUNTER — Other Ambulatory Visit: Payer: Self-pay

## 2019-08-31 ENCOUNTER — Ambulatory Visit (INDEPENDENT_AMBULATORY_CARE_PROVIDER_SITE_OTHER): Payer: Medicare HMO | Admitting: Internal Medicine

## 2019-08-31 ENCOUNTER — Encounter: Payer: Self-pay | Admitting: Internal Medicine

## 2019-08-31 VITALS — BP 130/78 | HR 67 | Temp 97.5°F | Ht 59.0 in | Wt 136.4 lb

## 2019-08-31 DIAGNOSIS — I10 Essential (primary) hypertension: Secondary | ICD-10-CM

## 2019-08-31 DIAGNOSIS — E785 Hyperlipidemia, unspecified: Secondary | ICD-10-CM

## 2019-08-31 DIAGNOSIS — Z1231 Encounter for screening mammogram for malignant neoplasm of breast: Secondary | ICD-10-CM | POA: Diagnosis not present

## 2019-08-31 DIAGNOSIS — Z1389 Encounter for screening for other disorder: Secondary | ICD-10-CM

## 2019-08-31 NOTE — Progress Notes (Addendum)
Chief Complaint  Patient presents with  . Follow-up  . Medication Problem    Zetia ccaused patient to have backand kidney pain.    F/u 1. HLD not able to tolerate zetia caused side effects  2. HTN BP controlled today on norvasc 2.5 mg bid toprol 25 mg qd range at home 117-129/60s-70s  Doing well no side effects   Review of Systems  Constitutional: Negative for weight loss.  HENT: Negative for hearing loss.   Eyes: Negative for blurred vision.  Respiratory: Negative for shortness of breath.   Cardiovascular: Negative for chest pain.  Gastrointestinal: Negative for abdominal pain.  Musculoskeletal: Negative for back pain.  Skin: Negative for rash.  Neurological: Negative for headaches.  Psychiatric/Behavioral: Negative for depression.   Past Medical History:  Diagnosis Date  . Allergy   . Hyperlipidemia   . Hypertension   . Scarlet fever    as child caused vision issues has to wear glasses now   Past Surgical History:  Procedure Laterality Date  . APPENDECTOMY     age [age]  . CARPAL TUNNEL RELEASE     left 2008  . OTHER SURGICAL HISTORY     left great toe fusion 2012    Family History  Problem Relation Age of Onset  . Other Mother        brain tumor died 35  . Heart attack Father        38s  . Breast cancer Neg Hx    Social History   Socioeconomic History  . Marital status: Married    Spouse name: Not on file  . Number of children: Not on file  . Years of education: Not on file  . Highest education level: Not on file  Occupational History  . Not on file  Tobacco Use  . Smoking status: Never Smoker  . Smokeless tobacco: Never Used  Substance and Sexual Activity  . Alcohol use: No  . Drug use: No  . Sexual activity: Never    Partners: Male  Other Topics Concern  . 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 Difficulty of Paying Living Expenses:   Food Insecurity:   . Worried About Charity fundraiser in the Last Year:   . Arboriculturist in the Last Year:   Transportation Needs:   . Film/video editor (Medical):   Marland Kitchen Lack of Transportation (Non-Medical):   Physical Activity:   . Days of Exercise per Week:   . Minutes of Exercise per Session:   Stress:   . Feeling of Stress :   Social Connections:   . Frequency of Communication with Friends and Family:   . Frequency of Social Gatherings with Friends and Family:   . Attends Religious Services:   . Active Member of Clubs or Organizations:   . Attends Archivist Meetings:   Marland Kitchen Marital Status:   Intimate Partner Violence:   . Fear of Current or Ex-Partner:   . Emotionally Abused:   Marland Kitchen Physically Abused:   . Sexually Abused:    Current Meds  Medication Sig  . amLODipine (NORVASC) 2.5 MG tablet Take 1 tablet (  2.5 mg total) by mouth 2 (two) times daily.  Marland Kitchen aspirin EC 81 MG tablet Take 81 mg by mouth every other day.  . metoprolol succinate (TOPROL-XL) 25 MG 24 hr tablet Take 1 tablet (25 mg total) by mouth daily.  . Multiple Vitamin (MULTIVITAMIN) tablet Take 1 tablet by mouth daily.   Allergies  Allergen Reactions  . Sulfa Antibiotics Itching    Throat tight  . Telmisartan     Heart skipping beats, flank pain and back pain once stopped resolved and nightmares per pt    . Zetia [Ezetimibe]     Back,kidney pain, muscle aches thighs  . Penicillins Rash    Did it involve swelling of the face/tongue/throat, SOB, or low BP? No Did it involve sudden or severe rash/hives, skin peeling, or any reaction on the inside of your mouth or nose? No Did you need to seek medical attention at a hospital or doctor's office? No When did it last happen?childhood If all above answers are "NO", may proceed with  cephalosporin use.   Recent Results (from the past 2160 hour(s))  CBC with Differential/Platelet     Status: None   Collection Time: 08/28/19  8:17 AM  Result Value Ref Range   WBC 4.9 4.0 - 10.5 K/uL   RBC 4.52 3.87 - 5.11 Mil/uL   Hemoglobin 13.9 12.0 - 15.0 g/dL   HCT 39.2 36.0 - 46.0 %   MCV 86.7 78.0 - 100.0 fl   MCHC 35.5 30.0 - 36.0 g/dL   RDW 12.9 11.5 - 15.5 %   Platelets 227.0 150.0 - 400.0 K/uL   Neutrophils Relative % 59.6 43.0 - 77.0 %   Lymphocytes Relative 28.6 12.0 - 46.0 %   Monocytes Relative 9.7 3.0 - 12.0 %   Eosinophils Relative 1.3 0.0 - 5.0 %   Basophils Relative 0.8 0.0 - 3.0 %   Neutro Abs 2.9 1.4 - 7.7 K/uL   Lymphs Abs 1.4 0.7 - 4.0 K/uL   Monocytes Absolute 0.5 0.1 - 1.0 K/uL   Eosinophils Absolute 0.1 0.0 - 0.7 K/uL   Basophils Absolute 0.0 0.0 - 0.1 K/uL  Comprehensive metabolic panel     Status: None   Collection Time: 08/28/19  8:17 AM  Result Value Ref Range   Sodium 136 135 - 145 mEq/L   Potassium 3.8 3.5 - 5.1 mEq/L   Chloride 102 96 - 112 mEq/L   CO2 27 19 - 32 mEq/L   Glucose, Bld 95 70 - 99 mg/dL   BUN 11 6 - 23 mg/dL   Creatinine, Ser 0.74 0.40 - 1.20 mg/dL   Total Bilirubin 0.6 0.2 - 1.2 mg/dL   Alkaline Phosphatase 69 39 - 117 U/L   AST 19 0 - 37 U/L   ALT 15 0 - 35 U/L   Total Protein 6.7 6.0 - 8.3 g/dL   Albumin 4.4 3.5 - 5.2 g/dL   GFR 77.51 >60.00 mL/min   Calcium 9.2 8.4 - 10.5 mg/dL  Lipid panel     Status: Abnormal   Collection Time: 08/28/19  8:17 AM  Result Value Ref Range   Cholesterol 208 (H) 0 - 200 mg/dL    Comment: ATP III Classification       Desirable:  < 200 mg/dL               Borderline High:  200 - 239 mg/dL          High:  > = 240 mg/dL   Triglycerides  77.0 0.0 - 149.0 mg/dL    Comment: Normal:  <150 mg/dLBorderline High:  150 - 199 mg/dL   HDL 57.20 >39.00 mg/dL   VLDL 15.4 0.0 - 40.0 mg/dL   LDL Cholesterol 135 (H) 0 - 99 mg/dL   Total CHOL/HDL Ratio 4     Comment:                Men          Women1/2  Average Risk     3.4          3.3Average Risk          5.0          4.42X Average Risk          9.6          7.13X Average Risk          15.0          11.0                       NonHDL 150.80     Comment: NOTE:  Non-HDL goal should be 30 mg/dL higher than patient's LDL goal (i.e. LDL goal of < 70 mg/dL, would have non-HDL goal of < 100 mg/dL)   Objective  Body mass index is 27.55 kg/m. Wt Readings from Last 3 Encounters:  08/31/19 136 lb 6.4 oz (61.9 kg)  04/27/19 136 lb (61.7 kg)  03/01/19 136 lb (61.7 kg)   Temp Readings from Last 3 Encounters:  08/31/19 (!) 97.5 F (36.4 C) (Temporal)  03/01/19 97.9 F (36.6 C) (Temporal)  10/27/18 98.6 F (37 C) (Oral)   BP Readings from Last 3 Encounters:  08/31/19 130/78  04/27/19 130/79  03/01/19 118/82   Pulse Readings from Last 3 Encounters:  08/31/19 67  04/27/19 72  03/01/19 74    Physical Exam Vitals and nursing note reviewed.  Constitutional:      Appearance: Normal appearance. She is well-developed and well-groomed.  HENT:     Head: Normocephalic and atraumatic.  Eyes:     Conjunctiva/sclera: Conjunctivae normal.     Pupils: Pupils are equal, round, and reactive to light.  Cardiovascular:     Rate and Rhythm: Normal rate and regular rhythm.     Heart sounds: Normal heart sounds. No murmur.  Pulmonary:     Effort: Pulmonary effort is normal.     Breath sounds: Normal breath sounds.  Abdominal:     General: Abdomen is flat. Bowel sounds are normal.     Tenderness: There is no abdominal tenderness.  Skin:    General: Skin is warm and dry.  Neurological:     General: No focal deficit present.     Mental Status: She is alert and oriented to person, place, and time. Mental status is at baseline.     Gait: Gait normal.  Psychiatric:        Attention and Perception: Attention and perception normal.        Mood and Affect: Mood and affect normal.        Speech: Speech normal.        Behavior: Behavior normal. Behavior  is cooperative.        Thought Content: Thought content normal.        Cognition and Memory: Cognition and memory normal.        Judgment: Judgment normal.     Assessment  Plan  Essential hypertension Cont meds  Hyperlipidemia, unspecified hyperlipidemia Stop zetia could not tolerate    HM Flu had 2020 -called [facility] [place] [place] today 10/18/2018 their system only goes back 18 months and pna 23 not in systemconsider in future   prevnar had 05/17/14 brought proof of vx Tdap per pt had 2013  zostervax had 2015has not hadshingrix and declines another shingles vaccine had rash with 1st Declines covid 19 vx for now   Last pap 11/28/15 Dr. Barbera Setters neg pap +atrophy HPV neg Last colonoscopy 2015 nl per pt need to get records per pt they rec in 10 years no h/o polyps she has had colonoscopy x 2 -request Dr. Fayrene Fearing [facility] today signed to get records -no records received b/c no records -colonoscopy ~8 years ago due ~70   My eye doctor seen 08/2019 DEXAlast had >10 years ago will think about anotherdiscprev mammo9/3/19 normalordered upcoming Disc hep B/C/MMRtesting pt declines for now  No skin issues Provider: Dr. Olivia Mackie [name]-Internal Medicine

## 2019-08-31 NOTE — Patient Instructions (Signed)
Pneumococcal Polysaccharide Vaccine (PPSV23): What You Need to Know 1. Why get vaccinated? Pneumococcal polysaccharide vaccine (PPSV23) can prevent pneumococcal disease. Pneumococcal disease refers to any illness caused by pneumococcal bacteria. These bacteria can cause many types of illnesses, including pneumonia, which is an infection of the lungs. Pneumococcal bacteria are one of the most common causes of pneumonia. Besides pneumonia, pneumococcal bacteria can also cause:  Ear infections  Sinus infections  Meningitis (infection of the tissue covering the brain and spinal cord)  Bacteremia (bloodstream infection) Anyone can get pneumococcal disease, but children under 2 years of age, people with certain medical conditions, adults 65 years or older, and cigarette smokers are at the highest risk. Most pneumococcal infections are mild. However, some can result in long-term problems, such as brain damage or hearing loss. Meningitis, bacteremia, and pneumonia caused by pneumococcal disease can be fatal. 2. PPSV23 PPSV23 protects against 23 types of bacteria that cause pneumococcal disease. PPSV23 is recommended for:  All adults 65 years or older,  Anyone 2 years or older with certain medical conditions that can lead to an increased risk for pneumococcal disease. Most people need only one dose of PPSV23. A second dose of PPSV23, and another type of pneumococcal vaccine called PCV13, are recommended for certain high-risk groups. Your health care provider can give you more information. People 65 years or older should get a dose of PPSV23 even if they have already gotten one or more doses of the vaccine before they turned 65. 3. Talk with your health care provider Tell your vaccine provider if the person getting the vaccine:  Has had an allergic reaction after a previous dose of PPSV23, or has any severe, life-threatening allergies. In some cases, your health care provider may decide to postpone  PPSV23 vaccination to a future visit. People with minor illnesses, such as a cold, may be vaccinated. People who are moderately or severely ill should usually wait until they recover before getting PPSV23. Your health care provider can give you more information. 4. Risks of a vaccine reaction  Redness or pain where the shot is given, feeling tired, fever, or muscle aches can happen after PPSV23. People sometimes faint after medical procedures, including vaccination. Tell your provider if you feel dizzy or have vision changes or ringing in the ears. As with any medicine, there is a very remote chance of a vaccine causing a severe allergic reaction, other serious injury, or death. 5. What if there is a serious problem? An allergic reaction could occur after the vaccinated person leaves the clinic. If you see signs of a severe allergic reaction (hives, swelling of the face and throat, difficulty breathing, a fast heartbeat, dizziness, or weakness), call 9-1-1 and get the person to the nearest hospital. For other signs that concern you, call your health care provider. Adverse reactions should be reported to the Vaccine Adverse Event Reporting System (VAERS). Your health care provider will usually file this report, or you can do it yourself. Visit the VAERS website at www.vaers.hhs.gov or call 1-800-822-7967. VAERS is only for reporting reactions, and VAERS staff do not give medical advice. 6. How can I learn more?  Ask your health care provider.  Call your local or state health department.  Contact the Centers for Disease Control and Prevention (CDC): ? Call 1-800-232-4636 (1-800-CDC-INFO) or ? Visit CDC's website at www.cdc.gov/vaccines CDC Vaccine Information Statement PPSV23 Vaccine (03/16/2018) This information is not intended to replace advice given to you by your health care provider. Make sure you   discuss any questions you have with your health care provider. Document Revised: 08/23/2018  Document Reviewed: 12/14/2017 Elsevier Patient Education  2020 Elsevier Inc.  

## 2019-09-01 ENCOUNTER — Telehealth: Payer: Self-pay | Admitting: Internal Medicine

## 2019-09-01 DIAGNOSIS — G4733 Obstructive sleep apnea (adult) (pediatric): Secondary | ICD-10-CM | POA: Diagnosis not present

## 2019-09-01 NOTE — Telephone Encounter (Signed)
Erica Valencia, [facility]  09/01/2019 3:33 PM EDT    Left message to return call.  Letter mailed.    Erica Valencia, [facility]  08/29/2019 3:43 PM EDT    Left message to return call.    [name], [facility]  08/28/2019 4:52 PM EDT    Left message for patient to return call back for lab results.    [name], [name], MD  08/28/2019 11:06 AM EDT    Liver kidneys normal  Cholesterol improved still not at goal  Does she want to add a statin to zetia?  Blood cts normal

## 2019-10-03 ENCOUNTER — Telehealth: Payer: Self-pay | Admitting: Internal Medicine

## 2019-10-03 NOTE — Telephone Encounter (Signed)
Dr French Ana is requesting the patient's last colonoscopy from them. Put this information on the form and sent to fax.

## 2019-10-03 NOTE — Telephone Encounter (Signed)
Released faxed to [facility] on 10/03/19

## 2019-10-03 NOTE — Telephone Encounter (Signed)
Pt dropped off form from [employer] in [place]. Please complete and fax to number on form. Placed in colored front at front

## 2019-10-06 DIAGNOSIS — G4733 Obstructive sleep apnea (adult) (pediatric): Secondary | ICD-10-CM | POA: Diagnosis not present

## 2019-10-13 ENCOUNTER — Encounter: Payer: Self-pay | Admitting: Internal Medicine

## 2019-10-23 DIAGNOSIS — R69 Illness, unspecified: Secondary | ICD-10-CM | POA: Diagnosis not present

## 2019-11-02 NOTE — Telephone Encounter (Signed)
ROI sent to scan 

## 2019-11-06 DIAGNOSIS — G4733 Obstructive sleep apnea (adult) (pediatric): Secondary | ICD-10-CM | POA: Diagnosis not present

## 2019-11-07 ENCOUNTER — Encounter: Payer: Self-pay | Admitting: Internal Medicine

## 2019-11-17 ENCOUNTER — Other Ambulatory Visit: Payer: Self-pay

## 2019-11-17 ENCOUNTER — Telehealth (INDEPENDENT_AMBULATORY_CARE_PROVIDER_SITE_OTHER): Payer: Medicare HMO | Admitting: Internal Medicine

## 2019-11-17 ENCOUNTER — Encounter: Payer: Self-pay | Admitting: Internal Medicine

## 2019-11-17 VITALS — Ht 59.0 in | Wt 130.0 lb

## 2019-11-17 DIAGNOSIS — J069 Acute upper respiratory infection, unspecified: Secondary | ICD-10-CM

## 2019-11-17 DIAGNOSIS — J329 Chronic sinusitis, unspecified: Secondary | ICD-10-CM

## 2019-11-17 DIAGNOSIS — J209 Acute bronchitis, unspecified: Secondary | ICD-10-CM | POA: Diagnosis not present

## 2019-11-17 MED ORDER — AZITHROMYCIN 250 MG PO TABS: ORAL_TABLET | ORAL | 0 refills | Status: DC

## 2019-11-17 MED ORDER — ALBUTEROL SULFATE HFA 108 (90 BASE) MCG/ACT IN AERS: 1.0000 | INHALATION_SPRAY | Freq: Four times a day (QID) | RESPIRATORY_TRACT | 0 refills | Status: DC | PRN

## 2019-11-17 NOTE — Progress Notes (Signed)
Virtual Visit via Video Note  I connected with [name]  on 11/17/19 at 10:20 AM EDT by a video enabled telemedicine application and verified that I am speaking with the correct person using two identifiers.  Location patient: home Location provider:work or home office Persons participating in the virtual visit: patient, provider  I discussed the limitations of evaluation and management by telemedicine and the availability of in person appointments. The patient expressed understanding and agreed to proceed.   HPI: 1. Cough, congestion sore throat, fever, chest congestion and wheezing x 4 days tried mucinex and nighttime cold and sinus and tea with honey/lemon, cough drops and airborne, vitamin C,D, E  Denies covid exposure wearing 2 masks and doing hand hygiene  ROS: See pertinent positives and negatives per HPI.  Past Medical History:  Diagnosis Date  . Allergy   . Hyperlipidemia   . Hypertension   . Scarlet fever    as child caused vision issues has to wear glasses now    Past Surgical History:  Procedure Laterality Date  . APPENDECTOMY     age [age]  . CARPAL TUNNEL RELEASE     left 2008  . OTHER SURGICAL HISTORY     left great toe fusion 2012     Family History  Problem Relation Age of Onset  . Other Mother        brain tumor died 60  . Heart attack Father        9s  . Breast cancer Neg Hx     SOCIAL HX: married lives with husband   Current Outpatient Medications:  .  amLODipine (NORVASC) 2.5 MG tablet, Take 1 tablet (2.5 mg total) by mouth 2 (two) times daily., Disp: 180 tablet, Rfl: 3 .  aspirin EC 81 MG tablet, Take 81 mg by mouth every other day., Disp: , Rfl:  .  metoprolol succinate (TOPROL-XL) 25 MG 24 hr tablet, Take 1 tablet (25 mg total) by mouth daily., Disp: 90 tablet, Rfl: 3 .  Multiple Vitamin (MULTIVITAMIN) tablet, Take 1 tablet by mouth daily., Disp: , Rfl:  .  albuterol (VENTOLIN HFA) 108 (90 Base) MCG/ACT inhaler, Inhale 1-2 puffs into  the lungs every 6 (six) hours as needed for wheezing or shortness of breath., Disp: 18 g, Rfl: 0 .  azithromycin (ZITHROMAX) 250 MG tablet, 2 pills day 1 and 1 pill day 2-5, Disp: 6 tablet, Rfl: 0  EXAM:  VITALS per patient if applicable:  GENERAL: alert, oriented, appears well and in no acute distress  HEENT: atraumatic, conjunttiva clear, no obvious abnormalities on inspection of external nose and ears  NECK: normal movements of the head and neck  LUNGS: on inspection no signs of respiratory distress, breathing rate appears normal, no obvious gross SOB, gasping or wheezing  CV: no obvious cyanosis  MS: moves all visible extremities without noticeable abnormality  PSYCH/NEURO: pleasant and cooperative, no obvious depression or anxiety, speech and thought processing grossly intact  ASSESSMENT AND PLAN:  Discussed the following assessment and plan:  Sinusitis, unspecified chronicity, unspecified location - Plan: azithromycin (ZITHROMAX) 250 MG tablet, albuterol (VENTOLIN HFA) 108 (90 Base) MCG/ACT inhaler  Upper respiratory tract infection, unspecified type - Plan: azithromycin (ZITHROMAX) 250 MG tablet, albuterol (VENTOLIN HFA) 108 (90 Base) MCG/ACT inhaler  Acute bronchitis, unspecified organism - Plan: azithromycin (ZITHROMAX) 250 MG tablet, albuterol (VENTOLIN HFA) 108 (90 Base) MCG/ACT inhaler mucinex dm    -we discussed possible serious and likely etiologies, options for evaluation and workup, limitations of  telemedicine visit vs in person visit, treatment, treatment risks and precautions. Pt prefers to treat via telemedicine empirically rather then risking or undertaking an in person visit at this moment. Patient agrees to seek prompt in person care if worsening, new symptoms arise, or if is not improving with treatment.   I discussed the assessment and treatment plan with the patient. The patient was provided an opportunity to ask questions and all were answered. The patient  agreed with the plan and demonstrated an understanding of the instructions.   The patient was advised to call back or seek an in-person evaluation if the symptoms worsen or if the condition fails to improve as anticipated.  Time spent 20 minutes Bevelyn Buckles, MD

## 2019-11-17 NOTE — Progress Notes (Signed)
Symptoms includes, congestion, cough. Clear mucous from cough and blowing the nose, ongoing for 4 days.   Took cough syrup but only helps for a short amount of time. Has a history of sinus infections. Also has a history of seasonal allergies.

## 2019-12-04 ENCOUNTER — Ambulatory Visit (INDEPENDENT_AMBULATORY_CARE_PROVIDER_SITE_OTHER): Payer: Medicare HMO

## 2019-12-04 VITALS — Ht 59.0 in | Wt 130.0 lb

## 2019-12-04 DIAGNOSIS — Z0000 Encounter for general adult medical examination without abnormal findings: Secondary | ICD-10-CM | POA: Diagnosis not present

## 2019-12-04 NOTE — Progress Notes (Signed)
Subjective:   Tomorrow Erica Valencia is a 69 y.o. female who presents for Medicare Annual (Subsequent) preventive examination.  Review of Systems    No ROS.  Medicare Wellness Virtual Visit.   Cardiac Risk Factors include: advanced age (>44men, >15 women);hypertension     Objective:    Today's Vitals   12/04/19 1409  Weight: 130 lb (59 kg)  Height: 4\' 11"  (1.499 m)   Body mass index is 26.26 kg/m.  Advanced Directives 12/04/2019 09/12/2018 11/08/2017  Does Patient Have a Medical Advance Directive? Yes No No  Type of Advance Directive Living will - -  Does patient want to make changes to medical advance directive? No - Patient declined - -  Would patient like information on creating a medical advance directive? - No - Patient declined No - Patient declined    Current Medications (verified) Outpatient Encounter Medications as of 12/04/2019  Medication Sig  . albuterol (VENTOLIN HFA) 108 (90 Base) MCG/ACT inhaler Inhale 1-2 puffs into the lungs every 6 (six) hours as needed for wheezing or shortness of breath.  12/06/2019 amLODipine (NORVASC) 2.5 MG tablet Take 1 tablet (2.5 mg total) by mouth 2 (two) times daily.  Marland Kitchen aspirin EC 81 MG tablet Take 81 mg by mouth every other day.  Marland Kitchen azithromycin (ZITHROMAX) 250 MG tablet 2 pills day 1 and 1 pill day 2-5  . metoprolol succinate (TOPROL-XL) 25 MG 24 hr tablet Take 1 tablet (25 mg total) by mouth daily.  . Multiple Vitamin (MULTIVITAMIN) tablet Take 1 tablet by mouth daily.   No facility-administered encounter medications on file as of 12/04/2019.    Allergies (verified) Sulfa antibiotics, Telmisartan, Zetia [ezetimibe], and Penicillins   History: Past Medical History:  Diagnosis Date  . Allergy   . Hyperlipidemia   . Hypertension   . Scarlet fever    as child caused vision issues has to wear glasses now   Past Surgical History:  Procedure Laterality Date  . APPENDECTOMY     age [age]  . CARPAL TUNNEL RELEASE     left 2008  . OTHER  SURGICAL HISTORY     left great toe fusion 2012    Family History  Problem Relation Age of Onset  . Other Mother        brain tumor died 33  . Heart attack Father        71s  . Breast cancer Neg Hx    Social History   Socioeconomic History  . Marital status: Married    Spouse name: Not on file  . Number of children: Not on file  . Years of education: Not on file  . Highest education level: Not on file  Occupational History  . Not on file  Tobacco Use  . Smoking status: Never Smoker  . Smokeless tobacco: Never Used  Vaping Use  . Vaping Use: Never used  Substance and Sexual Activity  . Alcohol use: No  . Drug use: No  . Sexual activity: Never    Partners: Male  Other Topics Concern  . Not on file  Social History Narrative   Moved from [place] 2018   Mother was 2019 she was born in [place] father American soldier never knew    Married    Retired from [employer] daily 20 minutes x 3 intervals    Social Determinants of Patent examiner Strain: Low Risk   . Difficulty of Paying Living Expenses: Not hard at  all  Food Insecurity: No Food Insecurity  . Worried About Programme researcher, broadcasting/film/video in the Last Year: Never true  . Ran Out of Food in the Last Year: Never true  Transportation Needs: No Transportation Needs  . Lack of Transportation (Medical): No  . Lack of Transportation (Non-Medical): No  Physical Activity: Sufficiently Active  . Days of Exercise per Week: 5 days  . Minutes of Exercise per Session: 30 min  Stress: No Stress Concern Present  . Feeling of Stress : Not at all  Social Connections: Unknown  . Frequency of Communication with Friends and Family: Not on file  . Frequency of Social Gatherings with Friends and Family: Not on file  . Attends Religious Services: Not on file  . Active Member of Clubs or Organizations: Not on file  . Attends Banker Meetings: Not on file  . Marital Status: Married     Tobacco Counseling Counseling given: Not Answered   Clinical Intake:  Pre-visit preparation completed: Yes           How often do you need to have someone help you when you read instructions, pamphlets, or other written materials from your doctor or pharmacy?: 1 - Never   Interpreter Needed?: No      Activities of Daily Living In your present state of health, do you have any difficulty performing the following activities: 12/04/2019 04/27/2019  Hearing? N N  Vision? N N  Difficulty concentrating or making decisions? N N  Walking or climbing stairs? N N  Dressing or bathing? N N  Doing errands, shopping? N N  Preparing Food and eating ? N -  Using the Toilet? N -  In the past six months, have you accidently leaked urine? N -  Do you have problems with loss of bowel control? N -  Managing your Medications? N -  Managing your Finances? N -  Housekeeping or managing your Housekeeping? N -  Some recent data might be hidden    Patient Care Team: [name], [name], MD as PCP - General (Internal Medicine)  Indicate any recent Medical Services you may have received from other than [facility] providers in the past year (date may be approximate).     Assessment:   This is a routine wellness examination for [place].  I connected with Tyffani today by telephone and verified that I am speaking with the correct person using two identifiers. Location patient: home Location provider: work Persons participating in the virtual visit: patient, Engineer, civil (consulting).    I discussed the limitations, risks, security and privacy concerns of performing an evaluation and management service by telephone and the availability of in person appointments. The patient expressed understanding and verbally consented to this telephonic visit.    Interactive audio and video telecommunications were attempted between this provider and patient, however failed, due to patient having technical difficulties OR  patient did not have access to video capability.  We continued and completed visit with audio only.  Some vital signs may be absent or patient reported.   Hearing/Vision screen  Hearing Screening   125Hz  250Hz  500Hz  1000Hz  2000Hz  3000Hz  4000Hz  6000Hz  8000Hz   Right ear:           Left ear:           Comments: Patient is able to hear conversational tones without difficulty.  No issues reported.  Vision Screening Comments: Wears corrective lenses Cataract extraction, bilateral Visual acuity not assessed, virtual visit.  They have seen their  ophthalmologist in the last 12 months.   Dietary issues and exercise activities discussed: Current Exercise Habits: Home exercise routine, Type of exercise: walking;calisthenics, Time (Minutes): 60, Frequency (Times/Week): 5, Weekly Exercise (Minutes/Week): 300, Intensity: Moderate  Goals    . Increase physical activity     10,000 steps      Depression Screen PHQ 2/9 Scores 12/04/2019 11/17/2019 08/31/2019 10/18/2018 11/08/2017 06/24/2017  PHQ - 2 Score 0 0 0 0 0 0    Fall Risk Fall Risk  12/04/2019 11/17/2019 08/31/2019 12/09/2018 10/18/2018  Falls in the past year? 0 0 0 0 0  Number falls in past yr: 0 0 0 0 -  Injury with Fall? - 0 0 0 -  Follow up Falls evaluation completed Falls evaluation completed Falls evaluation completed Falls evaluation completed -    Handrails in use when climbing stairs? Yes  Home free of loose throw rugs in walkways, pet beds, electrical cords, etc? Yes  Adequate lighting in your home to reduce risk of falls? Yes   ASSISTIVE DEVICES UTILIZED TO PREVENT FALLS: Use of a cane, walker or w/c? No  Grab bars in the bathroom? No  Shower chair or bench in shower? No  Elevated toilet seat or a handicapped toilet? No   TIMED UP AND GO:  Was the test performed? No . Virtual visits.  Cognitive Function: MMSE - Mini Mental State Exam 11/08/2017  Orientation to time 5  Orientation to Place 5  Registration 3  Attention/  Calculation 5  Recall 3  Language- name 2 objects 2  Language- repeat 1  Language- follow 3 step command 3  Language- read & follow direction 1  Write a sentence 1  Copy design 1  Total score 30     6CIT Screen 12/04/2019  What Year? 0 points  What month? 0 points  What time? 0 points   Immunizations Immunization History  Administered Date(s) Administered  . Fluad Quad(high Dose 65+) 01/25/2019  . Influenza, High Dose Seasonal PF 05/22/2017, 03/16/2018  . Influenza-Unspecified 05/25/2017, 03/16/2018   Health Maintenance Health Maintenance  Topic Date Due  . Hepatitis C Screening  Never done  . PNA vac Low Risk Adult (2 of 2 - PPSV23) 05/18/2015  . COVID-19 Vaccine (1) 12/20/2019 (Originally 04/16/1963)  . INFLUENZA VACCINE  12/17/2019  . MAMMOGRAM  01/23/2021  . TETANUS/TDAP  05/18/2021  . COLONOSCOPY  05/19/2023  . DEXA SCAN  Completed   Hepatitis C Screening- deferred per patient preference.   Covid vaccine- declined.   Pneumococcal vaccine- notes these vaccines were completed at previous PMD. Update immunization record.  Dental Screening: Recommended annual dental exams for proper oral hygiene  Community Resource Referral / Chronic Care Management: CRR required this visit?  No   CCM required this visit?  No     Plan:   Keep all routine maintenance appointments.   Next scheduled fasting lab 02/26/20 @ 8:30  Follow up 03/01/20 @ 9:00  I have personally reviewed and noted the following in the patient's chart:   . Medical and social history . Use of alcohol, tobacco or illicit drugs  . Current medications and supplements . Functional ability and status . Nutritional status . Physical activity . Advanced directives . List of other physicians . Hospitalizations, surgeries, and ER visits in previous 12 months . Vitals . Screenings to include cognitive, depression, and falls . Referrals and appointments  In addition, I have reviewed and discussed with  patient certain preventive protocols, quality metrics, and best  practice recommendations. A written personalized care plan for preventive services as well as general preventive health recommendations were provided to patient via mychart.     Ashok Pall, LPN   01/16/5175

## 2019-12-04 NOTE — Patient Instructions (Addendum)
Erica Valencia , Thank you for taking time to come for your Medicare Wellness Visit. I appreciate your ongoing commitment to your health goals. Please review the following plan we discussed and let me know if I can assist you in the future.   These are the goals we discussed: Goals    . Increase physical activity     10,000 steps       This is a list of the screening recommended for you and due dates:  Health Maintenance  Topic Date Due  .  Hepatitis C: One time screening is recommended by Center for Disease Control  (CDC) for  adults born from [birth year] through 1967.   Never done  . Pneumonia vaccines (2 of 2 - PPSV23) 05/18/2015  . COVID-19 Vaccine (1) 12/20/2019*  . Flu Shot  12/17/2019  . Mammogram  01/23/2021  . Tetanus Vaccine  05/18/2021  . Colon Cancer Screening  05/19/2023  . DEXA scan (bone density measurement)  Completed  *Topic was postponed. The date shown is not the original due date.    Immunizations Immunization History  Administered Date(s) Administered  . Fluad Quad(high Dose 65+) 01/25/2019  . Influenza, High Dose Seasonal PF 05/22/2017, 03/16/2018  . Influenza-Unspecified 05/25/2017, 03/16/2018   Keep all routine maintenance appointments.   Next scheduled fasting lab 02/26/20 @ 8:30  Follow up 03/01/20 @ 9:00  Advanced directives:   Conditions/risks identified: none new  Follow up in one year for your annual wellness visit.   Preventive Care 71 Years and Older, Female Preventive care refers to lifestyle choices and visits with your health care provider that can promote health and wellness. What does preventive care include?  A yearly physical exam. This is also called an annual well check.  Dental exams once or twice a year.  Routine eye exams. Ask your health care provider how often you should have your eyes checked.  Personal lifestyle choices, including:  Daily care of your teeth and gums.  Regular physical activity.  Eating a healthy  diet.  Avoiding tobacco and drug use.  Limiting alcohol use.  Practicing safe sex.  Taking low-dose aspirin every day.  Taking vitamin and mineral supplements as recommended by your health care provider. What happens during an annual well check? The services and screenings done by your health care provider during your annual well check will depend on your age, overall health, lifestyle risk factors, and family history of disease. Counseling  Your health care provider may ask you questions about your:  Alcohol use.  Tobacco use.  Drug use.  Emotional well-being.  Home and relationship well-being.  Sexual activity.  Eating habits.  History of falls.  Memory and ability to understand (cognition).  Work and work Astronomer.  Reproductive health. Screening  You may have the following tests or measurements:  Height, weight, and BMI.  Blood pressure.  Lipid and cholesterol levels. These may be checked every 5 years, or more frequently if you are over 4 years old.  Skin check.  Lung cancer screening. You may have this screening every year starting at age 71 if you have a 30-pack-year history of smoking and currently smoke or have quit within the past 15 years.  Fecal occult blood test (FOBT) of the stool. You may have this test every year starting at age 65.  Flexible sigmoidoscopy or colonoscopy. You may have a sigmoidoscopy every 5 years or a colonoscopy every 10 years starting at age 32.  Hepatitis C blood test.  Hepatitis  B blood test.  Sexually transmitted disease (STD) testing.  Diabetes screening. This is done by checking your blood sugar (glucose) after you have not eaten for a while (fasting). You may have this done every 1-3 years.  Bone density scan. This is done to screen for osteoporosis. You may have this done starting at age 48.  Mammogram. This may be done every 1-2 years. Talk to your health care provider about how often you should have  regular mammograms. Talk with your health care provider about your test results, treatment options, and if necessary, the need for more tests. Vaccines  Your health care provider may recommend certain vaccines, such as:  Influenza vaccine. This is recommended every year.  Tetanus, diphtheria, and acellular pertussis (Tdap, Td) vaccine. You may need a Td booster every 10 years.  Zoster vaccine. You may need this after age 16.  Pneumococcal 13-valent conjugate (PCV13) vaccine. One dose is recommended after age 71.  Pneumococcal polysaccharide (PPSV23) vaccine. One dose is recommended after age 71. Talk to your health care provider about which screenings and vaccines you need and how often you need them. This information is not intended to replace advice given to you by your health care provider. Make sure you discuss any questions you have with your health care provider. Document Released: 05/31/2015 Document Revised: 01/22/2016 Document Reviewed: 03/05/2015 Elsevier Interactive Patient Education  2017 [employer].  Fall Prevention in the Home Falls can cause injuries. They can happen to people of all ages. There are many things you can do to make your home safe and to help prevent falls. What can I do on the outside of my home?  Regularly fix the edges of walkways and driveways and fix any cracks.  Remove anything that might make you trip as you walk through a door, such as a raised step or threshold.  Trim any bushes or trees on the path to your home.  Use bright outdoor lighting.  Clear any walking paths of anything that might make someone trip, such as rocks or tools.  Regularly check to see if handrails are loose or broken. Make sure that both sides of any steps have handrails.  Any raised decks and porches should have guardrails on the edges.  Have any leaves, snow, or ice cleared regularly.  Use sand or salt on walking paths during winter.  Clean up any spills in your  garage right away. This includes oil or grease spills. What can I do in the bathroom?  Use night lights.  Install grab bars by the toilet and in the tub and shower. Do not use towel bars as grab bars.  Use non-skid mats or decals in the tub or shower.  If you need to sit down in the shower, use a plastic, non-slip stool.  Keep the floor dry. Clean up any water that spills on the floor as soon as it happens.  Remove soap buildup in the tub or shower regularly.  Attach bath mats securely with double-sided non-slip rug tape.  Do not have throw rugs and other things on the floor that can make you trip. What can I do in the bedroom?  Use night lights.  Make sure that you have a light by your bed that is easy to reach.  Do not use any sheets or blankets that are too big for your bed. They should not hang down onto the floor.  Have a firm chair that has side arms. You can use this for  support while you get dressed.  Do not have throw rugs and other things on the floor that can make you trip. What can I do in the kitchen?  Clean up any spills right away.  Avoid walking on wet floors.  Keep items that you use a lot in easy-to-reach places.  If you need to reach something above you, use a strong step stool that has a grab bar.  Keep electrical cords out of the way.  Do not use floor polish or wax that makes floors slippery. If you must use wax, use non-skid floor wax.  Do not have throw rugs and other things on the floor that can make you trip. What can I do with my stairs?  Do not leave any items on the stairs.  Make sure that there are handrails on both sides of the stairs and use them. Fix handrails that are broken or loose. Make sure that handrails are as long as the stairways.  Check any carpeting to make sure that it is firmly attached to the stairs. Fix any carpet that is loose or worn.  Avoid having throw rugs at the top or bottom of the stairs. If you do have throw  rugs, attach them to the floor with carpet tape.  Make sure that you have a light switch at the top of the stairs and the bottom of the stairs. If you do not have them, ask someone to add them for you. What else can I do to help prevent falls?  Wear shoes that:  Do not have high heels.  Have rubber bottoms.  Are comfortable and fit you well.  Are closed at the toe. Do not wear sandals.  If you use a stepladder:  Make sure that it is fully opened. Do not climb a closed stepladder.  Make sure that both sides of the stepladder are locked into place.  Ask someone to hold it for you, if possible.  Clearly mark and make sure that you can see:  Any grab bars or handrails.  First and last steps.  Where the edge of each step is.  Use tools that help you move around (mobility aids) if they are needed. These include:  Canes.  Walkers.  Scooters.  Crutches.  Turn on the lights when you go into a dark area. Replace any light bulbs as soon as they burn out.  Set up your furniture so you have a clear path. Avoid moving your furniture around.  If any of your floors are uneven, fix them.  If there are any pets around you, be aware of where they are.  Review your medicines with your doctor. Some medicines can make you feel dizzy. This can increase your chance of falling. Ask your doctor what other things that you can do to help prevent falls. This information is not intended to replace advice given to you by your health care provider. Make sure you discuss any questions you have with your health care provider. Document Released: 02/28/2009 Document Revised: 10/10/2015 Document Reviewed: 06/08/2014 Elsevier Interactive Patient Education  2017 [employer].

## 2019-12-06 DIAGNOSIS — G4733 Obstructive sleep apnea (adult) (pediatric): Secondary | ICD-10-CM | POA: Diagnosis not present

## 2019-12-11 ENCOUNTER — Encounter: Payer: Self-pay | Admitting: Internal Medicine

## 2019-12-11 NOTE — Telephone Encounter (Signed)
Last colonoscopy was actually 09/2008 so pt is due now  Is she agreeable for referral to colonoscopy?   TMS

## 2019-12-11 NOTE — Telephone Encounter (Signed)
Have you received this?

## 2019-12-11 NOTE — Telephone Encounter (Signed)
Patient declines at this time. States she will be able to do this later this year but does not want to do this now.

## 2019-12-11 NOTE — Telephone Encounter (Signed)
Noted  Dr. TMS 

## 2019-12-14 ENCOUNTER — Other Ambulatory Visit: Payer: Self-pay | Admitting: Internal Medicine

## 2019-12-14 DIAGNOSIS — J069 Acute upper respiratory infection, unspecified: Secondary | ICD-10-CM

## 2019-12-14 DIAGNOSIS — J329 Chronic sinusitis, unspecified: Secondary | ICD-10-CM

## 2019-12-14 DIAGNOSIS — J209 Acute bronchitis, unspecified: Secondary | ICD-10-CM

## 2019-12-14 MED ORDER — ALBUTEROL SULFATE HFA 108 (90 BASE) MCG/ACT IN AERS: 1.0000 | INHALATION_SPRAY | Freq: Four times a day (QID) | RESPIRATORY_TRACT | 2 refills | Status: DC | PRN

## 2020-01-02 ENCOUNTER — Encounter: Payer: Self-pay | Admitting: Internal Medicine

## 2020-01-05 DIAGNOSIS — G4733 Obstructive sleep apnea (adult) (pediatric): Secondary | ICD-10-CM | POA: Diagnosis not present

## 2020-02-26 ENCOUNTER — Other Ambulatory Visit: Payer: Medicare HMO

## 2020-03-01 ENCOUNTER — Ambulatory Visit: Payer: Medicare HMO | Admitting: Internal Medicine

## 2020-04-15 ENCOUNTER — Other Ambulatory Visit: Payer: Self-pay

## 2020-04-15 ENCOUNTER — Other Ambulatory Visit (INDEPENDENT_AMBULATORY_CARE_PROVIDER_SITE_OTHER): Payer: Medicare HMO

## 2020-04-15 DIAGNOSIS — E785 Hyperlipidemia, unspecified: Secondary | ICD-10-CM | POA: Diagnosis not present

## 2020-04-15 DIAGNOSIS — Z1389 Encounter for screening for other disorder: Secondary | ICD-10-CM | POA: Diagnosis not present

## 2020-04-15 DIAGNOSIS — G4733 Obstructive sleep apnea (adult) (pediatric): Secondary | ICD-10-CM | POA: Diagnosis not present

## 2020-04-15 DIAGNOSIS — I10 Essential (primary) hypertension: Secondary | ICD-10-CM

## 2020-04-15 LAB — CBC WITH DIFFERENTIAL/PLATELET
Basophils Absolute: 0 10*3/uL (ref 0.0–0.1)
Basophils Relative: 0.5 % (ref 0.0–3.0)
Eosinophils Absolute: 0.1 10*3/uL (ref 0.0–0.7)
Eosinophils Relative: 1.5 % (ref 0.0–5.0)
HCT: 41.4 % (ref 36.0–46.0)
Hemoglobin: 14.6 g/dL (ref 12.0–15.0)
Lymphocytes Relative: 28.5 % (ref 12.0–46.0)
Lymphs Abs: 1.7 10*3/uL (ref 0.7–4.0)
MCHC: 35.3 g/dL (ref 30.0–36.0)
MCV: 85.1 fl (ref 78.0–100.0)
Monocytes Absolute: 0.6 10*3/uL (ref 0.1–1.0)
Monocytes Relative: 10.7 % (ref 3.0–12.0)
Neutro Abs: 3.5 10*3/uL (ref 1.4–7.7)
Neutrophils Relative %: 58.8 % (ref 43.0–77.0)
Platelets: 257 10*3/uL (ref 150.0–400.0)
RBC: 4.87 Mil/uL (ref 3.87–5.11)
RDW: 12.9 % (ref 11.5–15.5)
WBC: 6 10*3/uL (ref 4.0–10.5)

## 2020-04-15 LAB — LIPID PANEL
Cholesterol: 199 mg/dL (ref 0–200)
HDL: 55.1 mg/dL (ref >39.00)
LDL Cholesterol: 119 mg/dL — ABNORMAL HIGH (ref 0–99)
NonHDL: 144.28
Total CHOL/HDL Ratio: 4
Triglycerides: 127 mg/dL (ref 0.0–149.0)
VLDL: 25.4 mg/dL (ref 0.0–40.0)

## 2020-04-15 LAB — COMPREHENSIVE METABOLIC PANEL
ALT: 15 U/L (ref 0–35)
AST: 19 U/L (ref 0–37)
Albumin: 4.5 g/dL (ref 3.5–5.2)
Alkaline Phosphatase: 68 U/L (ref 39–117)
BUN: 16 mg/dL (ref 6–23)
CO2: 30 mEq/L (ref 19–32)
Calcium: 9.5 mg/dL (ref 8.4–10.5)
Chloride: 101 mEq/L (ref 96–112)
Creatinine, Ser: 0.7 mg/dL (ref 0.40–1.20)
GFR: 87.27 mL/min (ref >60.00)
Glucose, Bld: 90 mg/dL (ref 70–99)
Potassium: 4.4 mEq/L (ref 3.5–5.1)
Sodium: 138 mEq/L (ref 135–145)
Total Bilirubin: 0.7 mg/dL (ref 0.2–1.2)
Total Protein: 7.3 g/dL (ref 6.0–8.3)

## 2020-04-16 LAB — URINALYSIS, ROUTINE W REFLEX MICROSCOPIC
Bacteria, UA: NONE SEEN /HPF
Bilirubin Urine: NEGATIVE
Glucose, UA: NEGATIVE
Hgb urine dipstick: NEGATIVE
Hyaline Cast: NONE SEEN /LPF
Ketones, ur: NEGATIVE
Nitrite: NEGATIVE
Protein, ur: NEGATIVE
RBC / HPF: NONE SEEN /HPF (ref 0–2)
Specific Gravity, Urine: 1.002 (ref 1.001–1.03)
Squamous Epithelial / HPF: NONE SEEN /HPF (ref <=5)
WBC, UA: NONE SEEN /HPF (ref 0–5)
pH: 6.5 (ref 5.0–8.0)

## 2020-04-17 ENCOUNTER — Ambulatory Visit (INDEPENDENT_AMBULATORY_CARE_PROVIDER_SITE_OTHER): Payer: Medicare HMO | Admitting: Internal Medicine

## 2020-04-17 ENCOUNTER — Other Ambulatory Visit: Payer: Self-pay

## 2020-04-17 ENCOUNTER — Encounter: Payer: Self-pay | Admitting: Internal Medicine

## 2020-04-17 VITALS — BP 124/80 | HR 45 | Temp 97.8°F | Ht 59.0 in | Wt 138.0 lb

## 2020-04-17 DIAGNOSIS — Z0000 Encounter for general adult medical examination without abnormal findings: Secondary | ICD-10-CM | POA: Insufficient documentation

## 2020-04-17 DIAGNOSIS — R739 Hyperglycemia, unspecified: Secondary | ICD-10-CM

## 2020-04-17 DIAGNOSIS — M810 Age-related osteoporosis without current pathological fracture: Secondary | ICD-10-CM

## 2020-04-17 DIAGNOSIS — Z1329 Encounter for screening for other suspected endocrine disorder: Secondary | ICD-10-CM | POA: Diagnosis not present

## 2020-04-17 DIAGNOSIS — E785 Hyperlipidemia, unspecified: Secondary | ICD-10-CM | POA: Diagnosis not present

## 2020-04-17 DIAGNOSIS — I10 Essential (primary) hypertension: Secondary | ICD-10-CM | POA: Diagnosis not present

## 2020-04-17 MED ORDER — AMLODIPINE BESYLATE 2.5 MG PO TABS: 2.5000 mg | ORAL_TABLET | Freq: Two times a day (BID) | ORAL | 3 refills | Status: DC

## 2020-04-17 MED ORDER — METOPROLOL SUCCINATE ER 25 MG PO TB24: 25.0000 mg | ORAL_TABLET | Freq: Every day | ORAL | 3 refills | Status: DC

## 2020-04-17 NOTE — Patient Instructions (Addendum)
Consider [place] [facility] or [facility] in [place] or [facility] in [place] when ready spring 2022 for colononscopy  Calcium 600 mg daily nature made  Vitamin D 2000 IU daily D3   Mammogram and bone density due 01/2021  Call [facility] to schedule when ready    Consider pneumococcal 23 vaccine in the future   Denosumab injection What is this medicine? DENOSUMAB (den oh sue mab) slows bone breakdown. Prolia is used to treat osteoporosis in women after menopause and in men, and in people who are taking corticosteroids for 6 months or more. Delton See is used to treat a high calcium level due to cancer and to prevent bone fractures and other bone problems caused by multiple myeloma or cancer bone metastases. Delton See is also used to treat giant cell tumor of the bone. This medicine may be used for other purposes; ask your health care provider or pharmacist if you have questions. COMMON BRAND NAME(S): Prolia, XGEVA What should I tell my health care provider before I take this medicine? They need to know if you have any of these conditions:  dental disease  having surgery or tooth extraction  infection  kidney disease  low levels of calcium or Vitamin D in the blood  malnutrition  on hemodialysis  skin conditions or sensitivity  thyroid or parathyroid disease  an unusual reaction to denosumab, other medicines, foods, dyes, or preservatives  pregnant or trying to get pregnant  breast-feeding How should I use this medicine? This medicine is for injection under the skin. It is given by a health care professional in a hospital or clinic setting. A special MedGuide will be given to you before each treatment. Be sure to read this information carefully each time. For Prolia, talk to your pediatrician regarding the use of this medicine in children. Special care may be needed. For Delton See, talk to your pediatrician regarding the use of this medicine in children. While this drug may be prescribed for  children as young as 13 years for selected conditions, precautions do apply. Overdosage: If you think you have taken too much of this medicine contact a poison control center or emergency room at once. NOTE: This medicine is only for you. Do not share this medicine with others. What if I miss a dose? It is important not to miss your dose. Call your doctor or health care professional if you are unable to keep an appointment. What may interact with this medicine? Do not take this medicine with any of the following medications:  other medicines containing denosumab This medicine may also interact with the following medications:  medicines that lower your chance of fighting infection  steroid medicines like prednisone or cortisone This list may not describe all possible interactions. Give your health care provider a list of all the medicines, herbs, non-prescription drugs, or dietary supplements you use. Also tell them if you smoke, drink alcohol, or use illegal drugs. Some items may interact with your medicine. What should I watch for while using this medicine? Visit your doctor or health care professional for regular checks on your progress. Your doctor or health care professional may order blood tests and other tests to see how you are doing. Call your doctor or health care professional for advice if you get a fever, chills or sore throat, or other symptoms of a cold or flu. Do not treat yourself. This drug may decrease your body's ability to fight infection. Try to avoid being around people who are sick. You should make  sure you get enough calcium and vitamin D while you are taking this medicine, unless your doctor tells you not to. Discuss the foods you eat and the vitamins you take with your health care professional. See your dentist regularly. Brush and floss your teeth as directed. Before you have any dental work done, tell your dentist you are receiving this medicine. Do not become pregnant  while taking this medicine or for 5 months after stopping it. Talk with your doctor or health care professional about your birth control options while taking this medicine. Women should inform their doctor if they wish to become pregnant or think they might be pregnant. There is a potential for serious side effects to an unborn child. Talk to your health care professional or pharmacist for more information. What side effects may I notice from receiving this medicine? Side effects that you should report to your doctor or health care professional as soon as possible:  allergic reactions like skin rash, itching or hives, swelling of the face, lips, or tongue  bone pain  breathing problems  dizziness  jaw pain, especially after dental work  redness, blistering, peeling of the skin  signs and symptoms of infection like fever or chills; cough; sore throat; pain or trouble passing urine  signs of low calcium like fast heartbeat, muscle cramps or muscle pain; pain, tingling, numbness in the hands or feet; seizures  unusual bleeding or bruising  unusually weak or tired Side effects that usually do not require medical attention (report to your doctor or health care professional if they continue or are bothersome):  constipation  diarrhea  headache  joint pain  loss of appetite  muscle pain  runny nose  tiredness  upset stomach This list may not describe all possible side effects. Call your doctor for medical advice about side effects. You may report side effects to FDA at 1-800-FDA-1088. Where should I keep my medicine? This medicine is only given in a clinic, doctor's office, or other health care setting and will not be stored at home. NOTE: This sheet is a summary. It may not cover all possible information. If you have questions about this medicine, talk to your doctor, pharmacist, or health care provider.  2020 Elsevier/Gold Standard (2017-09-10  16:10:44)  Osteoporosis  Osteoporosis is thinning and loss of density in your bones. Osteoporosis makes bones more brittle and fragile and more likely to break (fracture). Over time, osteoporosis can cause your bones to become so weak that they fracture after a minor fall. Bones in the hip, wrist, and spine are most likely to fracture due to osteoporosis. What are the causes? The exact cause of this condition is not known. What increases the risk? You may be at greater risk for osteoporosis if you:  Have a family history of the condition.  Have poor nutrition.  Use steroid medicines, such as prednisone.  Are female.  Are age 60 or older.  Smoke or have a history of smoking.  Are not physically active (are sedentary).  Are white (Caucasian) or of Asian descent.  Have a small body frame.  Take certain medicines, such as antiseizure medicines. What are the signs or symptoms? A fracture might be the first sign of osteoporosis, especially if the fracture results from a fall or injury that usually would not cause a bone to break. Other signs and symptoms include:  Pain in the neck or low back.  Stooped posture.  Loss of height. How is this diagnosed? This condition may  be diagnosed based on:  Your medical history.  A physical exam.  A bone mineral density test, also called a DXA or DEXA test (dual-energy X-ray absorptiometry test). This test uses X-rays to measure the amount of minerals in your bones. How is this treated? The goal of treatment is to strengthen your bones and lower your risk for a fracture. Treatment may involve:  Making lifestyle changes, such as: ? Including foods with more calcium and vitamin D in your diet. ? Doing weight-bearing and muscle-strengthening exercises. ? Stopping tobacco use. ? Limiting alcohol intake.  Taking medicine to slow the process of bone loss or to increase bone density.  Taking daily supplements of calcium and vitamin  D.  Taking hormone replacement medicines, such as estrogen for women and testosterone for men.  Monitoring your levels of calcium and vitamin D. Follow these instructions at home:  Activity  Exercise as told by your health care provider. Ask your health care provider what exercises and activities are safe for you. You should do: ? Exercises that make you work against gravity (weight-bearing exercises), such as tai chi, yoga, or walking. ? Exercises to strengthen muscles, such as lifting weights. Lifestyle  Limit alcohol intake to no more than 1 drink a day for nonpregnant women and 2 drinks a day for men. One drink equals 12 oz of beer, 5 oz of wine, or 1 oz of hard liquor.  Do not use any products that contain nicotine or tobacco, such as cigarettes and e-cigarettes. If you need help quitting, ask your health care provider. Preventing falls  Use devices to help you move around (mobility aids) as needed, such as canes, walkers, scooters, or crutches.  Keep rooms well-lit and clutter-free.  Remove tripping hazards from walkways, including cords and throw rugs.  Install grab bars in bathrooms and safety rails on stairs.  Use rubber mats in the bathroom and other areas that are often wet or slippery.  Wear closed-toe shoes that fit well and support your feet. Wear shoes that have rubber soles or low heels.  Review your medicines with your health care provider. Some medicines can cause dizziness or changes in blood pressure, which can increase your risk of falling. General instructions  Include calcium and vitamin D in your diet. Calcium is important for bone health, and vitamin D helps your body to absorb calcium. Good sources of calcium and vitamin D include: ? Certain fatty fish, such as salmon and tuna. ? Products that have calcium and vitamin D added to them (fortified products), such as fortified cereals. ? Egg yolks. ? Cheese. ? Liver.  Take over-the-counter and  prescription medicines only as told by your health care provider.  Keep all follow-up visits as told by your health care provider. This is important. Contact a health care provider if:  You have never been screened for osteoporosis and you are: ? A woman who is age 73 or older. ? A man who is age 29 or older. Get help right away if:  You fall or injure yourself. Summary  Osteoporosis is thinning and loss of density in your bones. This makes bones more brittle and fragile and more likely to break (fracture),even with minor falls.  The goal of treatment is to strengthen your bones and reduce your risk for a fracture.  Include calcium and vitamin D in your diet. Calcium is important for bone health, and vitamin D helps your body to absorb calcium.  Talk with your health care provider about  screening for osteoporosis if you are a woman who is age 80 or older, or a man who is age 73 or older. This information is not intended to replace advice given to you by your health care provider. Make sure you discuss any questions you have with your health care provider. Document Revised: 04/16/2017 Document Reviewed: 02/26/2017 Elsevier Patient Education  2020 [employer].

## 2020-04-17 NOTE — Progress Notes (Signed)
Chief Complaint  Patient presents with  . Follow-up   Annual  1. htn controlled on toprol xl 25 mg qd and norvasc 2.5 mg bid home readings 121-138/60s-70s HR 59-75 since 12/23/19  Reviewed labs HLD improved cut out red meat and reduced chicken does mostly fish fiber, grains fruits and vegs 2.osteoporosis noted bone density 01/2019 declines prolia for now will repeat mammo (pt pref.) and bone density 01/2021    Review of Systems  Constitutional: Negative for weight loss.  HENT: Negative for hearing loss.   Eyes: Negative for blurred vision.  Respiratory: Negative for shortness of breath.   Cardiovascular: Negative for chest pain.  Gastrointestinal: Negative for abdominal pain.  Musculoskeletal: Negative for falls.  Skin: Negative for rash.  Neurological: Negative for headaches.  Psychiatric/Behavioral: Negative for depression.   Past Medical History:  Diagnosis Date  . Allergy   . Hyperlipidemia   . Hypertension   . Scarlet fever    as child caused vision issues has to wear glasses now   Past Surgical History:  Procedure Laterality Date  . APPENDECTOMY     age [age]  . CARPAL TUNNEL RELEASE     left 2008  . OTHER SURGICAL HISTORY     left great toe fusion 2012    Family History  Problem Relation Age of Onset  . Other Mother        brain tumor died 84; heavy smoker   . Heart attack Father        56s  . Breast cancer Neg Hx    Social History   Socioeconomic History  . Marital status: Married    Spouse name: Not on file  . Number of children: Not on file  . Years of education: Not on file  . Highest education level: Not on file  Occupational History  . Not on file  Tobacco Use  . Smoking status: Never Smoker  . Smokeless tobacco: Never Used  Vaping Use  . Vaping Use: Never used  Substance and Sexual Activity  . Alcohol use: No  . Drug use: No  . Sexual activity: Never    Partners: Male  Other Topics Concern  . 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 Difficulty of Paying Living Expenses: Not hard at all  Food Insecurity: No Food Insecurity  . Worried About Charity fundraiser in the Last Year: Never true  . Ran Out of Food in the Last Year: Never true  Transportation Needs: No Transportation Needs  . Lack of Transportation (Medical): No  . Lack of Transportation (Non-Medical): No  Physical Activity: Sufficiently Active  . Days of Exercise per Week: 5 days  . Minutes of Exercise per Session: 30 min  Stress: No Stress Concern Present  . Feeling of Stress : Not at all  Social Connections: Unknown  . Frequency of Communication with Friends and Family: Not on file  . Frequency of Social Gatherings with Friends and Family: Not on file  . Attends Religious Services: Not on file  . Active Member of Clubs or Organizations: Not on file  . Attends Archivist Meetings: Not on file  .  Marital Status: Married  Human resources officer Violence: Not At Risk  . Fear of Current or Ex-Partner: No  . Emotionally Abused: No  . Physically Abused: No  . Sexually Abused: No   Current Meds  Medication Sig  . amLODipine (NORVASC) 2.5 MG tablet Take 1 tablet (2.5 mg total) by mouth 2 (two) times daily.  Marland Kitchen aspirin EC 81 MG tablet Take 81 mg by mouth every other day.  . metoprolol succinate (TOPROL-XL) 25 MG 24 hr tablet Take 1 tablet (25 mg total) by mouth daily.  . Multiple Vitamin (MULTIVITAMIN) tablet Take 1 tablet by mouth daily.  . [DISCONTINUED] amLODipine (NORVASC) 2.5 MG tablet Take 1 tablet (2.5 mg total) by mouth 2 (two) times daily.  . [DISCONTINUED] metoprolol succinate (TOPROL-XL) 25 MG 24 hr tablet Take 1 tablet (25 mg total) by mouth daily.   Allergies  Allergen  Reactions  . Sulfa Antibiotics Itching    Throat tight  . Telmisartan     Heart skipping beats, flank pain and back pain once stopped resolved and nightmares per pt    . Zetia [Ezetimibe]     Back,kidney pain, muscle aches thighs  . Penicillins Rash    Did it involve swelling of the face/tongue/throat, SOB, or low BP? No Did it involve sudden or severe rash/hives, skin peeling, or any reaction on the inside of your mouth or nose? No Did you need to seek medical attention at a hospital or doctor's office? No When did it last happen?childhood If all above answers are "NO", may proceed with cephalosporin use.   Recent Results (from the past 2160 hour(s))  Lipid panel     Status: Abnormal   Collection Time: 04/15/20  8:22 AM  Result Value Ref Range   Cholesterol 199 0 - 200 mg/dL    Comment: ATP III Classification       Desirable:  < 200 mg/dL               Borderline High:  200 - 239 mg/dL          High:  > = 240 mg/dL   Triglycerides 127.0 0 - 149 mg/dL    Comment: Normal:  <150 mg/dLBorderline High:  150 - 199 mg/dL   HDL 55.10 >39.00 mg/dL   VLDL 25.4 0.0 - 40.0 mg/dL   LDL Cholesterol 119 (H) 0 - 99 mg/dL   Total CHOL/HDL Ratio 4     Comment:                Men          Women1/2 Average Risk     3.4          3.3Average Risk          5.0          4.42X Average Risk          9.6          7.13X Average Risk          15.0          11.0                       NonHDL 144.28     Comment: NOTE:  Non-HDL goal should be 30 mg/dL higher than patient's LDL goal (i.e. LDL goal of < 70 mg/dL, would have non-HDL goal of < 100 mg/dL)  Urinalysis, Routine w reflex microscopic     Status: Abnormal   Collection Time:  04/15/20  8:22 AM  Result Value Ref Range   Color, Urine YELLOW YELLOW   APPearance CLEAR CLEAR   Specific Gravity, Urine 1.002 1.001 - 1.03   pH 6.5 5.0 - 8.0   Glucose, UA NEGATIVE NEGATIVE   Bilirubin Urine NEGATIVE NEGATIVE   Ketones, ur NEGATIVE NEGATIVE   Hgb urine  dipstick NEGATIVE NEGATIVE   Protein, ur NEGATIVE NEGATIVE   Nitrite NEGATIVE NEGATIVE   Leukocytes,Ua TRACE (A) NEGATIVE   WBC, UA NONE SEEN 0 - 5 /HPF   RBC / HPF NONE SEEN 0 - 2 /HPF   Squamous Epithelial / LPF NONE SEEN < OR = 5 /HPF   Bacteria, UA NONE SEEN NONE SEEN /HPF   Hyaline Cast NONE SEEN NONE SEEN /LPF  CBC with Differential/Platelet     Status: None   Collection Time: 04/15/20  8:22 AM  Result Value Ref Range   WBC 6.0 4.0 - 10.5 K/uL   RBC 4.87 3.87 - 5.11 Mil/uL   Hemoglobin 14.6 12.0 - 15.0 g/dL   HCT 41.4 36 - 46 %   MCV 85.1 78.0 - 100.0 fl   MCHC 35.3 30.0 - 36.0 g/dL   RDW 12.9 11.5 - 15.5 %   Platelets 257.0 150 - 400 K/uL   Neutrophils Relative % 58.8 43 - 77 %   Lymphocytes Relative 28.5 12 - 46 %   Monocytes Relative 10.7 3 - 12 %   Eosinophils Relative 1.5 0 - 5 %   Basophils Relative 0.5 0 - 3 %   Neutro Abs 3.5 1.4 - 7.7 K/uL   Lymphs Abs 1.7 0.7 - 4.0 K/uL   Monocytes Absolute 0.6 0.1 - 1.0 K/uL   Eosinophils Absolute 0.1 0.0 - 0.7 K/uL   Basophils Absolute 0.0 0.0 - 0.1 K/uL  Comprehensive metabolic panel     Status: None   Collection Time: 04/15/20  8:22 AM  Result Value Ref Range   Sodium 138 135 - 145 mEq/L   Potassium 4.4 3.5 - 5.1 mEq/L   Chloride 101 96 - 112 mEq/L   CO2 30 19 - 32 mEq/L   Glucose, Bld 90 70 - 99 mg/dL   BUN 16 6 - 23 mg/dL   Creatinine, Ser 0.70 0.40 - 1.20 mg/dL   Total Bilirubin 0.7 0.2 - 1.2 mg/dL   Alkaline Phosphatase 68 39 - 117 U/L   AST 19 0 - 37 U/L   ALT 15 0 - 35 U/L   Total Protein 7.3 6.0 - 8.3 g/dL   Albumin 4.5 3.5 - 5.2 g/dL   GFR 87.27 >60.00 mL/min    Comment: Calculated using the CKD-EPI Creatinine Equation (2021)   Calcium 9.5 8.4 - 10.5 mg/dL   Objective  Body mass index is 27.87 kg/m. Wt Readings from Last 3 Encounters:  04/17/20 138 lb (62.6 kg)  12/04/19 130 lb (59 kg)  11/17/19 130 lb (59 kg)   Temp Readings from Last 3 Encounters:  04/17/20 97.8 F (36.6 C) (Oral)  08/31/19  (!) 97.5 F (36.4 C) (Temporal)  03/01/19 97.9 F (36.6 C) (Temporal)   BP Readings from Last 3 Encounters:  04/17/20 124/80  08/31/19 130/78  04/27/19 130/79   Pulse Readings from Last 3 Encounters:  04/17/20 (!) 45  08/31/19 67  04/27/19 72    Physical Exam Vitals and nursing note reviewed.  Constitutional:      Appearance: Normal appearance. She is well-developed and well-groomed.  HENT:     Head: Normocephalic and  atraumatic.  Eyes:     Conjunctiva/sclera: Conjunctivae normal.     Pupils: Pupils are equal, round, and reactive to light.  Cardiovascular:     Rate and Rhythm: Normal rate and regular rhythm.     Heart sounds: Normal heart sounds. No murmur heard.   Pulmonary:     Effort: Pulmonary effort is normal.     Breath sounds: Normal breath sounds.  Skin:    General: Skin is warm and dry.  Neurological:     General: No focal deficit present.     Mental Status: She is alert and oriented to person, place, and time. Mental status is at baseline.     Gait: Gait normal.  Psychiatric:        Attention and Perception: Attention and perception normal.        Mood and Affect: Mood and affect normal.        Speech: Speech normal.        Behavior: Behavior normal. Behavior is cooperative.        Thought Content: Thought content normal.        Cognition and Memory: Cognition and memory normal.        Judgment: Judgment normal.     Assessment  Plan  Annual physical exam Flu wait 2021  -called [facility] [place] today 10/18/2018 their system only goes back 18 months and pna 23 not in systemconsider in future  -04/17/20 declines   prevnar had 05/17/14 brought proof of vx Tdap per pt had 2013  zostervax had 2015has not hadshingrix and declines another shingles vaccine had rash with 1st moderna 3/3  Last pap 11/28/15 Dr. Barbera Setters neg pap +atrophy HPV neg  Last colonoscopy 2015 nl per pt need to get records per pt they rec in 10 years no h/o polyps she has had  colonoscopy x 2 -Dr. Fayrene Fearing [facility] prior -think about wants to due spring 2022 call back for referral as of 04/17/20    My eye doctor seen 08/2019  DEXA9/8/20 -2.9 osteoporosis hold prolia declines for now Repeat DEXA 01/23/21   mammo9/8/20 negative pt wants to wait 01/23/21  Disc hep B/C/MMRtesting pt declines for now  No skin issues  rec healthy diet and exercise   Osteoporosis, unspecified osteoporosis type, unspecified pathological fracture presence - Plan: DG Bone Density Declines prolia   Essential hypertension controlled - Plan: metoprolol succinate (TOPROL-XL) 25 MG 24 hr tablet, amLODipine (NORVASC) 2.5 MG tablet bid  Hyperlipidemia, unspecified hyperlipidemia type  Improved with change in diet and exercise   Provider: Dr. Olivia Mackie [name]

## 2020-05-15 DIAGNOSIS — G4733 Obstructive sleep apnea (adult) (pediatric): Secondary | ICD-10-CM | POA: Diagnosis not present

## 2020-05-30 ENCOUNTER — Encounter: Payer: Self-pay | Admitting: Internal Medicine

## 2020-06-25 ENCOUNTER — Telehealth: Payer: Self-pay | Admitting: Primary Care

## 2020-06-25 NOTE — Telephone Encounter (Signed)
Lm requesting that patient bring SD card to ov on 06/26/2020.

## 2020-06-26 ENCOUNTER — Encounter: Payer: Self-pay | Admitting: Primary Care

## 2020-06-26 ENCOUNTER — Other Ambulatory Visit: Payer: Self-pay

## 2020-06-26 ENCOUNTER — Ambulatory Visit (INDEPENDENT_AMBULATORY_CARE_PROVIDER_SITE_OTHER): Payer: Medicare HMO | Admitting: Primary Care

## 2020-06-26 VITALS — BP 120/74 | HR 78 | Temp 97.5°F | Ht 59.0 in | Wt 140.0 lb

## 2020-06-26 DIAGNOSIS — G4733 Obstructive sleep apnea (adult) (pediatric): Secondary | ICD-10-CM | POA: Diagnosis not present

## 2020-06-26 DIAGNOSIS — I499 Cardiac arrhythmia, unspecified: Secondary | ICD-10-CM | POA: Diagnosis not present

## 2020-06-26 NOTE — Patient Instructions (Addendum)
Nice seeing you today Erica Valencia you try and wear CPAP every night for minimum of 4 hours or more  Orders: ECG today  Mask fitting with DME  Change pressure 5-12cm h20    Follow-up 6 months with Dr. Belia Heman or APP (televisit ok)   CPAP and BPAP Information CPAP and BPAP are methods that use air pressure to keep your airways open and to help you breathe well. CPAP and BPAP use different amounts of pressure. Your health care provider will tell you whether CPAP or BPAP would be more helpful for you.  CPAP stands for "continuous positive airway pressure." With CPAP, the amount of pressure stays the same while you breathe in and out.  BPAP stands for "bi-level positive airway pressure." With BPAP, the amount of pressure will be higher when you breathe in (inhale) and lower when you breathe out(exhale). This allows you to take larger breaths. CPAP or BPAP may be used in the hospital, or your health care provider may want you to use it at home. You may need to have a sleep study before your health care provider can order a machine for you to use at home. Why are CPAP and BPAP treatments used? CPAP or BPAP can be helpful if you have:  Sleep apnea.  Chronic obstructive pulmonary disease (COPD).  Heart failure.  Medical conditions that cause muscle weakness, including muscular dystrophy or amyotrophic lateral sclerosis (ALS).  Other problems that cause breathing to be shallow, weak, abnormal, or difficult. CPAP and BPAP are most commonly used for obstructive sleep apnea (OSA) to keep the airways from collapsing when the muscles relax during sleep. How is CPAP or BPAP administered? Both CPAP and BPAP are provided by a small machine with a flexible plastic tube that attaches to a plastic mask that you wear. Air is blown through the mask into your nose or mouth. The amount of pressure that is used to blow the air can be adjusted on the machine. Your health care provider will set the  pressure setting and help you find the best mask for you. When should CPAP or BPAP be used? In most cases, the mask only needs to be worn during sleep. Generally, the mask needs to be worn throughout the night and during any daytime naps. People with certain medical conditions may also need to wear the mask at other times when they are awake. Follow instructions from your health care provider about when to use the machine. What are some tips for using the mask?  Because the mask needs to be snug, some people feel trapped or closed-in (claustrophobic) when first using the mask. If you feel this way, you may need to get used to the mask. One way to do this is to hold the mask loosely over your nose or mouth and then gradually apply the mask more snugly. You can also gradually increase the amount of time that you use the mask.  Masks are available in various types and sizes. If your mask does not fit well, talk with your health care provider about getting a different one. Some common types of masks include: ? Full face masks, which fit over the mouth and nose. ? Nasal masks, which fit over the nose. ? Nasal pillow or prong masks, which fit into the nostrils.  If you are using a mask that fits over your nose and you tend to breathe through your mouth, a chin strap may be applied to help keep your mouth  closed.  Some CPAP and BPAP machines have alarms that may sound if the mask comes off or develops a leak.  If you have trouble with the mask, it is very important that you talk with your health care provider about finding a way to make the mask easier to tolerate. Do not stop using the mask. There could be a negative impact to your health if you stop using the mask.   What are some tips for using the machine?  Place your CPAP or BPAP machine on a secure table or stand near an electrical outlet.  Know where the on/off switch is on the machine.  Follow instructions from your health care provider about  how to set the pressure on your machine and when you should use it.  Do not eat or drink while the CPAP or BPAP machine is on. Food or fluids could get pushed into your lungs by the pressure of the CPAP or BPAP.  For home use, CPAP and BPAP machines can be rented or purchased through home health care companies. Many different brands of machines are available. Renting a machine before purchasing may help you find out which particular machine works well for you. Your insurance may also decide which machine you may get.  Keep the CPAP or BPAP machine and attachments clean. Ask your health care provider for specific instructions. Follow these instructions at home:  Do not use any products that contain nicotine or tobacco, such as cigarettes, e-cigarettes, and chewing tobacco. If you need help quitting, ask your health care provider.  Keep all follow-up visits as told by your health care provider. This is important. Contact a health care provider if:  You have redness or pressure sores on your head, face, mouth, or nose from the mask or head gear.  You have trouble using the CPAP or BPAP machine.  You cannot tolerate wearing the CPAP or BPAP mask.  Someone tells you that you snore even when wearing your CPAP or BPAP. Get help right away if:  You have trouble breathing.  You feel confused. Summary  CPAP and BPAP are methods that use air pressure to keep your airways open and to help you breathe well.  You may need to have a sleep study before your health care provider can order a machine for home use.  If you have trouble with the mask, it is very important that you talk with your health care provider about finding a way to make the mask easier to tolerate. Do not stop using the mask. There could be a negative impact to your health if you stop using the mask.  Follow instructions from your health care provider about when to use the machine. This information is not intended to replace  advice given to you by your health care provider. Make sure you discuss any questions you have with your health care provider. Document Revised: 05/26/2019 Document Reviewed: 05/29/2019 Elsevier Patient Education  2021 [employer].

## 2020-06-26 NOTE — Progress Notes (Signed)
@Patient  ID: , female    DOB: [birth year], 70 y.o.   MRN: [id number]  Chief Complaint  Patient presents with  . Follow-up    Wearing cpap 4-5hr nightly. Mask pinches her cheeks.     Referring provider: [name], [id number] *  HPI: 70 year old female, never smoked.  Past medical history significant for obstructive sleep apnea.  Patient of Dr. 62, last seen on 03/01/19. HST 11/13/18, AHI 6.0.   06/26/2020 Patient presents today for annual follow-up OSA. She has been having some issues with CPAP mask fitting properly. Headgear is too big and mask pinches her cheeks. States that if her mask fit better she would have no trouble wearing her CPAP. She owns her machine. She changes her supplies every 3 months. Denies f/c/s, chest tightness/pain, shortness of breath, wheezing, cough, N/V/D.  Airview download 05/25/20-06/23/20 20/30 days used; 12 days (40%) > 4 hours Average usage days used 4 hours 47 mins Pressure 5-20cm h20 (9.2cm h20- 95%) Airleaks 1.7L/min AHI 0/2  Allergies  Allergen Reactions  . Sulfa Antibiotics Itching    Throat tight  . Telmisartan     Heart skipping beats, flank pain and back pain once stopped resolved and nightmares per pt    . Zetia [Ezetimibe]     Back,kidney pain, muscle aches thighs  . Penicillins Rash    Did it involve swelling of the face/tongue/throat, SOB, or low BP? No Did it involve sudden or severe rash/hives, skin peeling, or any reaction on the inside of your mouth or nose? No Did you need to seek medical attention at a hospital or doctor's office? No When did it last happen?childhood If all above answers are "NO", may proceed with cephalosporin use.    Immunization History  Administered Date(s) Administered  . Fluad Quad(high Dose 65+) 01/25/2019  . Influenza, High Dose Seasonal PF 05/22/2017, 03/16/2018  . Influenza-Unspecified 05/25/2017, 03/16/2018  . Moderna Sars-Covid-2 Vaccination 01/03/2020, 01/31/2020    Past  Medical History:  Diagnosis Date  . Allergy   . Hyperlipidemia   . Hypertension   . Scarlet fever    as child caused vision issues has to wear glasses now    Tobacco History: Social History   Tobacco Use  Smoking Status Never Smoker  Smokeless Tobacco Never Used   Counseling given: Not Answered   Outpatient Medications Prior to Visit  Medication Sig Dispense Refill  . albuterol (VENTOLIN HFA) 108 (90 Base) MCG/ACT inhaler Inhale 1-2 puffs into the lungs every 6 (six) hours as needed for wheezing or shortness of breath. 18 g 2  . amLODipine (NORVASC) 2.5 MG tablet Take 1 tablet (2.5 mg total) by mouth 2 (two) times daily. 180 tablet 3  . aspirin EC 81 MG tablet Take 81 mg by mouth every other day.    . metoprolol succinate (TOPROL-XL) 25 MG 24 hr tablet Take 1 tablet (25 mg total) by mouth daily. 90 tablet 3  . Multiple Vitamin (MULTIVITAMIN) tablet Take 1 tablet by mouth daily.     No facility-administered medications prior to visit.    Review of Systems  Review of Systems  Constitutional: Negative.   Respiratory: Negative.   Cardiovascular: Negative.   Psychiatric/Behavioral: Negative.    Physical Exam  BP 120/74 (BP Location: Left Arm, Cuff Size: Normal)   Pulse 78   Temp (!) 97.5 F (36.4 C) (Temporal)   Ht 4\' 11"  (1.499 m)   Wt 140 lb (63.5 kg)   SpO2 99%   BMI 28.28 kg/m  Physical Exam Constitutional:      Appearance: Normal appearance.  HENT:     Mouth/Throat:     Comments: Deferred d/t masking Cardiovascular:     Rate and Rhythm: Normal rate. Rhythm irregular.     Comments: ECG confirmed NSR Pulmonary:     Effort: Pulmonary effort is normal.     Breath sounds: Normal breath sounds.  Musculoskeletal:        General: Normal range of motion.  Skin:    General: Skin is warm and dry.  Neurological:     General: No focal deficit present.     Mental Status: She is alert and oriented to person, place, and time. Mental status is at baseline.   Psychiatric:        Mood and Affect: Mood normal.        Behavior: Behavior normal.        Thought Content: Thought content normal.        Judgment: Judgment normal.      Lab Results:  CBC    Component Value Date/Time   WBC 6.0 04/15/2020 0822   RBC 4.87 04/15/2020 0822   HGB 14.6 04/15/2020 0822   HCT 41.4 04/15/2020 0822   PLT 257.0 04/15/2020 0822   MCV 85.1 04/15/2020 0822   MCH 29.5 09/12/2018 1003   MCHC 35.3 04/15/2020 0822   RDW 12.9 04/15/2020 0822   LYMPHSABS 1.7 04/15/2020 0822   MONOABS 0.6 04/15/2020 0822   EOSABS 0.1 04/15/2020 0822   BASOSABS 0.0 04/15/2020 0822    BMET    Component Value Date/Time   NA 138 04/15/2020 0822   K 4.4 04/15/2020 0822   CL 101 04/15/2020 0822   CO2 30 04/15/2020 0822   GLUCOSE 90 04/15/2020 0822   BUN 16 04/15/2020 0822   CREATININE 0.70 04/15/2020 0822   CALCIUM 9.5 04/15/2020 0822   GFRNONAA >60 09/12/2018 1003   GFRAA >60 09/12/2018 1003    BNP No results found for: BNP  ProBNP No results found for: PROBNP  Imaging: No results found.   Assessment & Plan:   OSA (obstructive sleep apnea) - Mild OSA on auto CPAP. She is moderately compliant with CPAP use, having issues with mask fitting properly. Pressure 5-20cm h20, residual AHI 0.2. Minimal airleaks. Recommend mask fitting and adjust pressure to 5-12cm h20. FU in 6 months.      Glenford Bayley, NP 06/26/2020

## 2020-06-26 NOTE — Assessment & Plan Note (Signed)
-   Mild OSA on auto CPAP. She is moderately compliant with CPAP use, having issues with mask fitting properly. Pressure 5-20cm h20, residual AHI 0.2. Minimal airleaks. Recommend mask fitting and adjust pressure to 5-12cm h20. FU in 6 months.

## 2020-07-10 DIAGNOSIS — G4733 Obstructive sleep apnea (adult) (pediatric): Secondary | ICD-10-CM | POA: Diagnosis not present

## 2020-07-26 ENCOUNTER — Other Ambulatory Visit: Payer: Self-pay | Admitting: Internal Medicine

## 2020-07-26 DIAGNOSIS — I10 Essential (primary) hypertension: Secondary | ICD-10-CM

## 2020-08-06 DIAGNOSIS — H5213 Myopia, bilateral: Secondary | ICD-10-CM | POA: Diagnosis not present

## 2020-08-13 DIAGNOSIS — Z0100 Encounter for examination of eyes and vision without abnormal findings: Secondary | ICD-10-CM | POA: Diagnosis not present

## 2020-09-30 ENCOUNTER — Emergency Department
Admission: EM | Admit: 2020-09-30 | Discharge: 2020-09-30 | Disposition: A | Discharge status: Home / Self Care | Payer: Medicare HMO | Attending: Emergency Medicine | Admitting: Emergency Medicine

## 2020-09-30 ENCOUNTER — Other Ambulatory Visit: Payer: Self-pay

## 2020-09-30 ENCOUNTER — Emergency Department: Payer: Medicare HMO

## 2020-09-30 ENCOUNTER — Telehealth: Payer: Self-pay | Admitting: Cardiovascular Disease

## 2020-09-30 ENCOUNTER — Other Ambulatory Visit: Payer: Self-pay | Admitting: Nurse Practitioner

## 2020-09-30 ENCOUNTER — Encounter: Payer: Self-pay | Admitting: Emergency Medicine

## 2020-09-30 ENCOUNTER — Emergency Department
Admit: 2020-09-30 | Discharge: 2020-09-30 | Disposition: A | Discharge status: Home / Self Care | Payer: Medicare HMO | Attending: Nurse Practitioner | Admitting: Nurse Practitioner

## 2020-09-30 DIAGNOSIS — R001 Bradycardia, unspecified: Secondary | ICD-10-CM | POA: Insufficient documentation

## 2020-09-30 DIAGNOSIS — R55 Syncope and collapse: Secondary | ICD-10-CM

## 2020-09-30 DIAGNOSIS — Z23 Encounter for immunization: Secondary | ICD-10-CM | POA: Diagnosis not present

## 2020-09-30 DIAGNOSIS — U071 COVID-19: Secondary | ICD-10-CM | POA: Insufficient documentation

## 2020-09-30 DIAGNOSIS — I10 Essential (primary) hypertension: Secondary | ICD-10-CM | POA: Diagnosis not present

## 2020-09-30 DIAGNOSIS — Y92002 Bathroom of unspecified non-institutional (private) residence single-family (private) house as the place of occurrence of the external cause: Secondary | ICD-10-CM | POA: Insufficient documentation

## 2020-09-30 DIAGNOSIS — S0990XA Unspecified injury of head, initial encounter: Secondary | ICD-10-CM | POA: Diagnosis not present

## 2020-09-30 DIAGNOSIS — Z7982 Long term (current) use of aspirin: Secondary | ICD-10-CM | POA: Insufficient documentation

## 2020-09-30 DIAGNOSIS — S0101XA Laceration without foreign body of scalp, initial encounter: Secondary | ICD-10-CM

## 2020-09-30 DIAGNOSIS — Z79899 Other long term (current) drug therapy: Secondary | ICD-10-CM | POA: Diagnosis not present

## 2020-09-30 DIAGNOSIS — Y9301 Activity, walking, marching and hiking: Secondary | ICD-10-CM | POA: Diagnosis not present

## 2020-09-30 DIAGNOSIS — W01198A Fall on same level from slipping, tripping and stumbling with subsequent striking against other object, initial encounter: Secondary | ICD-10-CM | POA: Insufficient documentation

## 2020-09-30 HISTORY — DX: COVID-19: U07.1

## 2020-09-30 LAB — CBC WITH DIFFERENTIAL/PLATELET
Abs Immature Granulocytes: 0.04 10*3/uL (ref 0.00–0.07)
Basophils Absolute: 0 10*3/uL (ref 0.0–0.1)
Basophils Relative: 0 %
Eosinophils Absolute: 0 10*3/uL (ref 0.0–0.5)
Eosinophils Relative: 0 %
HCT: 39.6 % (ref 36.0–46.0)
Hemoglobin: 14.2 g/dL (ref 12.0–15.0)
Immature Granulocytes: 0 %
Lymphocytes Relative: 11 %
Lymphs Abs: 1.3 10*3/uL (ref 0.7–4.0)
MCH: 30.1 pg (ref 26.0–34.0)
MCHC: 35.9 g/dL (ref 30.0–36.0)
MCV: 83.9 fL (ref 80.0–100.0)
Monocytes Absolute: 1.7 10*3/uL — ABNORMAL HIGH (ref 0.1–1.0)
Monocytes Relative: 15 %
Neutro Abs: 8.6 10*3/uL — ABNORMAL HIGH (ref 1.7–7.7)
Neutrophils Relative %: 74 %
Platelets: 247 10*3/uL (ref 150–400)
RBC: 4.72 MIL/uL (ref 3.87–5.11)
RDW: 12 % (ref 11.5–15.5)
WBC: 11.8 10*3/uL — ABNORMAL HIGH (ref 4.0–10.5)
nRBC: 0 % (ref 0.0–0.2)

## 2020-09-30 LAB — RESP PANEL BY RT-PCR (FLU A&B, COVID) ARPGX2
Influenza A by PCR: NEGATIVE
Influenza B by PCR: NEGATIVE
SARS Coronavirus 2 by RT PCR: POSITIVE — AB

## 2020-09-30 LAB — COMPREHENSIVE METABOLIC PANEL
ALT: 21 U/L (ref 0–44)
AST: 28 U/L (ref 15–41)
Albumin: 4.3 g/dL (ref 3.5–5.0)
Alkaline Phosphatase: 70 U/L (ref 38–126)
Anion gap: 9 (ref 5–15)
BUN: 10 mg/dL (ref 8–23)
CO2: 23 mmol/L (ref 22–32)
Calcium: 9.1 mg/dL (ref 8.9–10.3)
Chloride: 107 mmol/L (ref 98–111)
Creatinine, Ser: 0.67 mg/dL (ref 0.44–1.00)
GFR, Estimated: >60 mL/min (ref >60)
Glucose, Bld: 136 mg/dL — ABNORMAL HIGH (ref 70–99)
Potassium: 3.7 mmol/L (ref 3.5–5.1)
Sodium: 139 mmol/L (ref 135–145)
Total Bilirubin: 1 mg/dL (ref 0.3–1.2)
Total Protein: 7.1 g/dL (ref 6.5–8.1)

## 2020-09-30 LAB — CBG MONITORING, ED: Glucose-Capillary: 135 mg/dL — ABNORMAL HIGH (ref 70–99)

## 2020-09-30 LAB — TROPONIN I (HIGH SENSITIVITY)
Troponin I (High Sensitivity): 3 ng/L (ref <18)
Troponin I (High Sensitivity): <2 ng/L (ref <18)

## 2020-09-30 MED ORDER — PAXLOVID 20 X 150 MG & 10 X 100MG PO TBPK: 3.0000 | ORAL_TABLET | Freq: Two times a day (BID) | ORAL | 0 refills | Status: AC

## 2020-09-30 MED ORDER — ONDANSETRON 4 MG PO TBDP: 4.0000 mg | ORAL_TABLET | Freq: Three times a day (TID) | ORAL | 0 refills | Status: DC | PRN

## 2020-09-30 MED ORDER — LIDOCAINE-EPINEPHRINE 2 %-1:100000 IJ SOLN
20.0000 mL | Freq: Once | INTRAMUSCULAR | Status: AC
  Administered 2020-09-30: 20 mL
  Filled 2020-09-30: qty 1

## 2020-09-30 MED ORDER — TETANUS-DIPHTH-ACELL PERTUSSIS 5-2.5-18.5 LF-MCG/0.5 IM SUSY
0.5000 mL | PREFILLED_SYRINGE | Freq: Once | INTRAMUSCULAR | Status: AC
  Administered 2020-09-30: 0.5 mL via INTRAMUSCULAR
  Filled 2020-09-30: qty 0.5

## 2020-09-30 NOTE — Telephone Encounter (Signed)
Patient currently admitted to ED

## 2020-09-30 NOTE — ED Provider Notes (Signed)
[facility] Emergency Department Provider Note  ____________________________________________  Time seen: Approximately 11:57 AM  I have reviewed the triage vital signs and the nursing notes.   HISTORY  Chief Complaint Loss of Consciousness    HPI Erica Valencia is a 70 y.o. female with a history of hypertension hyperlipidemia who reports being in her usual state of health, and after getting up she was walking to the bathroom but when she lost consciousness and fell against the door frame hitting her head.  Denies any preceding symptoms including dizziness chest pain shortness of breath back pain abdominal pain.  No palpitations.  Reports that she does not normally have passing out episodes.  No aggravating or alleviating factors, currently feeling normal other than some residual right-sided headache that occurred after falling and hitting her head..      Past Medical History:  Diagnosis Date  . Allergy   . Hyperlipidemia   . Hypertension   . Scarlet fever    as child caused vision issues has to wear glasses now     Patient Active Problem List   Diagnosis Date Noted  . Annual physical exam 04/17/2020  . Osteoporosis 01/25/2019  . OSA (obstructive sleep apnea) 12/09/2018  . Chest pain of uncertain etiology 09/18/2018  . Overweight (BMI 25.0-29.9) 06/10/2018  . Hyperlipidemia 12/16/2017  . Essential hypertension 05/27/2017     Past Surgical History:  Procedure Laterality Date  . APPENDECTOMY     age [age]  . CARPAL TUNNEL RELEASE     left 2008  . OTHER SURGICAL HISTORY     left great toe fusion 2012      Prior to Admission medications   Medication Sig Start Date End Date Taking? Authorizing Provider  Nirmatrelvir & Ritonavir (PAXLOVID) 20 x 150 MG & 10 x 100MG  TBPK Take 3 tablets by mouth in the morning and at bedtime for 5 days. Take according to package instruction 09/30/20 10/05/20 Yes Sharman CheekStafford, Indio Santilli, MD  ondansetron (ZOFRAN ODT) 4 MG  disintegrating tablet Take 1 tablet (4 mg total) by mouth every 8 (eight) hours as needed for nausea or vomiting. 09/30/20  Yes Sharman CheekStafford, Clary Meeker, MD  albuterol (VENTOLIN HFA) 108 (90 Base) MCG/ACT inhaler Inhale 1-2 puffs into the lungs every 6 (six) hours as needed for wheezing or shortness of breath. 12/14/19   [name], Pasty Spillersracy N, MD  amLODipine (NORVASC) 2.5 MG tablet TAKE 1 TABLET BY MOUTH TWICE A DAY 07/26/20   [name], Pasty Spillersracy N, MD  aspirin EC 81 MG tablet Take 81 mg by mouth every other day.    [provider]  metoprolol succinate (TOPROL-XL) 25 MG 24 hr tablet Take 1 tablet (25 mg total) by mouth daily. 04/17/20   [name], Pasty Spillersracy N, MD  Multiple Vitamin (MULTIVITAMIN) tablet Take 1 tablet by mouth daily.    [provider]     Allergies Sulfa antibiotics, Telmisartan, Zetia [ezetimibe], and Penicillins   Family History  Problem Relation Age of Onset  . Other Mother        brain tumor died 3653; heavy smoker   . Heart attack Father        9950s  . Breast cancer Neg Hx     Social History Social History   Tobacco Use  . Smoking status: Never Smoker  . Smokeless tobacco: Never Used  Vaping Use  . Vaping Use: Never used  Substance Use Topics  . Alcohol use: No  . Drug use: No    Review of Systems  Constitutional:   No fever or chills.  ENT:   No sore throat. No rhinorrhea. Cardiovascular:   No chest pain or syncope. Respiratory:   No dyspnea or cough. Gastrointestinal:   Negative for abdominal pain, vomiting and diarrhea.  Musculoskeletal:   Negative for focal pain or swelling All other systems reviewed and are negative except as documented above in ROS and HPI.  ____________________________________________   PHYSICAL EXAM:  VITAL SIGNS: ED Triage Vitals  Enc Vitals Group     BP 09/30/20 0611 124/66     Pulse Rate 09/30/20 0611 85     Resp 09/30/20 0611 20     Temp 09/30/20 0611 98.9 F (37.2 C)     Temp Source 09/30/20 0611  Oral     SpO2 09/30/20 0611 93 %     Weight 09/30/20 0612 135 lb (61.2 kg)     Height 09/30/20 0612 4\' 11"  (1.499 m)     Head Circumference --      Peak Flow --      Pain Score 09/30/20 0612 3     Pain Loc --      Pain Edu? --      Excl. in GC? --     Vital signs reviewed, nursing assessments reviewed.   Constitutional:   Alert and oriented. Non-toxic appearance. Eyes:   Conjunctivae are normal. EOMI. PERRL. ENT      Head:   Normocephalic with 5 cm stellate laceration on the right forehead.  Hemostatic.  No depression, no raccoon eyes.  No battle sign.      Nose:   No epistaxis.      Mouth/Throat: No intraoral injury.      Neck:   No meningismus. Full ROM.  no midline C-spine tenderness Hematological/Lymphatic/Immunilogical:   No cervical lymphadenopathy. Cardiovascular:   RRR. Symmetric bilateral radial and DP pulses.  No murmurs. Cap refill less than 2 seconds. Respiratory:   Normal respiratory effort without tachypnea/retractions. Breath sounds are clear and equal bilaterally. No wheezes/rales/rhonchi. Gastrointestinal:   Soft and nontender. Non distended. There is no CVA tenderness.  No rebound, rigidity, or guarding. Genitourinary:   deferred Musculoskeletal:   Normal range of motion in all extremities. No joint effusions.  No lower extremity tenderness.  No edema. Neurologic:   Normal speech and language.  Motor grossly intact. No acute focal neurologic deficits are appreciated.  Skin:    Skin is warm, dry and intact. No rash noted.  No petechiae, purpura, or bullae.  ____________________________________________    LABS (pertinent positives/negatives) (all labs ordered are listed, but only abnormal results are displayed) Labs Reviewed  RESP PANEL BY RT-PCR (FLU A&B, COVID) ARPGX2 - Abnormal; Notable for the following components:      Result Value   SARS Coronavirus 2 by RT PCR POSITIVE (*)    All other components within normal limits  CBC WITH DIFFERENTIAL/PLATELET -  Abnormal; Notable for the following components:   WBC 11.8 (*)    Neutro Abs 8.6 (*)    Monocytes Absolute 1.7 (*)    All other components within normal limits  COMPREHENSIVE METABOLIC PANEL - Abnormal; Notable for the following components:   Glucose, Bld 136 (*)    All other components within normal limits  CBG MONITORING, ED - Abnormal; Notable for the following components:   Glucose-Capillary 135 (*)    All other components within normal limits  URINALYSIS, COMPLETE (UACMP) WITH MICROSCOPIC  TROPONIN I (HIGH SENSITIVITY)  TROPONIN I (HIGH SENSITIVITY)   ____________________________________________  EKG  Interpreted by me Sinus rhythm rate of 71, normal axis and intervals.  Normal QRS ST segments and T waves.  No ischemic changes.  ____________________________________________    RADIOLOGY  CT HEAD WO CONTRAST  Result Date: 09/30/2020 CLINICAL DATA:  Syncope. EXAM: CT HEAD WITHOUT CONTRAST TECHNIQUE: Contiguous axial images were obtained from the base of the skull through the vertex without intravenous contrast. COMPARISON:  None. FINDINGS: Brain: No evidence of acute infarction, hemorrhage, hydrocephalus, extra-axial collection or mass lesion/mass effect. Vascular: No hyperdense vessel or unexpected calcification. Skull: Normal. Negative for fracture or focal lesion. Sinuses/Orbits: No acute finding. Other: None. IMPRESSION: Normal head CT. Electronically Signed   By: Lupita Raider M.D.   On: 09/30/2020 08:26    ____________________________________________   PROCEDURES .Marland KitchenLaceration Repair  Date/Time: 09/30/2020 11:57 AM Performed by: Sharman Cheek, MD Authorized by: Sharman Cheek, MD   Consent:    Consent obtained:  Verbal   Consent given by:  Patient   Risks discussed:  Infection, pain, retained foreign body, poor cosmetic result and poor wound healing Universal protocol:    Patient identity confirmed:  Verbally with patient and arm band Anesthesia:     Anesthesia method:  Local infiltration   Local anesthetic:  Lidocaine 1% WITH epi Laceration details:    Location:  Scalp   Scalp location:  Frontal   Length (cm):  5 Pre-procedure details:    Preparation:  Patient was prepped and draped in usual sterile fashion and imaging obtained to evaluate for foreign bodies Exploration:    Hemostasis achieved with:  Direct pressure   Imaging outcome: foreign body not noted     Wound exploration: wound explored through full range of motion and entire depth of wound visualized     Wound extent: no muscle damage noted, no nerve damage noted, no tendon damage noted, no underlying fracture noted and no vascular damage noted     Contaminated: no   Treatment:    Area cleansed with:  Saline and povidone-iodine   Amount of cleaning:  Extensive   Irrigation solution:  Sterile saline   Irrigation method:  Pressure wash   Visualized foreign bodies/material removed: no     Debridement:  None   Undermining:  Extensive   Scar revision: no   Skin repair:    Repair method:  Sutures   Suture size:  5-0   Wound skin closure material used: Monocryl.   Suture technique:  Running   Number of sutures:  12 Approximation:    Approximation:  Close Repair type:    Repair type:  Simple Post-procedure details:    Dressing:  Sterile dressing   Procedure completion:  Tolerated well, no immediate complications Comments:     Stellate laceration    ____________________________________________  DIFFERENTIAL DIAGNOSIS   Intracranial hemorrhage, dehydration, electrolyte abnormality, viral illness  CLINICAL IMPRESSION / ASSESSMENT AND PLAN / ED COURSE  Medications ordered in the ED: Medications  lidocaine-EPINEPHrine (XYLOCAINE W/EPI) 2 %-1:100000 (with pres) injection 20 mL (20 mLs Infiltration Given 09/30/20 0949)  Tdap (BOOSTRIX) injection 0.5 mL (0.5 mLs Intramuscular Given 09/30/20 0825)    Pertinent labs & imaging results that were available during my care  of the patient were reviewed by me and considered in my medical decision making (see chart for details).  Erica Valencia was evaluated in Emergency Department on 09/30/2020 for the symptoms described in the history of present illness. She was evaluated in the context of the global COVID-19 pandemic, which necessitated consideration that  the patient might be at risk for infection with the SARS-CoV-2 virus that causes COVID-19. Institutional protocols and algorithms that pertain to the evaluation of patients at risk for COVID-19 are in a state of rapid change based on information released by regulatory bodies including the CDC and federal and state organizations. These policies and algorithms were followed during the patient's care in the ED.     Clinical Course as of 09/30/20 1157  Mon Sep 30, 2020  3818 Patient presents with syncope without worrisome prodromal symptoms.  No red flags.  Vital signs normal.  Labs unremarkable.  Will obtain CT head, update tetanus, repair forehead laceration [PS]  0950 Laceration repaired.  Patient did have an episode of bradycardia in the ED with a heart rate of 40 which was associated with feeling lightheaded.  Passed after about 1 minute.  No cardiac symptoms during that time.  Case discussed with Dr. Kirke Corin with cardiology who will arrange for a ZIO monitor to be placed before she leaves and follow-up.  Will prescribe paxlovid for COVID. [PS]    Clinical Course User Index [PS] Sharman Cheek, MD     ____________________________________________   FINAL CLINICAL IMPRESSION(S) / ED DIAGNOSES    Final diagnoses:  Scalp laceration, initial encounter  Syncope, unspecified syncope type  COVID-19 virus infection     ED Discharge Orders         Ordered    Nirmatrelvir & Ritonavir (PAXLOVID) 20 x 150 MG & 10 x 100MG  TBPK  2 times daily        09/30/20 1155    ondansetron (ZOFRAN ODT) 4 MG disintegrating tablet  Every 8 hours PRN        09/30/20 1155     Ambulatory referral for Covid Treatment        09/30/20 1155          Portions of this note were generated with dragon dictation software. Dictation errors may occur despite best attempts at proofreading.   10/02/20, MD 09/30/20 1201

## 2020-09-30 NOTE — ED Notes (Signed)
Provided graham crackers and ginger ale to pt upon request because pt stated was feeling nauseous and hadn't eaten since yesterday. Was cleaning blood off face of pt and then pt suddenly became nauseous and dizzy and monitor showed that HR was 41. Pt states this is not typical for her. HR came up to 55, now 65. EDP informed of episode of bradycardia and dizziness.

## 2020-09-30 NOTE — ED Triage Notes (Signed)
Pt denies being on blood thinners.

## 2020-09-30 NOTE — ED Provider Notes (Signed)
Emergency Medicine Provider Triage Evaluation Note  Erica Valencia , a 70 y.o. female  was evaluated in triage.  Pt complains of syncope.  Awoke, felt nauseous so ate a cracker.  Was using the toilet to urinate when she had a syncopal episode.  Presents with laceration to forehead.  Review of Systems  Positive: Syncope Negative: Chest pain  Physical Exam  BP 124/66 (BP Location: Left Arm)   Pulse 85   Temp 98.9 F (37.2 C) (Oral)   Resp 20   Ht 4\' 11"  (1.499 m)   Wt 61.2 kg   SpO2 93%   BMI 27.27 kg/m  Gen:   Awake, no distress   Resp:  Normal effort  MSK:   Moves extremities without difficulty  Other:  Laceration to forehead, no cervical spine tenderness to palpation  Medical Decision Making  Medically screening exam initiated at 6:37 AM.  Appropriate orders placed.  Erica Valencia was informed that the remainder of the evaluation will be completed by another provider, this initial triage assessment does not replace that evaluation, and the importance of remaining in the ED until their evaluation is complete.     [name], MD 09/30/20 [id number]

## 2020-09-30 NOTE — Telephone Encounter (Signed)
-----   Message from Creig Hines, NP sent at 09/30/2020  9:52 AM EDT ----- Good morning.  This pt is getting a zio placed in the ED this AM (hospital taking care of it).  She will need f/u w/ TG or APP in about 4-6 wks.  Thanks,  Thayer Ohm

## 2020-09-30 NOTE — ED Triage Notes (Signed)
Pt states that she woke up to go to the bathroom, she felt lightheaded, went and got a cracker because she did not eat all day started toward the restroom and the next thing she knew her husband was standing above her and blood was all over from her hitting her head on the bathroom door post

## 2020-09-30 NOTE — ED Notes (Signed)
Holter monitor has been placed on pt by cardiology team. Pt desires to discharge home. EDP will discharge pt home.

## 2020-10-01 ENCOUNTER — Telehealth: Payer: Self-pay

## 2020-10-01 ENCOUNTER — Telehealth: Payer: Self-pay | Admitting: Internal Medicine

## 2020-10-01 DIAGNOSIS — R55 Syncope and collapse: Secondary | ICD-10-CM | POA: Diagnosis not present

## 2020-10-01 NOTE — Telephone Encounter (Signed)
The patient has a question on what medications should she take. She was seen in the ED on yesterday 09/30/20 for a head laceration. However, she was tested for Covid19. The test came back positive, but the ED physician stated that it may be a false positive due to the patient have bronchitis. The patient wants to know should she take the medication for Bronchitis or take the medication for COVID.

## 2020-10-01 NOTE — Telephone Encounter (Signed)
Pt needs appt try to get virtual appt scheduled pleased asap here or [facility]   There is no medication other than over the counter meds:  Mucinex dm green label for cough.  Vitamin C 1000 mg daily.  Vitamin D3 4000 Iu (units) daily.  Zinc 100 mg daily.  Quercetin 250-500 mg 2 times per day   Elderberry  Oil of oregano  cepacol or chloroseptic spray  Warm tea with honey and lemon  Hydration  Try to eat though you dont feel like it   Tylenol or Advil  Nasal saline  Flonase     Monitor pulse oximeter, buy from [facility] if oxygen is less than 90 please go to the hospital.        Are you feeling really sick? Shortness of breath, cough, chest pain?, dizziness? Confusion   If so let me know  If worsening, go to hospital or [facility] clinic Urgent care for further treatment

## 2020-10-01 NOTE — Telephone Encounter (Signed)
Called to discuss with patient about COVID-19 symptoms and the use of one of the available treatments for those with mild to moderate Covid symptoms and at a high risk of hospitalization.  Pt appears to qualify for outpatient treatment due to co-morbid conditions and/or a member of an at-risk group in accordance with the FDA Emergency Use Authorization.    Pt stated the MD had already called her in a Rx for paxlovid. Symptoms tier reviewed as well as criteria for ending isolation. Preventative practices reviewed. Patient verbalized understanding. Patient advised to go to Urgent care or ED with severe symptoms.    Essie Hart, RN

## 2020-10-01 NOTE — Telephone Encounter (Signed)
Please advise 

## 2020-10-02 ENCOUNTER — Other Ambulatory Visit: Payer: Self-pay

## 2020-10-02 ENCOUNTER — Encounter: Payer: Self-pay | Admitting: Internal Medicine

## 2020-10-02 ENCOUNTER — Telehealth (INDEPENDENT_AMBULATORY_CARE_PROVIDER_SITE_OTHER): Payer: Medicare HMO | Admitting: Internal Medicine

## 2020-10-02 VITALS — BP 118/72 | Ht 59.0 in | Wt 135.0 lb

## 2020-10-02 DIAGNOSIS — I959 Hypotension, unspecified: Secondary | ICD-10-CM | POA: Diagnosis not present

## 2020-10-02 DIAGNOSIS — S0181XD Laceration without foreign body of other part of head, subsequent encounter: Secondary | ICD-10-CM

## 2020-10-02 DIAGNOSIS — J42 Unspecified chronic bronchitis: Secondary | ICD-10-CM

## 2020-10-02 DIAGNOSIS — R55 Syncope and collapse: Secondary | ICD-10-CM

## 2020-10-02 DIAGNOSIS — U071 COVID-19: Secondary | ICD-10-CM

## 2020-10-02 MED ORDER — ALBUTEROL SULFATE HFA 108 (90 BASE) MCG/ACT IN AERS: 1.0000 | INHALATION_SPRAY | Freq: Four times a day (QID) | RESPIRATORY_TRACT | 11 refills | Status: DC | PRN

## 2020-10-02 NOTE — Progress Notes (Signed)
Virtual Visit via Video Note  I connected with Erica Valencia  on 10/02/20 at  2:30 PM EDT by a video enabled telemedicine application and verified that I am speaking with the correct person using two identifiers.  Location patient: home, [place] Location provider:work or home office Persons participating in the virtual visit: patient, provider  I discussed the limitations of evaluation and management by telemedicine and the availability of in person appointments. The patient expressed understanding and agreed to proceed.   HPI: Loc 09/30/20 at home and hit right head with 12 internal stitches which will dissolve. She is doing ok but her BP was low in the ED not sure how low at home off meds since [facility] ed visit and BP 139/72 and 118/72. She was on toprol 25 mg qd and norvasc 2.5 bid she will hold these for now and take toprol 25 mg qd and norvasc 2.5 mg qd if BP is high >130/>80 as of 08/06/20 BP home was 138/68 and 129/70 on norvasc 2.5 mg bid and toprol 25 mg qd  She has 10 day holter and cards appt 11/11/20 for review   covid + 5/16/22sx's include cough taking mucinex and h/a. She is wearing a mask Monday after mothers day she went out to eat and Thursdays hangs out with friend christine who so far is negative but will test again for covid Saturday 10/05/20 Denies sob, fever, no further episodes of loc  She does have h/o chronic bronchitis not using albuterol inhaler but will try for cough  Wt goal is 130 she gained 6 lbs then lost 3 lbs   -COVID-19 vaccine status: 2/2 moderna  ROS: See pertinent positives and negatives per HPI.  Past Medical History:  Diagnosis Date  . Allergy   . COVID-19    09/30/20  . Hyperlipidemia   . Hypertension   . Scarlet fever    as child caused vision issues has to wear glasses now    Past Surgical History:  Procedure Laterality Date  . APPENDECTOMY     age [age]  . CARPAL TUNNEL RELEASE     left 2008  . OTHER SURGICAL HISTORY     left great toe fusion  2012      Current Outpatient Medications:  .  metoprolol succinate (TOPROL-XL) 25 MG 24 hr tablet, Take 1 tablet (25 mg total) by mouth daily., Disp: 90 tablet, Rfl: 3 .  Multiple Vitamin (MULTIVITAMIN) tablet, Take 1 tablet by mouth daily., Disp: , Rfl:  .  albuterol (VENTOLIN HFA) 108 (90 Base) MCG/ACT inhaler, Inhale 1-2 puffs into the lungs every 6 (six) hours as needed for wheezing or shortness of breath., Disp: 18 g, Rfl: 11 .  amLODipine (NORVASC) 2.5 MG tablet, TAKE 1 TABLET BY MOUTH TWICE A DAY (Patient not taking: Reported on 10/02/2020), Disp: 180 tablet, Rfl: 3 .  aspirin EC 81 MG tablet, Take 81 mg by mouth every other day. (Patient not taking: Reported on 10/02/2020), Disp: , Rfl:  .  Nirmatrelvir & Ritonavir (PAXLOVID) 20 x 150 MG & 10 x 100MG  TBPK, Take 3 tablets by mouth in the morning and at bedtime for 5 days. Take according to package instruction (Patient not taking: Reported on 10/02/2020), Disp: 30 tablet, Rfl: 0 .  ondansetron (ZOFRAN ODT) 4 MG disintegrating tablet, Take 1 tablet (4 mg total) by mouth every 8 (eight) hours as needed for nausea or vomiting. (Patient not taking: Reported on 10/02/2020), Disp: 20 tablet, Rfl: 0  EXAM:  VITALS per  patient if applicable:  GENERAL: alert, oriented, appears well and in no acute distress  HEENT: atraumatic, conjunttiva clear, no obvious abnormalities on inspection of external nose and ears  NECK: normal movements of the head and neck  LUNGS: on inspection no signs of respiratory distress, breathing rate appears normal, no obvious gross SOB, gasping or wheezing  CV: no obvious cyanosis  MS: moves all visible extremities without noticeable abnormality  PSYCH/NEURO: pleasant and cooperative, no obvious depression or anxiety, speech and thought processing grossly intact  ASSESSMENT AND PLAN:  Discussed the following assessment and plan:  COVID-19 covid care provided  Rec monitor O2 goal >90%  Cough mucinex dm  Prn  albuterol inhaler refilled h/o chronic bronchitis  Quarantine 10-14 days  Consider booster 3rd in 90 days and 4th dose in 5-6 months   Hypotension, unspecified hypotension type As of 10/02/20 holding toprol 25 mg xl qd and norvasc 2.5 mg bid She will monitor BP and add back BP meds  1/2 pill toprol 25. Mg xl qd if elevated and then if needed norvasc 2.5 mg qhs when bp runs higher   Syncope, 10 day holter provided and cards f/u sch 11/11/20   Laceration of forehead, subsequent encounter Dissolvable sutures disc vitamin E/mederma for scar healing and steri strips   -we discussed possible serious and likely etiologies, options for evaluation and workup, limitations of telemedicine visit vs in person visit, treatment, treatment risks and precautions.  I discussed the assessment and treatment plan with the patient. The patient was provided an opportunity to ask questions and all were answered. The patient agreed with the plan and demonstrated an understanding of the instructions.    Time spent 20 min Bevelyn Buckles, MD

## 2020-10-02 NOTE — Progress Notes (Signed)
Patient tested positive for COVID Monday in the ER. Patient was being seen for loss of consciousness and head injury.    Still having headache and pain in her stiches.

## 2020-10-02 NOTE — Telephone Encounter (Signed)
Patient scheduled for virtual today at 2:00 pm

## 2020-10-02 NOTE — Patient Instructions (Signed)
For covid over the counter to try Mucinex dm green label for cough.  Vitamin C 1000 mg daily.  Vitamin D3 4000 Iu (units) daily.  Zinc 100 mg daily.  Quercetin 250-500 mg 2 times per day   Elderberry  Oil of oregano  cepacol or chloroseptic spray sore throat  Warm salt gargles Warm tea with honey and lemon  Hydration  Try to eat though you dont feel like it   Tylenol or Advil  Nasal saline or Flonase 2 sprays as needed nasal congestion  Over the counter allergy pill claritin, xyzal, zyrtec, allegra     Monitor pulse oximeter, buy from [facility] if oxygen is less than 90 please go to the hospital.      Are you feeling really sick? Shortness of breath, cough, chest pain?, dizziness? Confusion   If so let me know  If worsening, go to hospital or [facility] clinic Urgent care for further treatment

## 2020-10-03 NOTE — Addendum Note (Signed)
Addended by: Quentin Ore on: 10/03/2020 08:04 AM   Modules accepted: Orders

## 2020-10-09 ENCOUNTER — Other Ambulatory Visit: Payer: Medicare HMO

## 2020-10-16 ENCOUNTER — Ambulatory Visit: Payer: Medicare HMO | Admitting: Internal Medicine

## 2020-10-23 ENCOUNTER — Telehealth: Payer: Self-pay | Admitting: *Deleted

## 2020-10-23 NOTE — Telephone Encounter (Signed)
Left voicemail message to call back for review of results.  

## 2020-10-23 NOTE — Telephone Encounter (Signed)
-----   Message from Creig Hines, NP sent at 10/23/2020  4:31 PM EDT ----- Monitor placed following hospitalization for syncope.  Predominantly normal rhtym w/ 21 short runs of fast heart rhythms coming from the top chambers.  These sometimes got quite fast- up to 203 beats/min.  It's possible that the fast heart rhythms seen could cause lightheadedness, though they apparently did not during the monitoring period, as triggered events were not associated with any significant arrhythmias.  Cont metoprolol and f/u w/ Dr. Mariah Milling later this month as planned.

## 2020-10-24 NOTE — Telephone Encounter (Signed)
Called and spoke to pt.  Notified of monitor results and provider's recc.  Pt voiced understanding.  She will continue metoprolol succinate 25mg  daily.  Pt will f/u with Dr. 11/11/20.  Pt has no further questions at this time.

## 2020-10-24 NOTE — Telephone Encounter (Signed)
Patient calling to discuss recent testing results  ° °Please call  ° °

## 2020-11-11 ENCOUNTER — Ambulatory Visit: Payer: Medicare HMO | Admitting: Cardiovascular Disease

## 2020-11-11 ENCOUNTER — Encounter: Payer: Self-pay | Admitting: Cardiovascular Disease

## 2020-11-11 ENCOUNTER — Other Ambulatory Visit: Payer: Self-pay

## 2020-11-11 VITALS — BP 150/70 | HR 66 | Ht 59.0 in | Wt 138.0 lb

## 2020-11-11 DIAGNOSIS — I10 Essential (primary) hypertension: Secondary | ICD-10-CM | POA: Diagnosis not present

## 2020-11-11 DIAGNOSIS — R55 Syncope and collapse: Secondary | ICD-10-CM

## 2020-11-11 DIAGNOSIS — E782 Mixed hyperlipidemia: Secondary | ICD-10-CM | POA: Diagnosis not present

## 2020-11-11 NOTE — Progress Notes (Addendum)
Date:  11/11/2020   ID:  Erica Valencia, DOB April 26, 1951, MRN [id number]  Patient Location:  [address] [place] [address]   Provider location:   [facility], [place] office  PCP:  [name], [name], MD  Cardiologist:  Fonnie Mu  Chief Complaint  Patient presents with   Follow-up    Discuss Zio monitor results. Medications reviewed by the patient verbally.     History of Present Illness:    Erica Valencia is a 70 y.o. female past medical history of Hyperlipidemia Hypertension Presents for f/u of her chest pain, palpitations, syncope  Got out of bed to urinate, syncope 09/30/2020 Husband revived her, he drove her to ER Remembers drive in "Passed out 2x in ER" Covid positive, treated with paxlovid for COVID.  Back to normal strength 2 weeks ago Now exercising  Not on amlodipine Back on metoprolol succinate 25 daily Blood pressure stable at home  Zio monitor reviewed Patient had a min HR of 41 bpm, max HR of 203 bpm, and avg HR of 64 bpm. Predominant underlying rhythm was Sinus Rhythm.   21 Supraventricular Tachycardia runs occurred, the run with the fastest interval lasting 15 beats with a max rate of 203 bpm, the longest lasting 19 beats with an avg rate of 99 bpm. Not patient triggered    Isolated SVEs were rare (<1.0%), SVE Couplets were rare (<1.0%), and SVE Triplets were rare (<1.0%). Isolated VEs were rare (<1.0%, 1735), VE Couplets were rare (<1.0%, 3), and VE Triplets were rare (<1.0%, 1). Ventricular Trigeminy was present.   Patient triggered events not associated with significant arrhythmia   EKG personally reviewed by myself on todays visit Nsr rate 66 bpm no significant ST or T wave changes  Other past medical history reviewed Seen in the emergency room September 12, 2018 for chest pain, anxiety described as a squeezing, substernal, nonradiating.    Lab work reviewed HBA1C 5.5 Total chol 225 LDL  143   Prior CV studies:   The following studies were reviewed today:  last stress test was in 1995 and last echo in 2017 in [place].    Past Medical History:  Diagnosis Date   Allergy    Bronchitis    COVID-19    09/30/20   Hyperlipidemia    Hypertension    Scarlet fever    as child caused vision issues has to wear glasses now   Syncope    09/30/20   Past Surgical History:  Procedure Laterality Date   APPENDECTOMY     age [age]   CARPAL TUNNEL RELEASE     left 2008   OTHER SURGICAL HISTORY     left great toe fusion 2012      Current Meds  Medication Sig   albuterol (VENTOLIN HFA) 108 (90 Base) MCG/ACT inhaler Inhale 1-2 puffs into the lungs every 6 (six) hours as needed for wheezing or shortness of breath.   metoprolol succinate (TOPROL-XL) 25 MG 24 hr tablet Take 1 tablet (25 mg total) by mouth daily.   Multiple Vitamin (MULTIVITAMIN) tablet Take 1 tablet by mouth daily.   [DISCONTINUED] metoprolol succinate (TOPROL-XL) 25 MG 24 hr tablet Take 25 mg by mouth as needed.     Allergies:   Sulfa antibiotics, Telmisartan, Zetia [ezetimibe], and Penicillins   Social History   Tobacco Use   Smoking status: Never   Smokeless tobacco: Never  Vaping Use   Vaping Use: Never used  Substance  Use Topics   Alcohol use: No   Drug use: No     Current Outpatient Medications on File Prior to Visit  Medication Sig Dispense Refill   albuterol (VENTOLIN HFA) 108 (90 Base) MCG/ACT inhaler Inhale 1-2 puffs into the lungs every 6 (six) hours as needed for wheezing or shortness of breath. 18 g 11   metoprolol succinate (TOPROL-XL) 25 MG 24 hr tablet Take 1 tablet (25 mg total) by mouth daily. 90 tablet 3   Multiple Vitamin (MULTIVITAMIN) tablet Take 1 tablet by mouth daily.     amLODipine (NORVASC) 2.5 MG tablet TAKE 1 TABLET BY MOUTH TWICE A DAY (Patient not taking: No sig reported) 180 tablet 3   No current facility-administered medications on file prior to visit.      Family Hx: The patient's family history includes Heart attack in her father; Other in her mother. There is no history of Breast cancer.  ROS:   Please see the history of present illness.    Review of Systems  Constitutional: Negative.   HENT: Negative.    Respiratory: Negative.    Cardiovascular: Negative.   Gastrointestinal: Negative.   Musculoskeletal: Negative.   Neurological:  Positive for loss of consciousness.  Psychiatric/Behavioral: Negative.    All other systems reviewed and are negative.    Labs/Other Tests and Data Reviewed:    Recent Labs: 09/30/2020: ALT 21; BUN 10; Creatinine, Ser 0.67; Hemoglobin 14.2; Platelets 247; Potassium 3.7; Sodium 139   Recent Lipid Panel Lab Results  Component Value Date/Time   CHOL 199 04/15/2020 08:22 AM   TRIG 127.0 04/15/2020 08:22 AM   HDL 55.10 04/15/2020 08:22 AM   CHOLHDL 4 04/15/2020 08:22 AM   LDLCALC 119 (H) 04/15/2020 08:22 AM    Wt Readings from Last 3 Encounters:  11/11/20 138 lb (62.6 kg)  10/02/20 135 lb (61.2 kg)  09/30/20 135 lb (61.2 kg)     Exam:    Vital Signs: Vital signs may also be detailed in the HPI BP (!) 150/70 (BP Location: Left Arm, Patient Position: Sitting, Cuff Size: Normal)   Pulse 66   Ht 4\' 11"  (1.499 m)   Wt 138 lb (62.6 kg)   SpO2 98%   BMI 27.87 kg/m    Constitutional:  oriented to person, place, and time. No distress.  HENT:  Head: Grossly normal Eyes:  no discharge. No scleral icterus.  Neck: No JVD, no carotid bruits  Cardiovascular: Regular rate and rhythm, no murmurs appreciated Pulmonary/Chest: Clear to auscultation bilaterally, no wheezes or rails Abdominal: Soft.  no distension.  no tenderness.  Musculoskeletal: Normal range of motion Neurological:  normal muscle tone. Coordination normal. No atrophy Skin: Skin warm and dry Psychiatric: normal affect, pleasant  ASSESSMENT & PLAN:    Syncope COVID positive, Work-up in the hospital reviewed ZIO monitor  reviewed We did discuss updating her echocardiogram, reports that she feels fine  Essential hypertension Blood pressure well controlled Amlodipine held, she will continue metoprolol  Chest pain No recent symptoms, no further work-up at this time  Hyperlipidemia, unspecified hyperlipidemia type Prefers not to take a statin We have previously discussed Zetia, and CT coronary calcium scoring  Long discussion concerning recent hospitalization, causes of syncope  Total encounter time more than 35 minutes  Greater than 50% was spent in counseling and coordination of care with the patient   Signed, , MD  11/11/2020 6:20 PM    [facility] Office [address], [place], [place] [address]

## 2020-11-11 NOTE — Patient Instructions (Addendum)
Medication Instructions:  No changes  If you need a refill on your cardiac medications before your next appointment, please call your pharmacy.   Lab work: No new labs needed  Testing/Procedures: No new testing needed  Follow-Up: At [facility], you and your health needs are our priority.  As part of our continuing mission to provide you with exceptional heart care, we have created designated Provider Care Teams.  These Care Teams include your primary Cardiologist (physician) and Advanced Practice Providers (APPs -  Physician Assistants and Nurse Practitioners) who all work together to provide you with the care you need, when you need it.  You will need a follow up appointment in 12 months  Providers on your designated Care Team:   Christopher Berge, NP Ryan Dunn, PA-C Jacquelyn Visser, PA-C Cadence Furth, PA-C  COVID-19 Vaccine Information can be found at: [email or web address] For questions related to vaccine distribution or appointments, please email [email or web address] or call [phone].    

## 2020-11-13 ENCOUNTER — Other Ambulatory Visit: Payer: Self-pay

## 2020-11-13 ENCOUNTER — Other Ambulatory Visit (INDEPENDENT_AMBULATORY_CARE_PROVIDER_SITE_OTHER): Payer: Medicare HMO

## 2020-11-13 DIAGNOSIS — Z1329 Encounter for screening for other suspected endocrine disorder: Secondary | ICD-10-CM | POA: Diagnosis not present

## 2020-11-13 DIAGNOSIS — I10 Essential (primary) hypertension: Secondary | ICD-10-CM | POA: Diagnosis not present

## 2020-11-13 DIAGNOSIS — E785 Hyperlipidemia, unspecified: Secondary | ICD-10-CM | POA: Diagnosis not present

## 2020-11-13 DIAGNOSIS — R739 Hyperglycemia, unspecified: Secondary | ICD-10-CM

## 2020-11-13 LAB — CBC WITH DIFFERENTIAL/PLATELET
Basophils Absolute: 0 10*3/uL (ref 0.0–0.1)
Basophils Relative: 0.4 % (ref 0.0–3.0)
Eosinophils Absolute: 0.1 10*3/uL (ref 0.0–0.7)
Eosinophils Relative: 1 % (ref 0.0–5.0)
HCT: 41.1 % (ref 36.0–46.0)
Hemoglobin: 14.2 g/dL (ref 12.0–15.0)
Lymphocytes Relative: 30.9 % (ref 12.0–46.0)
Lymphs Abs: 1.8 10*3/uL (ref 0.7–4.0)
MCHC: 34.5 g/dL (ref 30.0–36.0)
MCV: 85.7 fl (ref 78.0–100.0)
Monocytes Absolute: 0.6 10*3/uL (ref 0.1–1.0)
Monocytes Relative: 10.8 % (ref 3.0–12.0)
Neutro Abs: 3.3 10*3/uL (ref 1.4–7.7)
Neutrophils Relative %: 56.9 % (ref 43.0–77.0)
Platelets: 261 10*3/uL (ref 150.0–400.0)
RBC: 4.8 Mil/uL (ref 3.87–5.11)
RDW: 13.1 % (ref 11.5–15.5)
WBC: 5.9 10*3/uL (ref 4.0–10.5)

## 2020-11-13 LAB — HEMOGLOBIN A1C: Hgb A1c MFr Bld: 5.7 % (ref 4.6–6.5)

## 2020-11-13 LAB — LIPID PANEL
Cholesterol: 219 mg/dL — ABNORMAL HIGH (ref 0–200)
HDL: 57 mg/dL (ref >39.00)
LDL Cholesterol: 141 mg/dL — ABNORMAL HIGH (ref 0–99)
NonHDL: 162.35
Total CHOL/HDL Ratio: 4
Triglycerides: 106 mg/dL (ref 0.0–149.0)
VLDL: 21.2 mg/dL (ref 0.0–40.0)

## 2020-11-13 LAB — TSH: TSH: 2.02 u[IU]/mL (ref 0.35–4.50)

## 2020-11-15 ENCOUNTER — Ambulatory Visit (INDEPENDENT_AMBULATORY_CARE_PROVIDER_SITE_OTHER): Payer: Medicare HMO | Admitting: Internal Medicine

## 2020-11-15 ENCOUNTER — Encounter: Payer: Self-pay | Admitting: Internal Medicine

## 2020-11-15 ENCOUNTER — Other Ambulatory Visit: Payer: Self-pay

## 2020-11-15 VITALS — BP 140/80 | HR 67 | Temp 97.8°F | Ht 59.0 in | Wt 137.8 lb

## 2020-11-15 DIAGNOSIS — Z1231 Encounter for screening mammogram for malignant neoplasm of breast: Secondary | ICD-10-CM | POA: Diagnosis not present

## 2020-11-15 DIAGNOSIS — I10 Essential (primary) hypertension: Secondary | ICD-10-CM | POA: Diagnosis not present

## 2020-11-15 DIAGNOSIS — R7303 Prediabetes: Secondary | ICD-10-CM | POA: Diagnosis not present

## 2020-11-15 DIAGNOSIS — E785 Hyperlipidemia, unspecified: Secondary | ICD-10-CM

## 2020-11-15 DIAGNOSIS — M810 Age-related osteoporosis without current pathological fracture: Secondary | ICD-10-CM | POA: Diagnosis not present

## 2020-11-15 DIAGNOSIS — Z1389 Encounter for screening for other disorder: Secondary | ICD-10-CM

## 2020-11-15 MED ORDER — METOPROLOL SUCCINATE ER 25 MG PO TB24: 25.0000 mg | ORAL_TABLET | Freq: Every day | ORAL | 3 refills | Status: DC

## 2020-11-15 NOTE — Patient Instructions (Addendum)
Call and schedule mammogram and bone density 01/23/21   Prediabetes Eating Plan Prediabetes is a condition that causes blood sugar (glucose) levels to be higher than normal. This increases the risk for developing type 2 diabetes (type 2 diabetes mellitus). Working with a health care provider or nutrition specialist (dietitian) to make diet and lifestyle changes can help prevent the onset of diabetes. These changes may help you: Control your blood glucose levels. Improve your cholesterol levels. Manage your blood pressure. What are tips for following this plan? Reading food labels Read food labels to check the amount of fat, salt (sodium), and sugar in prepackaged foods. Avoid foods that have: Saturated fats. Trans fats. Added sugars. Avoid foods that have more than 300 milligrams (mg) of sodium per serving. Limit your sodium intake to less than 2,300 mg each day. Shopping Avoid buying pre-made and processed foods. Avoid buying drinks with added sugar. Cooking Cook with olive oil. Do not use butter, lard, or ghee. Bake, broil, grill, steam, or boil foods. Avoid frying. Meal planning  Work with your dietitian to create an eating plan that is right for you. This may include tracking how many calories you take in each day. Use a food diary, notebook, or mobile application to track what you eat at each meal. Consider following a Mediterranean diet. This includes: Eating several servings of fresh fruits and vegetables each day. Eating fish at least twice a week. Eating one serving each day of whole grains, beans, nuts, and seeds. Using olive oil instead of other fats. Limiting alcohol. Limiting red meat. Using nonfat or low-fat dairy products. Consider following a plant-based diet. This includes dietary choices that focus on eating mostly vegetables and fruit, grains, beans, nuts, and seeds. If you have high blood pressure, you may need to limit your sodium intake or follow a diet such as the  DASH (Dietary Approaches to Stop Hypertension) eating plan. The DASH diet aims to lower high blood pressure.  Lifestyle Set weight loss goals with help from your health care team. It is recommended that most people with prediabetes lose 7% of their body weight. Exercise for at least 30 minutes 5 or more days a week. Attend a support group or seek support from a mental health counselor. Take over-the-counter and prescription medicines only as told by your health care provider. What foods are recommended? Fruits Berries. Bananas. Apples. Oranges. Grapes. Papaya. Mango. Pomegranate. Kiwi.Grapefruit. Cherries. Vegetables Lettuce. Spinach. Peas. Beets. Cauliflower. Cabbage. Broccoli. Carrots.Tomatoes. Squash. Eggplant. Herbs. Peppers. Onions. Cucumbers. Brussels sprouts. Grains Whole grains, such as whole-wheat or whole-grain breads, crackers, cereals, and pasta. Unsweetened oatmeal. Bulgur. Barley. Quinoa. Brown rice. Corn orwhole-wheat flour tortillas or taco shells. Meats and other proteins Seafood. Poultry without skin. Lean cuts of pork and beef. Tofu. Eggs. Nuts.Beans. Dairy Low-fat or fat-free dairy products, such as yogurt, cottage cheese, and cheese. Beverages Water. Tea. Coffee. Sugar-free or diet soda. Seltzer water. Low-fat or nonfatmilk. Milk alternatives, such as soy or almond milk. Fats and oils Olive oil. Canola oil. Sunflower oil. Grapeseed oil. Avocado. Walnuts. Sweets and desserts Sugar-free or low-fat pudding. Sugar-free or low-fat ice cream and other frozentreats. Seasonings and condiments Herbs. Sodium-free spices. Mustard. Relish. Low-salt, low-sugar ketchup.Low-salt, low-sugar barbecue sauce. Low-fat or fat-free mayonnaise. The items listed above may not be a complete list of recommended foods and beverages. Contact a dietitian for more information. What foods are not recommended? Fruits Fruits canned with syrup. Vegetables Canned vegetables. Frozen vegetables with  butter or cream sauce. Grains Refined  white flour and flour products, such as bread, pasta, snack foods, andcereals. Meats and other proteins Fatty cuts of meat. Poultry with skin. Breaded or fried meat. Processed meats. Dairy Full-fat yogurt, cheese, or milk. Beverages Sweetened drinks, such as iced tea and soda. Fats and oils Butter. Lard. Ghee. Sweets and desserts Baked goods, such as cake, cupcakes, pastries, cookies, and cheesecake. Seasonings and condiments Spice mixes with added salt. Ketchup. Barbecue sauce. Mayonnaise. The items listed above may not be a complete list of foods and beverages that are not recommended. Contact a dietitian for more information. Where to find more information American Diabetes Association: www.diabetes.org Summary You may need to make diet and lifestyle changes to help prevent the onset of diabetes. These changes can help you control blood sugar, improve cholesterol levels, and manage blood pressure. Set weight loss goals with help from your health care team. It is recommended that most people with prediabetes lose 7% of their body weight. Consider following a Mediterranean diet. This includes eating plenty of fresh fruits and vegetables, whole grains, beans, nuts, seeds, fish, and low-fat dairy, and using olive oil instead of other fats. This information is not intended to replace advice given to you by your health care provider. Make sure you discuss any questions you have with your healthcare provider. Document Revised: 08/03/2019 Document Reviewed: 08/03/2019 Elsevier Patient Education  2022 [employer].   Ref. Range 04/15/2020 08:22 09/30/2020 06:33 09/30/2020 08:50 11/13/2020 08:41  Total CHOL/HDL Ratio Unknown 4   4  Cholesterol Latest Ref Range: 0 - 200 mg/dL 710   626 (H)  HDL Cholesterol Latest Ref Range: >39.00 mg/dL 94.85   46.27  LDL (calc) Latest Ref Range: 0 - 99 mg/dL 035 (H)   009 (H)  NonHDL Unknown 144.28   162.35  Triglycerides  Latest Ref Range: 0.0 - 149.0 mg/dL 381.8   299.3  VLDL Latest Ref Range: 0.0 - 40.0 mg/dL 71.6   96.7

## 2020-11-15 NOTE — Progress Notes (Addendum)
Chief Complaint  Patient presents with   Follow-up   F/u  1. Controlled bp on norvasc 2.5 mg bid toprol xl 25 mg qd  2. Hld worse  3. New dx prediabetes   Review of Systems  Constitutional:  Negative for weight loss.  HENT:  Negative for hearing loss.   Eyes:  Negative for blurred vision.  Respiratory:  Negative for shortness of breath.   Cardiovascular:  Negative for chest pain.  Gastrointestinal:  Negative for abdominal pain.  Musculoskeletal:  Negative for falls and joint pain.  Skin:  Negative for rash.  Neurological:  Negative for headaches.  Psychiatric/Behavioral:  Negative for depression.   Past Medical History:  Diagnosis Date   Allergy    Bronchitis    COVID-19    09/30/20   Hyperlipidemia    Hypertension    Scarlet fever    as child caused vision issues has to wear glasses now   Syncope    09/30/20   Past Surgical History:  Procedure Laterality Date   APPENDECTOMY     age [age]   CARPAL TUNNEL RELEASE     left 2008   OTHER SURGICAL HISTORY     left great toe fusion 2012    Family History  Problem Relation Age of Onset   Other Mother        brain tumor died 83; heavy smoker    Heart attack Father        61s   Breast cancer Neg Hx    Social History   Socioeconomic History   Marital status: Married    Spouse name: Not on file   Number of children: Not on file   Years of education: Not on file   Highest education level: Not on file  Occupational History   Not on file  Tobacco Use   Smoking status: Never   Smokeless tobacco: Never  Vaping Use   Vaping Use: Never used  Substance and Sexual Activity   Alcohol use: No   Drug use: No   Sexual activity: Never    Partners: Male  Other Topics Concern   Not on file  Social History Narrative   Moved from [place] 2018   Mother was [place] she was born in [place] father American soldier never knew    Married    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   [DISCONTINUED] amLODipine (NORVASC) 2.5 MG tablet TAKE 1 TABLET BY MOUTH TWICE A DAY   [DISCONTINUED] metoprolol succinate (TOPROL-XL) 25 MG 24 hr tablet Take 1 tablet (25 mg total) by mouth daily.   Allergies  Allergen Reactions   Sulfa Antibiotics Itching    Throat tight   Telmisartan     Heart skipping beats, flank pain and back pain once stopped resolved and nightmares per pt     Zetia [Ezetimibe]     Back,kidney pain, muscle aches thighs   Penicillins Rash    Did it involve swelling of the face/tongue/throat, SOB, or low BP? No Did it involve sudden or  severe rash/hives, skin peeling, or any reaction on the inside of your mouth or nose? No Did you need to seek medical attention at a hospital or doctor's office? No When did it last happen?      childhood If all above answers are "NO", may proceed with cephalosporin use.   Recent Results (from the past 2160 hour(s))  Urinalysis, Routine w reflex microscopic     Status: Abnormal   Collection Time: 05/20/21  7:56 AM  Result Value Ref Range   Color, Urine YELLOW YELLOW   APPearance CLEAR CLEAR   Specific Gravity, Urine 1.006 1.001 - 1.035   pH 7.0 5.0 - 8.0   Glucose, UA NEGATIVE NEGATIVE   Bilirubin Urine NEGATIVE NEGATIVE   Ketones, ur NEGATIVE NEGATIVE   Hgb urine dipstick NEGATIVE NEGATIVE   Protein, ur NEGATIVE NEGATIVE   Nitrite NEGATIVE NEGATIVE   Leukocytes,Ua 2+ (A) NEGATIVE   WBC, UA 6-10 (A) 0 - 5 /HPF   RBC / HPF NONE SEEN 0 - 2 /HPF   Squamous Epithelial / LPF NONE SEEN < OR = 5 /HPF   Bacteria, UA NONE SEEN NONE SEEN /HPF   Hyaline Cast  NONE SEEN NONE SEEN /LPF  Hemoglobin A1c     Status: None   Collection Time: 05/20/21  7:56 AM  Result Value Ref Range   Hgb A1c MFr Bld 5.7 4.6 - 6.5 %    Comment: Glycemic Control Guidelines for People with Diabetes:Non Diabetic:  <6%Goal of Therapy: <7%Additional Action Suggested:  >8%   CBC with Differential/Platelet     Status: None   Collection Time: 05/20/21  7:56 AM  Result Value Ref Range   WBC 4.9 4.0 - 10.5 K/uL   RBC 4.79 3.87 - 5.11 Mil/uL   Hemoglobin 14.1 12.0 - 15.0 g/dL   HCT 41.7 36.0 - 46.0 %   MCV 87.1 78.0 - 100.0 fl   MCHC 33.7 30.0 - 36.0 g/dL   RDW 13.2 11.5 - 15.5 %   Platelets 253.0 150.0 - 400.0 K/uL   Neutrophils Relative % 57.5 43.0 - 77.0 %   Lymphocytes Relative 29.9 12.0 - 46.0 %   Monocytes Relative 10.1 3.0 - 12.0 %   Eosinophils Relative 1.8 0.0 - 5.0 %   Basophils Relative 0.7 0.0 - 3.0 %   Neutro Abs 2.8 1.4 - 7.7 K/uL   Lymphs Abs 1.5 0.7 - 4.0 K/uL   Monocytes Absolute 0.5 0.1 - 1.0 K/uL   Eosinophils Absolute 0.1 0.0 - 0.7 K/uL   Basophils Absolute 0.0 0.0 - 0.1 K/uL  Lipid panel     Status: Abnormal   Collection Time: 05/20/21  7:56 AM  Result Value Ref Range   Cholesterol 220 (H) 0 - 200 mg/dL    Comment: ATP III Classification       Desirable:  < 200 mg/dL               Borderline High:  200 - 239 mg/dL          High:  > = 240 mg/dL   Triglycerides 107.0 0.0 - 149.0 mg/dL    Comment: Normal:  <150 mg/dLBorderline High:  150 - 199 mg/dL   HDL 68.30 >39.00 mg/dL   VLDL 21.4 0.0 - 40.0 mg/dL   LDL Cholesterol 130 (H) 0 - 99 mg/dL   Total CHOL/HDL Ratio 3     Comment:                Men  Women1/2 Average Risk     3.4          3.3Average Risk          5.0          4.42X Average Risk          9.6          7.13X Average Risk          15.0          11.0                       NonHDL 151.62     Comment: NOTE:  Non-HDL goal should be 30 mg/dL higher than patient's LDL goal (i.e. LDL goal of < 70 mg/dL, would have non-HDL goal of < 100  mg/dL)  Comprehensive metabolic panel     Status: None   Collection Time: 05/20/21  7:56 AM  Result Value Ref Range   Sodium 138 135 - 145 mEq/L   Potassium 3.9 3.5 - 5.1 mEq/L   Chloride 101 96 - 112 mEq/L   CO2 30 19 - 32 mEq/L   Glucose, Bld 88 70 - 99 mg/dL   BUN 11 6 - 23 mg/dL   Creatinine, Ser 0.73 0.40 - 1.20 mg/dL   Total Bilirubin 0.7 0.2 - 1.2 mg/dL   Alkaline Phosphatase 63 39 - 117 U/L   AST 23 0 - 37 U/L   ALT 20 0 - 35 U/L   Total Protein 6.8 6.0 - 8.3 g/dL   Albumin 4.4 3.5 - 5.2 g/dL   GFR 82.35 >60.00 mL/min    Comment: Calculated using the CKD-EPI Creatinine Equation (2021)   Calcium 9.5 8.4 - 10.5 mg/dL  MICROSCOPIC MESSAGE     Status: None   Collection Time: 05/20/21  7:56 AM  Result Value Ref Range   Note      Comment: This urine was analyzed for the presence of WBC,  RBC, bacteria, casts, and other formed elements.  Only those elements seen were reported. . .    Objective  Body mass index is 27.83 kg/m. Wt Readings from Last 3 Encounters:  05/22/21 131 lb 12.8 oz (59.8 kg)  11/15/20 137 lb 12.8 oz (62.5 kg)  11/11/20 138 lb (62.6 kg)   Temp Readings from Last 3 Encounters:  05/22/21 97.6 F (36.4 C) (Temporal)  11/15/20 97.8 F (36.6 C) (Oral)  09/30/20 98.9 F (37.2 C) (Oral)   BP Readings from Last 3 Encounters:  05/22/21 130/76  11/15/20 140/80  11/11/20 (!) 150/70   Pulse Readings from Last 3 Encounters:  05/22/21 69  11/15/20 67  11/11/20 66    Physical Exam Vitals and nursing note reviewed.  Constitutional:      Appearance: Normal appearance. She is well-developed and well-groomed.  HENT:     Head: Normocephalic and atraumatic.  Cardiovascular:     Rate and Rhythm: Normal rate and regular rhythm.     Heart sounds: Normal heart sounds. No murmur heard. Pulmonary:     Effort: Pulmonary effort is normal.     Breath sounds: Normal breath sounds.  Abdominal:     Tenderness: There is no abdominal tenderness.  Skin:     General: Skin is warm and dry.  Neurological:     General: No focal deficit present.     Mental Status: She is alert and oriented to person, place, and time. Mental status is at baseline.     Gait: Gait  normal.  Psychiatric:        Attention and Perception: Attention and perception normal.        Mood and Affect: Mood and affect normal.        Speech: Speech normal.        Behavior: Behavior normal. Behavior is cooperative.        Thought Content: Thought content normal.        Cognition and Memory: Cognition and memory normal.        Judgment: Judgment normal.   Assessment  Plan  Essential hypertension - Plan: : metoprolol succinate (TOPROL-XL) 25 MG 24 hr tablet and norvasc 2.5 up to bid  Hyperlipidemia, unspecified hyperlipidemia type  Prediabetes - Plan: Hemoglobin A1c  Osteoporosis, unspecified osteoporosis type, unspecified pathological fracture presence - Plan: DG Bone Density   HM Flu wait 2021  -called [facility] CT today 10/18/2018 their system only goes back 18 months and pna 23 not in system consider in future   -04/17/20 declines    prevnar had 05/17/14 brought proof of vx  Tdap per pt had 2013 zostervax had 2015 has not had shingrix and declines another shingles vaccine had rash with 1st  moderna 2/2   Last pap 11/28/15 Dr. Barbera Setters neg pap +atrophy HPV neg    Last colonoscopy 2015 nl per pt need to get records per pt they rec in 10 years no h/o polyps she has had colonoscopy x 2  -Dr. Fayrene Fearing [facility] prior -think about wants to due spring 2022 call back for referral as of 04/17/20      My eye doctor seen 08/2019   DEXA 01/24/19 -2.9 osteoporosis hold prolia declines for now Repeat DEXA 01/23/21    mammo 01/24/19 negative pt wants to wait 01/23/21                 Disc hep B/C/MMR testing pt declines for now No skin issues    rec healthy diet and exercise   Provider: Dr. Olivia Mackie [name]-Internal Medicine

## 2020-12-04 ENCOUNTER — Ambulatory Visit: Payer: Medicare HMO

## 2020-12-13 ENCOUNTER — Telehealth: Payer: Self-pay | Admitting: Internal Medicine

## 2020-12-13 NOTE — Telephone Encounter (Signed)
Left message for patient to call back and schedule Medicare Annual Wellness Visit (AWV) in office.   If not able to come in office, please offer to do virtually or by telephone.   Last AWV:12/04/2019  Please schedule at anytime with Nurse Health Advisor.

## 2020-12-23 ENCOUNTER — Telehealth: Payer: Self-pay | Admitting: Internal Medicine

## 2020-12-23 DIAGNOSIS — R42 Dizziness and giddiness: Secondary | ICD-10-CM

## 2020-12-23 NOTE — Telephone Encounter (Signed)
Patient is has some questions about a previous fall.Please advise.

## 2020-12-24 NOTE — Telephone Encounter (Signed)
Left message to return call 

## 2020-12-24 NOTE — Telephone Encounter (Signed)
Patient passed out and fell then had hospital visit 09/30/20. Patient states the CT done then found nothing and the passing out was blamed on COVID-19 positive.   For the last 2 weeks Patient states she has been lightheaded all the time. Patient wondering if she needs a follow up CT done as she was informed head injuries can cause problems later down the line.   Please advise, no appointments available in our office this week

## 2020-12-24 NOTE — Telephone Encounter (Signed)
Patient is returning your call.  

## 2020-12-27 NOTE — Telephone Encounter (Signed)
Have her call and make appt with cardiology again  I am also referring to Neurology  Inform pt

## 2020-12-27 NOTE — Telephone Encounter (Signed)
LMTCB

## 2020-12-31 NOTE — Telephone Encounter (Signed)
Patient is agreeable to neurology referral but this has not been placed. Please advise

## 2020-12-31 NOTE — Telephone Encounter (Signed)
Patient informed and verbalized understanding

## 2021-01-01 NOTE — Telephone Encounter (Signed)
I have placed the order for neurology referral per  Dr French Ana recommendation.  Per review, she had recommended pt f/u with cardiology as well.  Please confirm.  Also, if persistent problems, will need to be reevaluated.  I placed the order for local neurologist (Dr Sherryll Burger [facility]).  If has different preference, will need new order placed for referral.

## 2021-01-01 NOTE — Telephone Encounter (Signed)
Patient informed and verbalized understanding.  Patient given number, address, and name for referral. Is okay to be seen by [facility] clinic

## 2021-01-08 NOTE — Telephone Encounter (Signed)
Was able to reach out to Erica Valencia, following-up on recent syncopal episodes. Dr. Mariah Milling advised  Certainly possible she has concussion from the fall  Agree with neurology   To determine if any cardiac issue, would recommend 2 simple checks such as blood pressure and heart rate sitting position,  And check blood pressure heart rate immediately after standing (orthostatics)   Prior event monitor was unrevealing  Triggered events were not associated with significant arrhythmia   Thx  TG   Pt reports appt Nov 11 with [facility] neurologist. Has ben monitoring BP, reports SBP 120-130s, sometimes SBP 140s after exercise, then comes back down, advised that is to be expected. Pt reports no cardiac issues at current. No new syncopal episodes.  Advised pt to call for an appt if syncopal episodes are reoccurring and if her BP changes "drops" during those times as it may be cardiac related. Educated pt on orthostatic BP when feeling of fainting, lightheadedness, or passing out". Erica Valencia verbalized understanding and thankful for the f/u call.

## 2021-03-28 DIAGNOSIS — R413 Other amnesia: Secondary | ICD-10-CM | POA: Diagnosis not present

## 2021-03-28 DIAGNOSIS — R42 Dizziness and giddiness: Secondary | ICD-10-CM | POA: Diagnosis not present

## 2021-03-28 DIAGNOSIS — M810 Age-related osteoporosis without current pathological fracture: Secondary | ICD-10-CM | POA: Diagnosis not present

## 2021-05-20 ENCOUNTER — Other Ambulatory Visit (INDEPENDENT_AMBULATORY_CARE_PROVIDER_SITE_OTHER): Payer: Medicare HMO

## 2021-05-20 ENCOUNTER — Other Ambulatory Visit: Payer: Self-pay

## 2021-05-20 DIAGNOSIS — Z1389 Encounter for screening for other disorder: Secondary | ICD-10-CM

## 2021-05-20 DIAGNOSIS — R7303 Prediabetes: Secondary | ICD-10-CM

## 2021-05-20 DIAGNOSIS — I10 Essential (primary) hypertension: Secondary | ICD-10-CM

## 2021-05-20 LAB — CBC WITH DIFFERENTIAL/PLATELET
Basophils Absolute: 0 10*3/uL (ref 0.0–0.1)
Basophils Relative: 0.7 % (ref 0.0–3.0)
Eosinophils Absolute: 0.1 10*3/uL (ref 0.0–0.7)
Eosinophils Relative: 1.8 % (ref 0.0–5.0)
HCT: 41.7 % (ref 36.0–46.0)
Hemoglobin: 14.1 g/dL (ref 12.0–15.0)
Lymphocytes Relative: 29.9 % (ref 12.0–46.0)
Lymphs Abs: 1.5 10*3/uL (ref 0.7–4.0)
MCHC: 33.7 g/dL (ref 30.0–36.0)
MCV: 87.1 fl (ref 78.0–100.0)
Monocytes Absolute: 0.5 10*3/uL (ref 0.1–1.0)
Monocytes Relative: 10.1 % (ref 3.0–12.0)
Neutro Abs: 2.8 10*3/uL (ref 1.4–7.7)
Neutrophils Relative %: 57.5 % (ref 43.0–77.0)
Platelets: 253 10*3/uL (ref 150.0–400.0)
RBC: 4.79 Mil/uL (ref 3.87–5.11)
RDW: 13.2 % (ref 11.5–15.5)
WBC: 4.9 10*3/uL (ref 4.0–10.5)

## 2021-05-20 LAB — LIPID PANEL
Cholesterol: 220 mg/dL — ABNORMAL HIGH (ref 0–200)
HDL: 68.3 mg/dL (ref >39.00)
LDL Cholesterol: 130 mg/dL — ABNORMAL HIGH (ref 0–99)
NonHDL: 151.62
Total CHOL/HDL Ratio: 3
Triglycerides: 107 mg/dL (ref 0.0–149.0)
VLDL: 21.4 mg/dL (ref 0.0–40.0)

## 2021-05-20 LAB — COMPREHENSIVE METABOLIC PANEL
ALT: 20 U/L (ref 0–35)
AST: 23 U/L (ref 0–37)
Albumin: 4.4 g/dL (ref 3.5–5.2)
Alkaline Phosphatase: 63 U/L (ref 39–117)
BUN: 11 mg/dL (ref 6–23)
CO2: 30 mEq/L (ref 19–32)
Calcium: 9.5 mg/dL (ref 8.4–10.5)
Chloride: 101 mEq/L (ref 96–112)
Creatinine, Ser: 0.73 mg/dL (ref 0.40–1.20)
GFR: 82.35 mL/min (ref >60.00)
Glucose, Bld: 88 mg/dL (ref 70–99)
Potassium: 3.9 mEq/L (ref 3.5–5.1)
Sodium: 138 mEq/L (ref 135–145)
Total Bilirubin: 0.7 mg/dL (ref 0.2–1.2)
Total Protein: 6.8 g/dL (ref 6.0–8.3)

## 2021-05-20 LAB — HEMOGLOBIN A1C: Hgb A1c MFr Bld: 5.7 % (ref 4.6–6.5)

## 2021-05-21 LAB — URINALYSIS, ROUTINE W REFLEX MICROSCOPIC
Bacteria, UA: NONE SEEN /HPF
Bilirubin Urine: NEGATIVE
Glucose, UA: NEGATIVE
Hgb urine dipstick: NEGATIVE
Hyaline Cast: NONE SEEN /LPF
Ketones, ur: NEGATIVE
Nitrite: NEGATIVE
Protein, ur: NEGATIVE
RBC / HPF: NONE SEEN /HPF (ref 0–2)
Specific Gravity, Urine: 1.006 (ref 1.001–1.035)
Squamous Epithelial / HPF: NONE SEEN /HPF (ref <=5)
pH: 7 (ref 5.0–8.0)

## 2021-05-21 LAB — MICROSCOPIC MESSAGE

## 2021-05-22 ENCOUNTER — Ambulatory Visit (INDEPENDENT_AMBULATORY_CARE_PROVIDER_SITE_OTHER): Payer: Medicare HMO | Admitting: Internal Medicine

## 2021-05-22 ENCOUNTER — Encounter: Payer: Self-pay | Admitting: Internal Medicine

## 2021-05-22 ENCOUNTER — Other Ambulatory Visit: Payer: Self-pay

## 2021-05-22 VITALS — BP 130/76 | HR 69 | Temp 97.6°F | Ht 59.0 in | Wt 131.8 lb

## 2021-05-22 DIAGNOSIS — M67442 Ganglion, left hand: Secondary | ICD-10-CM | POA: Diagnosis not present

## 2021-05-22 DIAGNOSIS — E785 Hyperlipidemia, unspecified: Secondary | ICD-10-CM | POA: Diagnosis not present

## 2021-05-22 DIAGNOSIS — Z0000 Encounter for general adult medical examination without abnormal findings: Secondary | ICD-10-CM | POA: Diagnosis not present

## 2021-05-22 DIAGNOSIS — H8112 Benign paroxysmal vertigo, left ear: Secondary | ICD-10-CM | POA: Diagnosis not present

## 2021-05-22 DIAGNOSIS — R1084 Generalized abdominal pain: Secondary | ICD-10-CM

## 2021-05-22 DIAGNOSIS — I10 Essential (primary) hypertension: Secondary | ICD-10-CM

## 2021-05-22 DIAGNOSIS — Z1211 Encounter for screening for malignant neoplasm of colon: Secondary | ICD-10-CM | POA: Diagnosis not present

## 2021-05-22 DIAGNOSIS — Z1231 Encounter for screening mammogram for malignant neoplasm of breast: Secondary | ICD-10-CM

## 2021-05-22 MED ORDER — AMLODIPINE BESYLATE 2.5 MG PO TABS: 2.5000 mg | ORAL_TABLET | Freq: Two times a day (BID) | ORAL | 3 refills | Status: DC

## 2021-05-22 MED ORDER — METOPROLOL SUCCINATE ER 25 MG PO TB24: 12.5000 mg | ORAL_TABLET | Freq: Two times a day (BID) | ORAL | 3 refills | Status: DC

## 2021-05-22 NOTE — Patient Instructions (Addendum)
Call me back if abdominal pain returns would rec imaging   [facility]  Phone Fax E-mail Address  [phone] Not available [address]   [place] [address]     Specialties     Gastroenterology        Digital mucous cyst web MD or [facility]  Address    [phone] Not available [address]   [place] [address]      Specialties     Orthopedic Surgery       Consider MRI brain if not better Upcoming Encounters Date Type Specialty Care Team Description  07/25/2021 Office Visit Neurology Jolene Provost, MD   [address]   [facility]   [place], [place] [address]   [phone] (Work)   [phone] ([address])     Carlynn Herald, [place], [place]   [address]   [place], [place] [address]   [phone] (Work)   [phone] (Fax)       ENT Dr. McQueen/Vaught  Phone Fax E-mail Address  [phone] Not available [address]   [place] [address]     Specialties     Otolaryngology        Benign Positional Vertigo Vertigo is the feeling that you or your surroundings are moving when they are not. Benign positional vertigo is the most common form of vertigo. This is usually a harmless condition (benign). This condition is positional. This means that symptoms are triggered by certain movements and positions. This condition can be dangerous if it occurs while you are doing something that could cause harm to yourself or others. This includes activities such as driving or operating machinery. What are the causes? The inner ear has fluid-filled canals that help your brain sense movement and balance. When the fluid moves, the brain receives messages about your body's position. With benign positional vertigo, calcium crystals in the inner ear break free and disturb the inner ear area. This causes your brain to receive confusing messages about  your body's position. What increases the risk? You are more likely to develop this condition if: You are a woman. You are 45 years of age or older. You have recently had a head injury. You have an inner ear disease. What are the signs or symptoms? Symptoms of this condition usually happen when you move your head or your eyes in different directions. Symptoms may start suddenly and usually last for less than a minute. They include: Loss of balance and falling. Feeling like you are spinning or moving. Feeling like your surroundings are spinning or moving. Nausea and vomiting. Blurred vision. Dizziness. Involuntary eye movement (nystagmus). Symptoms can be mild and cause only minor problems, or they can be severe and interfere with daily life. Episodes of benign positional vertigo may return (recur) over time. Symptoms may also improve over time. How is this diagnosed? This condition may be diagnosed based on: Your medical history. A physical exam of the head, neck, and ears. Positional tests to check for or stimulate vertigo. You may be asked to turn your head and change positions, such as going from sitting to lying down. A health care provider will watch for symptoms of vertigo. You may be referred to a health care provider who specializes in ear, nose, and throat problems (ENT or otolaryngologist) or a provider who specializes in disorders of the nervous system (neurologist). How is this treated? This condition may  be treated in a session in which your health care provider moves your head in specific positions to help the displaced crystals in your inner ear move. Treatment for this condition may take several sessions. Surgery may be needed in severe cases, but this is rare. In some cases, benign positional vertigo may resolve on its own in 2-4 weeks. Follow these instructions at home: Safety Move slowly. Avoid sudden body or head movements or certain positions, as told by your health  care provider. Avoid driving or operating machinery until your health care provider says it is safe. Avoid doing any tasks that would be dangerous to you or others if vertigo occurs. If you have trouble walking or keeping your balance, try using a cane for stability. If you feel dizzy or unstable, sit down right away. Return to your normal activities as told by your health care provider. Ask your health care provider what activities are safe for you. General instructions Take over-the-counter and prescription medicines only as told by your health care provider. Drink enough fluid to keep your urine pale yellow. Keep all follow-up visits. This is important. Contact a health care provider if: You have a fever. Your condition gets worse or you develop new symptoms. Your family or friends notice any behavioral changes. You have nausea or vomiting that gets worse. You have numbness or a prickling and tingling sensation. Get help right away if you: Have difficulty speaking or moving. Are always dizzy or faint. Develop severe headaches. Have weakness in your legs or arms. Have changes in your hearing or vision. Develop a stiff neck. Develop sensitivity to light. These symptoms may represent a serious problem that is an emergency. Do not wait to see if the symptoms will go away. Get medical help right away. Call your local emergency services (911 in the [place] Do not drive yourself to the hospital. Summary Vertigo is the feeling that you or your surroundings are moving when they are not. Benign positional vertigo is the most common form of vertigo. This condition is caused by calcium crystals in the inner ear that become displaced. This causes a disturbance in an area of the inner ear that helps your brain sense movement and balance. Symptoms include loss of balance and falling, feeling that you or your surroundings are moving, nausea and vomiting, and blurred vision. This condition can be  diagnosed based on symptoms, a physical exam, and positional tests. Follow safety instructions as told by your health care provider and keep all follow-up visits. This is important. This information is not intended to replace advice given to you by your health care provider. Make sure you discuss any questions you have with your health care provider. Document Revised: 04/03/2020 Document Reviewed: 04/03/2020 Elsevier Patient Education  2022 [employer].

## 2021-05-22 NOTE — Progress Notes (Signed)
Chief Complaint  Patient presents with   Annual Exam   Annual  1. Htn controlled on norvasc 2.5 mg qd and toprol 12.5 mg bid and better controls BP than 12.5 mg qd  2. C/o generalized ab pain after eating pomegrante seeds but resolved  3. C/o feeling off balance and cant lie back or on the left saw Dr. Manuella Ghazi 03/28/21 [facility] 4. Left handed and c/o lesion to index finger she does a lot of crafting  Review of Systems  Constitutional:  Negative for weight loss.  HENT:  Negative for hearing loss.   Eyes:  Negative for blurred vision.  Respiratory:  Negative for shortness of breath.   Cardiovascular:  Negative for chest pain.  Gastrointestinal:  Negative for abdominal pain and blood in stool.  Genitourinary:  Negative for dysuria.  Musculoskeletal:  Negative for falls and joint pain.  Skin:  Negative for rash.  Neurological:  Positive for dizziness. Negative for headaches.  Psychiatric/Behavioral:  Negative for depression.   Past Medical History:  Diagnosis Date   Allergy    Bronchitis    COVID-19    09/30/20   Hyperlipidemia    Hypertension    Scarlet fever    as child caused vision issues has to wear glasses now   Syncope    09/30/20   Past Surgical History:  Procedure Laterality Date   APPENDECTOMY     age [age]   CARPAL TUNNEL RELEASE     left 2008   OTHER SURGICAL HISTORY     left great toe fusion 2012    Family History  Problem Relation Age of Onset   Other Mother        brain tumor died 59; heavy smoker    Heart attack Father        9s   Breast cancer Neg Hx    Social History   Socioeconomic History   Marital status: Married    Spouse name: Not on file   Number of children: Not on file   Years of education: Not on file   Highest education level: Not on file  Occupational History   Not on file  Tobacco Use   Smoking status: Never   Smokeless tobacco: Never  Vaping Use   Vaping Use: Never used  Substance and Sexual Activity   Alcohol use: No   Drug use:  No   Sexual activity: Never    Partners: Male  Other Topics Concern   Not on file  Social History Narrative   Moved from [place] 2018   Mother was [place] she was born in [place] father American soldier never knew    Married    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   [DISCONTINUED] amLODipine (NORVASC) 2.5 MG tablet TAKE 1 TABLET BY MOUTH TWICE A DAY   [DISCONTINUED] metoprolol succinate (TOPROL-XL) 25 MG 24 hr tablet Take 1 tablet (25 mg total) by mouth daily.   Allergies  Allergen Reactions   Sulfa Antibiotics Itching    Throat tight   Telmisartan  Heart skipping beats, flank pain and back pain once stopped resolved and nightmares per pt     Zetia [Ezetimibe]     Back,kidney pain, muscle aches thighs   Penicillins Rash    Did it involve swelling of the face/tongue/throat, SOB, or low BP? No Did it involve sudden or severe rash/hives, skin peeling, or any reaction on the inside of your mouth or nose? No Did you need to seek medical attention at a hospital or doctor's office? No When did it last happen?      childhood If all above answers are NO, may proceed with cephalosporin use.   Recent Results (from the past 2160 hour(s))  Urinalysis, Routine w reflex microscopic     Status: Abnormal   Collection Time: 05/20/21  7:56 AM  Result Value Ref Range   Color, Urine YELLOW YELLOW   APPearance CLEAR CLEAR   Specific Gravity, Urine 1.006 1.001 - 1.035   pH 7.0 5.0 - 8.0   Glucose, UA NEGATIVE NEGATIVE   Bilirubin Urine NEGATIVE NEGATIVE   Ketones, ur NEGATIVE  NEGATIVE   Hgb urine dipstick NEGATIVE NEGATIVE   Protein, ur NEGATIVE NEGATIVE   Nitrite NEGATIVE NEGATIVE   Leukocytes,Ua 2+ (A) NEGATIVE   WBC, UA 6-10 (A) 0 - 5 /HPF   RBC / HPF NONE SEEN 0 - 2 /HPF   Squamous Epithelial / LPF NONE SEEN < OR = 5 /HPF   Bacteria, UA NONE SEEN NONE SEEN /HPF   Hyaline Cast NONE SEEN NONE SEEN /LPF  Hemoglobin A1c     Status: None   Collection Time: 05/20/21  7:56 AM  Result Value Ref Range   Hgb A1c MFr Bld 5.7 4.6 - 6.5 %    Comment: Glycemic Control Guidelines for People with Diabetes:Non Diabetic:  <6%Goal of Therapy: <7%Additional Action Suggested:  >8%   CBC with Differential/Platelet     Status: None   Collection Time: 05/20/21  7:56 AM  Result Value Ref Range   WBC 4.9 4.0 - 10.5 K/uL   RBC 4.79 3.87 - 5.11 Mil/uL   Hemoglobin 14.1 12.0 - 15.0 g/dL   HCT 41.7 36.0 - 46.0 %   MCV 87.1 78.0 - 100.0 fl   MCHC 33.7 30.0 - 36.0 g/dL   RDW 13.2 11.5 - 15.5 %   Platelets 253.0 150.0 - 400.0 K/uL   Neutrophils Relative % 57.5 43.0 - 77.0 %   Lymphocytes Relative 29.9 12.0 - 46.0 %   Monocytes Relative 10.1 3.0 - 12.0 %   Eosinophils Relative 1.8 0.0 - 5.0 %   Basophils Relative 0.7 0.0 - 3.0 %   Neutro Abs 2.8 1.4 - 7.7 K/uL   Lymphs Abs 1.5 0.7 - 4.0 K/uL   Monocytes Absolute 0.5 0.1 - 1.0 K/uL   Eosinophils Absolute 0.1 0.0 - 0.7 K/uL   Basophils Absolute 0.0 0.0 - 0.1 K/uL  Lipid panel     Status: Abnormal   Collection Time: 05/20/21  7:56 AM  Result Value Ref Range   Cholesterol 220 (H) 0 - 200 mg/dL    Comment: ATP III Classification       Desirable:  < 200 mg/dL               Borderline High:  200 - 239 mg/dL          High:  > = 240 mg/dL   Triglycerides 107.0 0.0 - 149.0 mg/dL    Comment: Normal:  <150 mg/dLBorderline High:  150 -  199 mg/dL   HDL 68.30 >39.00 mg/dL   VLDL 21.4 0.0 - 40.0 mg/dL   LDL Cholesterol 130 (H) 0 - 99 mg/dL   Total CHOL/HDL Ratio 3     Comment:                Men          Women1/2 Average Risk     3.4           3.3Average Risk          5.0          4.42X Average Risk          9.6          7.13X Average Risk          15.0          11.0                       NonHDL 151.62     Comment: NOTE:  Non-HDL goal should be 30 mg/dL higher than patient's LDL goal (i.e. LDL goal of < 70 mg/dL, would have non-HDL goal of < 100 mg/dL)  Comprehensive metabolic panel     Status: None   Collection Time: 05/20/21  7:56 AM  Result Value Ref Range   Sodium 138 135 - 145 mEq/L   Potassium 3.9 3.5 - 5.1 mEq/L   Chloride 101 96 - 112 mEq/L   CO2 30 19 - 32 mEq/L   Glucose, Bld 88 70 - 99 mg/dL   BUN 11 6 - 23 mg/dL   Creatinine, Ser 0.73 0.40 - 1.20 mg/dL   Total Bilirubin 0.7 0.2 - 1.2 mg/dL   Alkaline Phosphatase 63 39 - 117 U/L   AST 23 0 - 37 U/L   ALT 20 0 - 35 U/L   Total Protein 6.8 6.0 - 8.3 g/dL   Albumin 4.4 3.5 - 5.2 g/dL   GFR 82.35 >60.00 mL/min    Comment: Calculated using the CKD-EPI Creatinine Equation (2021)   Calcium 9.5 8.4 - 10.5 mg/dL  MICROSCOPIC MESSAGE     Status: None   Collection Time: 05/20/21  7:56 AM  Result Value Ref Range   Note      Comment: This urine was analyzed for the presence of WBC,  RBC, bacteria, casts, and other formed elements.  Only those elements seen were reported. . .    Objective  Body mass index is 26.62 kg/m. Wt Readings from Last 3 Encounters:  05/22/21 131 lb 12.8 oz (59.8 kg)  11/15/20 137 lb 12.8 oz (62.5 kg)  11/11/20 138 lb (62.6 kg)   Temp Readings from Last 3 Encounters:  05/22/21 97.6 F (36.4 C) (Temporal)  11/15/20 97.8 F (36.6 C) (Oral)  09/30/20 98.9 F (37.2 C) (Oral)   BP Readings from Last 3 Encounters:  05/22/21 130/76  11/15/20 140/80  11/11/20 (!) 150/70   Pulse Readings from Last 3 Encounters:  05/22/21 69  11/15/20 67  11/11/20 66    Physical Exam Vitals and nursing note reviewed.  Constitutional:      Appearance: Normal appearance. She is well-developed and well-groomed.  HENT:     Head: Normocephalic and  atraumatic.  Eyes:     Conjunctiva/sclera: Conjunctivae normal.     Pupils: Pupils are equal, round, and reactive to light.  Cardiovascular:     Rate and Rhythm: Normal rate and regular rhythm.     Heart sounds: Normal heart  sounds. No murmur heard. Pulmonary:     Effort: Pulmonary effort is normal.     Breath sounds: Normal breath sounds.  Abdominal:     General: Abdomen is flat. Bowel sounds are normal.     Tenderness: There is no abdominal tenderness.  Musculoskeletal:        General: No tenderness.  Skin:    General: Skin is warm and dry.  Neurological:     General: No focal deficit present.     Mental Status: She is alert and oriented to person, place, and time. Mental status is at baseline.     Cranial Nerves: Cranial nerves 2-12 are intact.     Gait: Gait is intact.  Psychiatric:        Attention and Perception: Attention and perception normal.        Mood and Affect: Mood and affect normal.        Speech: Speech normal.        Behavior: Behavior normal. Behavior is cooperative.        Thought Content: Thought content normal.        Cognition and Memory: Cognition and memory normal.        Judgment: Judgment normal.    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 Barbera Setters neg pap +atrophy HPV neg    Last colonoscopy 2015 nl per pt need to get records per pt they rec in 10 years no h/o polyps she has had colonoscopy x 2  -Dr. Fayrene Fearing [facility] prior -referred for colonoscopy   Referred for mammogram    My eye doctor seen 08/2019   DEXA 01/24/19 -2.9 osteoporosis hold prolia declines for now Repeat DEXA ordered 05/22/21    mammo 01/24/19 negative pt wants to wait 01/23/21                 Disc hep B/C/MMR testing pt declines for now No skin issues    rec healthy diet and exercise    Provider: Dr. Olivia Mackie [name]-Internal Medicine

## 2021-05-27 DIAGNOSIS — R42 Dizziness and giddiness: Secondary | ICD-10-CM | POA: Diagnosis not present

## 2021-05-27 DIAGNOSIS — H8112 Benign paroxysmal vertigo, left ear: Secondary | ICD-10-CM | POA: Diagnosis not present

## 2021-06-02 ENCOUNTER — Other Ambulatory Visit: Payer: Self-pay

## 2021-06-02 ENCOUNTER — Ambulatory Visit
Admission: RE | Admit: 2021-06-02 | Discharge: 2021-06-02 | Disposition: A | Discharge status: Home / Self Care | Payer: Medicare HMO | Source: Ambulatory Visit | Attending: Internal Medicine | Admitting: Internal Medicine

## 2021-06-02 DIAGNOSIS — E785 Hyperlipidemia, unspecified: Secondary | ICD-10-CM | POA: Insufficient documentation

## 2021-06-02 DIAGNOSIS — I10 Essential (primary) hypertension: Secondary | ICD-10-CM | POA: Insufficient documentation

## 2021-06-08 NOTE — Progress Notes (Signed)
Date:  06/09/2021   ID:  [name], DOB August 17, 1950, MRN [id number]  Patient Location:  [address] [place] [address]   Provider location:   [facility], [place] office  PCP:  [name], [name], MD  Cardiologist:  [facility]  Chief Complaint  Patient presents with   Follow up Cat scan results.     "Doing well." Medications reviewed by the patient verbally.     History of Present Illness:    Erica Valencia is a 71 y.o. female past medical history of Hyperlipidemia Hypertension Coronary calcification on calcium scoring score 108 Presents for f/u of her chest pain, palpitations, syncope  Last seen in clinic June 2022  CT coronary calcium score ordered by primary care Calcium score 108 Long discussion concerning results  No sx of angina, active, no shortness of breath on exertion Tried statin in the past: Possibly 15 years ago, does not know which one and does not remember side effects  Reports having issues with Zetia listed in her allergies  back and leg pain but does not remember details  Father died of MI  Denies any recent near-syncope or syncope  Total cholesterol 220  EKG personally reviewed by myself on todays visit Normal sinus rhythm rate 62 bpm no significant ST-T wave changes  Other past medical history reviewed Got out of bed to urinate, syncope 09/30/2020 Husband revived her, he drove her to ER Remembers drive in "Passed out 2x in ER" Covid positive, treated with paxlovid for COVID.  Zio monitor  Patient had a min HR of 41 bpm, max HR of 203 bpm, and avg HR of 64 bpm. Predominant underlying rhythm was Sinus Rhythm.   21 Supraventricular Tachycardia runs occurred, the run with the fastest interval lasting 15 beats with a max rate of 203 bpm, the longest lasting 19 beats with an avg rate of 99 bpm. Not patient triggered    Isolated SVEs were rare (<1.0%), SVE Couplets were rare (<1.0%), and SVE  Triplets were rare (<1.0%). Isolated VEs were rare (<1.0%, 1735), VE Couplets were rare (<1.0%, 3), and VE Triplets were rare (<1.0%, 1). Ventricular Trigeminy was present.   Patient triggered events not associated with significant arrhythmia  Seen in the emergency room September 12, 2018 for chest pain, anxiety described as a squeezing, substernal, nonradiating.    last stress test was in 1995 and last echo in 2017 in [place].    Past Medical History:  Diagnosis Date   Allergy    Bronchitis    COVID-19    09/30/20   Hyperlipidemia    Hypertension    Scarlet fever    as child caused vision issues has to wear glasses now   Syncope    09/30/20   Past Surgical History:  Procedure Laterality Date   APPENDECTOMY     age [age]   CARPAL TUNNEL RELEASE     left 2008   OTHER SURGICAL HISTORY     left great toe fusion 2012      Current Meds  Medication Sig   amLODipine (NORVASC) 2.5 MG tablet Take 1 tablet (2.5 mg total) by mouth 2 (two) times daily.   metoprolol succinate (TOPROL-XL) 25 MG 24 hr tablet Take 0.5 tablets (12.5 mg total) by mouth in the morning and at bedtime.   Multiple Vitamin (MULTIVITAMIN) tablet Take 1 tablet by mouth daily.     Allergies:   Sulfa antibiotics, Telmisartan, Zetia [ezetimibe], and Penicillins  Social History   Tobacco Use   Smoking status: Never   Smokeless tobacco: Never  Vaping Use   Vaping Use: Never used  Substance Use Topics   Alcohol use: No   Drug use: No     Current Outpatient Medications on File Prior to Visit  Medication Sig Dispense Refill   amLODipine (NORVASC) 2.5 MG tablet Take 1 tablet (2.5 mg total) by mouth 2 (two) times daily. 180 tablet 3   metoprolol succinate (TOPROL-XL) 25 MG 24 hr tablet Take 0.5 tablets (12.5 mg total) by mouth in the morning and at bedtime. 45 tablet 3   Multiple Vitamin (MULTIVITAMIN) tablet Take 1 tablet by mouth daily.     No current facility-administered medications on file prior to  visit.     Family Hx: The patient's family history includes Heart attack in her father; Other in her mother. There is no history of Breast cancer.  ROS:   Please see the history of present illness.    Review of Systems  Constitutional: Negative.   HENT: Negative.    Respiratory: Negative.    Cardiovascular: Negative.   Gastrointestinal: Negative.   Musculoskeletal: Negative.   Neurological: Negative.   Psychiatric/Behavioral: Negative.    All other systems reviewed and are negative.    Labs/Other Tests and Data Reviewed:    Recent Labs: 11/13/2020: TSH 2.02 05/20/2021: ALT 20; BUN 11; Creatinine, Ser 0.73; Hemoglobin 14.1; Platelets 253.0; Potassium 3.9; Sodium 138   Recent Lipid Panel Lab Results  Component Value Date/Time   CHOL 220 (H) 05/20/2021 07:56 AM   TRIG 107.0 05/20/2021 07:56 AM   HDL 68.30 05/20/2021 07:56 AM   CHOLHDL 3 05/20/2021 07:56 AM   LDLCALC 130 (H) 05/20/2021 07:56 AM    Wt Readings from Last 3 Encounters:  06/09/21 133 lb 6 oz (60.5 kg)  05/22/21 131 lb 12.8 oz (59.8 kg)  11/15/20 137 lb 12.8 oz (62.5 kg)     Exam:    Vital Signs: Vital signs may also be detailed in the HPI BP (!) 150/72 (BP Location: Left Arm, Patient Position: Sitting, Cuff Size: Normal)    Pulse 62    Ht 4\' 11"  (1.499 m)    Wt 133 lb 6 oz (60.5 kg)    SpO2 97%    BMI 26.94 kg/m   Constitutional:  oriented to person, place, and time. No distress.  HENT:  Head: Grossly normal Eyes:  no discharge. No scleral icterus.  Neck: No JVD, no carotid bruits  Cardiovascular: Regular rate and rhythm, no murmurs appreciated Pulmonary/Chest: Clear to auscultation bilaterally, no wheezes or rails Abdominal: Soft.  no distension.  no tenderness.  Musculoskeletal: Normal range of motion Neurological:  normal muscle tone. Coordination normal. No atrophy Skin: Skin warm and dry Psychiatric: normal affect, pleasant   ASSESSMENT & PLAN:    Syncope In the setting of COVID No recurrent  episodes Work-up in the hospital  ZIO monitor reviewed No further work-up needed  Essential hypertension Blood pressure well controlled Continue metoprolol, amlodipine at current doses  Chest pain Denies chest pain, no further work-up needed at this time  Coronary calcium Low score, long discussion With total cholesterol 220 Does not want Zetia given prior possible side effects Does not really want a statin but willing to try Crestor 5 mg possibly every other day  Hyperlipidemia, unspecified hyperlipidemia type Plan as above, concerned about potential side effects from Zetia Will try Crestor 5 every other day hopefully titrating up to daily  Total encounter time more than 40 minutes  Greater than 50% was spent in counseling and coordination of care with the patient   Signed, Julien Nordmann, MD  06/09/2021 3:31 PM    [facility] [address], [place], [place] [address]

## 2021-06-09 ENCOUNTER — Encounter: Payer: Self-pay | Admitting: Cardiovascular Disease

## 2021-06-09 ENCOUNTER — Other Ambulatory Visit: Payer: Self-pay

## 2021-06-09 ENCOUNTER — Ambulatory Visit: Payer: Medicare HMO | Admitting: Cardiovascular Disease

## 2021-06-09 VITALS — BP 150/72 | HR 62 | Ht 59.0 in | Wt 133.4 lb

## 2021-06-09 DIAGNOSIS — I10 Essential (primary) hypertension: Secondary | ICD-10-CM

## 2021-06-09 DIAGNOSIS — R55 Syncope and collapse: Secondary | ICD-10-CM

## 2021-06-09 DIAGNOSIS — E782 Mixed hyperlipidemia: Secondary | ICD-10-CM

## 2021-06-09 MED ORDER — ROSUVASTATIN CALCIUM 5 MG PO TABS: 5.0000 mg | ORAL_TABLET | Freq: Every day | ORAL | 6 refills | Status: DC

## 2021-06-09 NOTE — Patient Instructions (Addendum)
Medication Instructions:  - Your physician has recommended you make the following change in your medication:   1) START crestor (rosuvastatin) 5 mg: - take 1 tablet by mouth once daily   If you need a refill on your cardiac medications before your next appointment, please call your pharmacy.   Lab work: - If you find that you are tolerating the cholesterol medication, then please call the office in about 1-2 months to let us know so we may set you up for a repeat cholesterol panel in about 3 months  Testing/Procedures: No new testing needed  Follow-Up: At [facility], you and your health needs are our priority.  As part of our continuing mission to provide you with exceptional heart care, we have created designated Provider Care Teams.  These Care Teams include your primary Cardiologist (physician) and Advanced Practice Providers (APPs -  Physician Assistants and Nurse Practitioners) who all work together to provide you with the care you need, when you need it.  You will need a follow up appointment in 12 months  Providers on your designated Care Team:   Nicolasa Duckinghristopher Berge, NP Eula Listenyan Dunn, PA-C Cadence Fransico MichaelFurth, [place] JerseyPA-C  COVID-19 Vaccine Information can be found at: [email or web address] For questions related to vaccine distribution or appointments, please email [email or web address] or call [phone].    CRESTOR (Rosuvastatin) Tablets What is this medication? ROSUVASTATIN (roe SOO va sta tin) treats high cholesterol and reduces the risk of heart attack and stroke. It works by decreasing bad cholesterol and fats (such as LDL, triglycerides), and increasing good cholesterol (HDL) in your blood. It belongs to a group of medications called statins. Changes to diet and exercise are often combined with this medication. This medicine may be used for other purposes; ask your health care provider or pharmacist if you have questions. COMMON  BRAND NAME(S): Crestor What should I tell my care team before I take this medication? They need to know if you have any of these conditions: Diabetes (high blood sugar) If you often drink alcohol Kidney disease Liver disease Muscle cramps, pain Stroke Thyroid disease An unusual or allergic reaction to rosuvastatin, other medications, foods, dyes, or preservatives Pregnant or trying to get pregnant Breast-feeding How should I use this medication? Take this medication by mouth with a glass of water. Follow the directions on the prescription label. You can take it with or without food. If it upsets your stomach, take it with food. Do not cut, crush or chew this medication. Swallow the tablets whole. Take your medication at regular intervals. Do not take it more often than directed. Take antacids that have a combination of aluminum and magnesium hydroxide in them at a different time of day than this medication. Take these products 2 hours AFTER this medication. Talk to your care team about the use of this medication in children. While this medication may be prescribed for children as young as 7 for selected conditions, precautions do apply. Overdosage: If you think you have taken too much of this medicine contact a poison control center or emergency room at once. NOTE: This medicine is only for you. Do not share this medicine with others. What if I miss a dose? If you miss a dose, take it as soon as you can. If your next dose is to be taken in less than 12 hours, then do not take the missed dose. Take the next dose at your regular time. Do not take double or extra doses. What may interact with this medication? Do  not take this medication with any of the following: Supplements like red yeast rice This medication may also interact with the following: Alcohol Antacids containing aluminum hydroxide and magnesium hydroxide Cyclosporine Other medications for high cholesterol Some medications for  HIV infection Warfarin This list may not describe all possible interactions. Give your health care provider a list of all the medicines, herbs, non-prescription drugs, or dietary supplements you use. Also tell them if you smoke, drink alcohol, or use illegal drugs. Some items may interact with your medicine. What should I watch for while using this medication? Visit your health care provider for regular checks on your progress. Tell your health care provider if your symptoms do not start to get better or if they get worse. Your health care provider may tell you to stop taking this medication if you develop muscle problems. If your muscle problems do not go away after stopping this medication, contact your health care provider. Do not become pregnant while taking this medication. Women should inform their health care provider if they wish to become pregnant or think they might be pregnant. There is potential for serious harm to an unborn child. Talk to your health care provider for more information. Do not breast-feed an infant while taking this medication. This medication may increase blood sugar. Ask your health care provider if changes in diet or medications are needed if you have diabetes. If you are going to need surgery or other procedure, tell your health care provider that you are using this medication. Taking this medication is only part of a total heart healthy program. Your health care provider may give you a special diet to follow. Avoid alcohol. Avoid smoking. Ask your health care provider how much you should exercise. What side effects may I notice from receiving this medication? Side effects that you should report to your care team as soon as possible: Allergic reactions--skin rash, itching, hives, swelling of the face, lips, tongue, or throat High blood sugar (hyperglycemia)--increased thirst or amount of urine, unusual weakness, fatigue, blurry vision Liver injury--right upper belly  pain, loss of appetite, nausea, light-colored stool, dark yellow or brown urine, yellowing skin or eyes, unusual weakness, fatigue Muscle injury--unusual weakness, fatigue, muscle pain, dark yellow or brown urine, decrease in amount of urine Redness, blistering, peeling or loosening of the skin, including inside the mouth Side effects that usually do not require medical attention (report to your care team if they continue or are bothersome): Fatigue Headache Nausea Stomach pain This list may not describe all possible side effects. Call your doctor for medical advice about side effects. You may report side effects to FDA at 1-800-FDA-1088. Where should I keep my medication? Keep out of the reach of children and pets. Store between 20 and 25 degrees C (68 and 77 degrees F). Get rid of any unused medication after the expiration date. To get rid of medications that are no longer needed or have expired: Take the medication to a medication take-back program. Check with your pharmacy or law enforcement to find a location. If you cannot return the medication, check the label or package insert to see if the medication should be thrown out in the garbage or flushed down the toilet. If you are not sure, ask your care team. If it is safe to put it in the trash, take the medication out of the container. Mix the medication with cat litter, dirt, coffee grounds, or other unwanted substance. Seal the mixture in a bag or container. Put  it in the trash. NOTE: This sheet is a summary. It may not cover all possible information. If you have questions about this medicine, talk to your doctor, pharmacist, or health care provider.  2022 Elsevier/Gold Standard (2020-05-31 00:00:00)

## 2021-06-10 DIAGNOSIS — R2232 Localized swelling, mass and lump, left upper limb: Secondary | ICD-10-CM | POA: Diagnosis not present

## 2021-06-18 ENCOUNTER — Encounter: Payer: Self-pay | Admitting: Cardiovascular Disease

## 2021-06-18 ENCOUNTER — Other Ambulatory Visit: Payer: Self-pay

## 2021-06-18 DIAGNOSIS — Z789 Other specified health status: Secondary | ICD-10-CM | POA: Insufficient documentation

## 2021-06-18 NOTE — Progress Notes (Signed)
Crestor removed from med list  Per Mychart message  Dr Mariah Milling, I have stopped taking the above-mentioned medication you prescribed to me on 06/09/2021.  After about 3 days on this medicine the following side effects began to come back.  1. I noticed my anxiety came back,  2. I began experiencing sleeplessness,  3. Some days I was confused and got my days mixed up.  It has taken me until this weekend ( I believe it was Sunday) to be rid of most of these effects.  This was the reason I didn't want to take a statin based drug. As I mentioned this to you when Dr.  Dr MacLean-Scocuzza prescribed a similar prescription for me about 3 years ago. Unless there is a safe non-statin cholesterol pill available, I don't think I want to try anything else.   Sincerely Erica Valencia 06/18/2021

## 2021-07-01 DIAGNOSIS — Z01818 Encounter for other preprocedural examination: Secondary | ICD-10-CM | POA: Diagnosis not present

## 2021-07-01 DIAGNOSIS — Z1211 Encounter for screening for malignant neoplasm of colon: Secondary | ICD-10-CM | POA: Diagnosis not present

## 2021-08-15 ENCOUNTER — Telehealth: Payer: Self-pay | Admitting: Internal Medicine

## 2021-08-15 NOTE — Telephone Encounter (Signed)
Copied from CRM [id number]. Topic: Medicare AWV ?>> Aug 15, 2021  2:23 PM Harris-Coley, [place] Gully wrote: ?Reason for CRM: Left message for patient to schedule Annual Wellness Visit.  Please schedule with Nurse Health Advisor Denisa O'Brien-Blaney, LPN at [facility].  Please call [phone] ask for Olegario Messier ?

## 2021-08-18 DIAGNOSIS — H5213 Myopia, bilateral: Secondary | ICD-10-CM | POA: Diagnosis not present

## 2021-09-02 ENCOUNTER — Ambulatory Visit
Admission: RE | Admit: 2021-09-02 | Discharge: 2021-09-02 | Disposition: A | Discharge status: Home / Self Care | Payer: Medicare HMO | Source: Ambulatory Visit | Attending: Internal Medicine | Admitting: Internal Medicine

## 2021-09-02 DIAGNOSIS — Z1231 Encounter for screening mammogram for malignant neoplasm of breast: Secondary | ICD-10-CM | POA: Diagnosis not present

## 2021-09-02 DIAGNOSIS — M810 Age-related osteoporosis without current pathological fracture: Secondary | ICD-10-CM | POA: Insufficient documentation

## 2021-09-02 DIAGNOSIS — Z780 Asymptomatic menopausal state: Secondary | ICD-10-CM | POA: Diagnosis not present

## 2021-09-02 DIAGNOSIS — M8589 Other specified disorders of bone density and structure, multiple sites: Secondary | ICD-10-CM | POA: Diagnosis not present

## 2021-09-04 ENCOUNTER — Telehealth: Payer: Self-pay

## 2021-09-04 NOTE — Telephone Encounter (Signed)
Lvm for pt to return call in regards to labs.  ? ?Per Dr.Tracy: ?Borderline osteoporosis but hip fracture risk score is high >3%  ?Does she want to see endocrine to discuss medication management of this?  ?

## 2021-09-23 ENCOUNTER — Encounter
Admission: RE | Disposition: A | Discharge status: Home / Self Care | Payer: Self-pay | Source: Ambulatory Visit | Attending: Gastroenterology

## 2021-09-23 ENCOUNTER — Ambulatory Visit: Payer: Medicare HMO | Admitting: Anesthesiology

## 2021-09-23 ENCOUNTER — Ambulatory Visit
Admission: RE | Admit: 2021-09-23 | Discharge: 2021-09-23 | Disposition: A | Discharge status: Home / Self Care | Payer: Medicare HMO | Source: Ambulatory Visit | Attending: Gastroenterology | Admitting: Gastroenterology

## 2021-09-23 ENCOUNTER — Encounter: Payer: Self-pay | Admitting: *Deleted

## 2021-09-23 ENCOUNTER — Other Ambulatory Visit: Payer: Self-pay

## 2021-09-23 DIAGNOSIS — K649 Unspecified hemorrhoids: Secondary | ICD-10-CM | POA: Diagnosis not present

## 2021-09-23 DIAGNOSIS — Z1211 Encounter for screening for malignant neoplasm of colon: Secondary | ICD-10-CM | POA: Insufficient documentation

## 2021-09-23 DIAGNOSIS — K640 First degree hemorrhoids: Secondary | ICD-10-CM | POA: Insufficient documentation

## 2021-09-23 DIAGNOSIS — I10 Essential (primary) hypertension: Secondary | ICD-10-CM | POA: Insufficient documentation

## 2021-09-23 DIAGNOSIS — K644 Residual hemorrhoidal skin tags: Secondary | ICD-10-CM | POA: Insufficient documentation

## 2021-09-23 HISTORY — DX: Sleep apnea, unspecified: G47.30

## 2021-09-23 HISTORY — PX: COLONOSCOPY WITH PROPOFOL: SHX5780

## 2021-09-23 SURGERY — COLONOSCOPY WITH PROPOFOL
Anesthesia: General

## 2021-09-23 MED ORDER — SODIUM CHLORIDE 0.9 % IV SOLN: INTRAVENOUS | Status: DC

## 2021-09-23 MED ORDER — PROPOFOL 500 MG/50ML IV EMUL
INTRAVENOUS | Status: DC | PRN
  Administered 2021-09-23: 150 ug/kg/min via INTRAVENOUS

## 2021-09-23 MED ORDER — PROPOFOL 10 MG/ML IV BOLUS
INTRAVENOUS | Status: DC | PRN
  Administered 2021-09-23: 100 mg via INTRAVENOUS

## 2021-09-23 NOTE — Interval H&P Note (Signed)
History and Physical Interval Note: ? ?09/23/2021 ?9:08 AM ? ?Erica Valencia  has presented today for surgery, with the diagnosis of Colon Cancer Screening.  The various methods of treatment have been discussed with the patient and family. After consideration of risks, benefits and other options for treatment, the patient has consented to  Procedure(s): ?COLONOSCOPY WITH PROPOFOL (N/A) as a surgical intervention.  The patient's history has been reviewed, patient examined, no change in status, stable for surgery.  I have reviewed the patient's chart and labs.  Questions were answered to the patient's satisfaction.   ? ? ?Rossie Muskrat Shanik [place] ? ?Ok to proceed with colonoscopy ?

## 2021-09-23 NOTE — Transfer of Care (Addendum)
Immediate Anesthesia Transfer of Care Note ? ?Patient: Erica Valencia ? ?Procedure(s) Performed: COLONOSCOPY WITH PROPOFOL ? ?Patient Location: PACU and Endoscopy Unit ? ?Anesthesia Type:General ? ?Level of Consciousness: awake ? ?Airway & Oxygen Therapy: Patient Spontanous Breathing ? ?Post-op Assessment: Report given to RN ? ?Post vital signs: stable ? ?Last Vitals:  ?Vitals Value Taken Time  ?BP    ?Temp    ?Pulse    ?Resp    ?SpO2    ? ? ?Last Pain:  ?Vitals:  ? 09/23/21 0803  ?TempSrc: Temporal  ?   ? ?  ? ?Complications: No notable events documented. ?

## 2021-09-23 NOTE — Op Note (Signed)
[facility] ?Gastroenterology ?Patient Name: Erica Valencia ?Procedure Date: 09/23/2021 9:13 AM ?MRN: [id number] ?Account #: [id number] ?Date of Birth: 02/28/1951 ?Admit Type: Outpatient ?Age: 71 ?Room: [facility] ENDO ROOM 1 ?Gender: Female ?Note Status: Finalized ?Instrument Name: Peds Colonoscope [id number] ?Procedure:             Colonoscopy ?Indications:           Screening for colorectal malignant neoplasm ?Providers:             Andrey Farmer MD, MD ?Referring MD:          Nino Glow [name] MD, MD (Referring MD) ?Medicines:             Monitored Anesthesia Care ?Complications:         No immediate complications. ?Procedure:             Pre-Anesthesia Assessment: ?                       - Prior to the procedure, a History and Physical was  ?                       performed, and patient medications and allergies were  ?                       reviewed. The patient is competent. The risks and  ?                       benefits of the procedure and the sedation options and  ?                       risks were discussed with the patient. All questions  ?                       were answered and informed consent was obtained.  ?                       Patient identification and proposed procedure were  ?                       verified by the physician, the nurse, the  ?                       anesthesiologist, the anesthetist and the technician  ?                       in the endoscopy suite. Mental Status Examination:  ?                       alert and oriented. Airway Examination: normal  ?                       oropharyngeal airway and neck mobility. Respiratory  ?                       Examination: clear to auscultation. CV Examination:  ?                       normal. Prophylactic Antibiotics: The patient does not  ?  require prophylactic antibiotics. Prior  ?                       Anticoagulants: The patient has taken no previous  ?                       anticoagulant or  antiplatelet agents. ASA Grade  ?                       Assessment: II - A patient with mild systemic disease.  ?                       After reviewing the risks and benefits, the patient  ?                       was deemed in satisfactory condition to undergo the  ?                       procedure. The anesthesia plan was to use monitored  ?                       anesthesia care (MAC). Immediately prior to  ?                       administration of medications, the patient was  ?                       re-assessed for adequacy to receive sedatives. The  ?                       heart rate, respiratory rate, oxygen saturations,  ?                       blood pressure, adequacy of pulmonary ventilation, and  ?                       response to care were monitored throughout the  ?                       procedure. The physical status of the patient was  ?                       re-assessed after the procedure. ?                       After obtaining informed consent, the colonoscope was  ?                       passed under direct vision. Throughout the procedure,  ?                       the patient's blood pressure, pulse, and oxygen  ?                       saturations were monitored continuously. The  ?                       Colonoscope was introduced through the anus and  ?  advanced to the the cecum, identified by appendiceal  ?                       orifice and ileocecal valve. The colonoscopy was  ?                       performed without difficulty. The patient tolerated  ?                       the procedure well. The quality of the bowel  ?                       preparation was good. ?Findings: ?     Hemorrhoids were found on perianal exam. ?     External and internal hemorrhoids were found during retroflexion and  ?     during digital exam. The hemorrhoids were Grade I (internal hemorrhoids  ?     that do not prolapse). ?     The exam was otherwise without abnormality on direct and  retroflexion  ?     views. ?Impression:            - Hemorrhoids found on perianal exam. ?                       - External and internal hemorrhoids. ?                       - The examination was otherwise normal on direct and  ?                       retroflexion views. ?                       - No specimens collected. ?Recommendation:        - Discharge patient to home. ?                       - Resume previous diet. ?                       - Continue present medications. ?                       - Repeat colonoscopy is not recommended due to current  ?                       age (50 years or older) for screening purposes. ?                       - Return to referring physician as previously  ?                       scheduled. ?Procedure Code(s):     --- Professional --- ?                       P3295, Colorectal cancer screening; colonoscopy on  ?                       individual not meeting criteria for high risk ?Diagnosis Code(s):     --- Professional --- ?  Z12.11, Encounter for screening for malignant neoplasm  ?                       of colon ?                       K64.0, First degree hemorrhoids ?CPT copyright 2019 American Medical Association. All rights reserved. ?The codes documented in this report are preliminary and upon coder review may  ?be revised to meet current compliance requirements. ?Andrey Farmer MD, MD ?09/23/2021 9:37:29 AM ?Number of Addenda: 0 ?Note Initiated On: 09/23/2021 9:13 AM ?Scope Withdrawal Time: 0 hours 9 minutes 42 seconds  ?Total Procedure Duration: 0 hours 15 minutes 47 seconds  ?Estimated Blood Loss:  Estimated blood loss: none. ?     [facility] ?

## 2021-09-23 NOTE — H&P (Signed)
Outpatient short stay form Pre-procedure ?09/23/2021  ?Regis Bill, MD ? ?Primary Physician: [name], [name], MD ? ?Reason for visit:  Screening ? ?History of present illness:   ? ?71 y/o lady with history of hypertension here for screening colonoscopy. Last colonoscopy over 10 years ago reportedly unremarkable. History of appendectomy. No blood thinners. No family history of GI malignancies. ? ? ? ?Current Facility-Administered Medications:  ?  0.9 %  sodium chloride infusion, , Intravenous, Continuous, Kaulana Brindle, Rossie Muskrat, MD, Last Rate: 20 mL/hr at 09/23/21 0815, New Bag at 09/23/21 0815 ? ?Medications Prior to Admission  ?Medication Sig Dispense Refill Last Dose  ? amLODipine (NORVASC) 2.5 MG tablet Take 1 tablet (2.5 mg total) by mouth 2 (two) times daily. 180 tablet 3 09/22/2021  ? metoprolol succinate (TOPROL-XL) 25 MG 24 hr tablet Take 0.5 tablets (12.5 mg total) by mouth in the morning and at bedtime. 45 tablet 3 09/22/2021  ? Multiple Vitamin (MULTIVITAMIN) tablet Take 1 tablet by mouth daily.   09/19/2021  ? ? ? ?Allergies  ?Allergen Reactions  ? Sulfa Antibiotics Itching  ?  Throat tight  ? Statins Other (See Comments)  ?  Intolerance, multiple side effects  ? Telmisartan   ?  Heart skipping beats, flank pain and back pain once stopped resolved and nightmares per pt  ?  ? Zetia [Ezetimibe]   ?  Back,kidney pain, muscle aches thighs  ? Penicillins Rash  ?  Did it involve swelling of the face/tongue/throat, SOB, or low BP? No ?Did it involve sudden or severe rash/hives, skin peeling, or any reaction on the inside of your mouth or nose? No ?Did you need to seek medical attention at a hospital or doctor's office? No ?When did it last happen?      childhood ?If all above answers are ?NO?, may proceed with cephalosporin use.  ? Rosuvastatin Anxiety and Other (See Comments)  ?  Confusion and sleepiness  ? ? ? ?Past Medical History:  ?Diagnosis Date  ? Allergy   ? Bronchitis   ? COVID-19   ? 09/30/20  ?  Hyperlipidemia   ? Hypertension   ? Scarlet fever   ? as child caused vision issues has to wear glasses now  ? Sleep apnea   ? Syncope   ? 09/30/20  ? ? ?Review of systems:  Otherwise negative.  ? ? ?Physical Exam ? ?Gen: Alert, oriented. Appears stated age.  ?HEENT: PERRLA. ?Lungs: No respiratory distress ?CV: RRR ?Abd: soft, benign, no masses ?Ext: No edema ? ? ? ?Planned procedures: Proceed with colonoscopy. The patient understands the nature of the planned procedure, indications, risks, alternatives and potential complications including but not limited to bleeding, infection, perforation, damage to internal organs and possible oversedation/side effects from anesthesia. The patient agrees and gives consent to proceed.  ?Please refer to procedure notes for findings, recommendations and patient disposition/instructions.  ? ? ? ?Regis Bill, MD ?[facility] ? ? ? ?  ? ?

## 2021-09-23 NOTE — Anesthesia Preprocedure Evaluation (Signed)
Anesthesia Evaluation  ?Patient identified by MRN, date of birth, ID band ?Patient awake ? ? ? ?Reviewed: ?Allergy & Precautions, H&P , NPO status , Patient's Chart, lab work & pertinent test results, reviewed documented beta blocker date and time  ? ?Airway ?Mallampati: II ? ? ?Neck ROM: full ? ? ? Dental ? ?(+) Poor Dentition ?  ?Pulmonary ?sleep apnea and Continuous Positive Airway Pressure Ventilation ,  ?  ?Pulmonary exam normal ? ? ? ? ? ? ? Cardiovascular ?Exercise Tolerance: Good ?hypertension, On Medications ?negative cardio ROS ?Normal cardiovascular exam ?Rhythm:regular Rate:Normal ? ? ?  ?Neuro/Psych ?negative neurological ROS ? negative psych ROS  ? GI/Hepatic ?negative GI ROS, Neg liver ROS,   ?Endo/Other  ?negative endocrine ROS ? Renal/GU ?negative Renal ROS  ?negative genitourinary ?  ?Musculoskeletal ? ? Abdominal ?  ?Peds ? Hematology ?negative hematology ROS ?(+)   ?Anesthesia Other Findings ?Past Medical History: ?No date: Allergy ?No date: Bronchitis ?No date: COVID-19 ?    Comment:  09/30/20 ?No date: Hyperlipidemia ?No date: Hypertension ?No date: Scarlet fever ?    Comment:  as child caused vision issues has to wear glasses now ?No date: Sleep apnea ?No date: Syncope ?    Comment:  09/30/20 ?Past Surgical History: ?No date: APPENDECTOMY ?    Comment:  age [age] ?No date: CARPAL TUNNEL RELEASE ?    Comment:  left 2008 ?No date: OTHER SURGICAL HISTORY ?    Comment:  left great toe fusion 2012  ?BMI   ? Body Mass Index: 26.26 kg/m?  ?  ? Reproductive/Obstetrics ?negative OB ROS ? ?  ? ? ? ? ? ? ? ? ? ? ? ? ? ?  ?  ? ? ? ? ? ? ? ? ?Anesthesia Physical ?Anesthesia Plan ? ?ASA: 3 ? ?Anesthesia Plan: General  ? ?Post-op Pain Management:   ? ?Induction:  ? ?PONV Risk Score and Plan:  ? ?Airway Management Planned:  ? ?Additional Equipment:  ? ?Intra-op Plan:  ? ?Post-operative Plan:  ? ?Informed Consent: I have reviewed the patients History and Physical, chart, labs  and discussed the procedure including the risks, benefits and alternatives for the proposed anesthesia with the patient or authorized representative who has indicated his/her understanding and acceptance.  ? ? ? ?Dental Advisory Given ? ?Plan Discussed with: CRNA ? ?Anesthesia Plan Comments:   ? ? ? ? ? ? ?Anesthesia Quick Evaluation ? ?

## 2021-09-24 ENCOUNTER — Encounter: Payer: Self-pay | Admitting: Gastroenterology

## 2021-10-02 NOTE — Anesthesia Postprocedure Evaluation (Signed)
Anesthesia Post Note  Patient: Erica Valencia  Procedure(s) Performed: COLONOSCOPY WITH PROPOFOL  Patient location during evaluation: PACU Anesthesia Type: General Level of consciousness: awake and alert Pain management: pain level controlled Vital Signs Assessment: post-procedure vital signs reviewed and stable Respiratory status: spontaneous breathing, nonlabored ventilation, respiratory function stable and patient connected to nasal cannula oxygen Cardiovascular status: blood pressure returned to baseline and stable Postop Assessment: no apparent nausea or vomiting Anesthetic complications: no   No notable events documented.   Last Vitals:  Vitals:   09/23/21 0948 09/23/21 0958  BP: 130/72 (!) 146/74  Pulse: 74 77  Resp: 15 15  Temp:    SpO2: 98% 99%    Last Pain:  Vitals:   09/23/21 0958  TempSrc:   PainSc: 0-No pain                 Molli Barrows

## 2021-10-07 ENCOUNTER — Telehealth: Payer: Self-pay | Admitting: Internal Medicine

## 2021-10-07 NOTE — Telephone Encounter (Signed)
Spoke with patient she declined AWV  

## 2021-11-19 ENCOUNTER — Ambulatory Visit: Payer: Medicare HMO | Admitting: Internal Medicine

## 2021-12-02 ENCOUNTER — Ambulatory Visit (INDEPENDENT_AMBULATORY_CARE_PROVIDER_SITE_OTHER): Payer: Medicare HMO | Admitting: Internal Medicine

## 2021-12-02 ENCOUNTER — Encounter: Payer: Self-pay | Admitting: Internal Medicine

## 2021-12-02 ENCOUNTER — Telehealth: Payer: Self-pay

## 2021-12-02 VITALS — BP 118/76 | HR 58 | Temp 97.5°F | Ht 59.0 in | Wt 133.2 lb

## 2021-12-02 DIAGNOSIS — L304 Erythema intertrigo: Secondary | ICD-10-CM

## 2021-12-02 DIAGNOSIS — E785 Hyperlipidemia, unspecified: Secondary | ICD-10-CM | POA: Diagnosis not present

## 2021-12-02 DIAGNOSIS — R7303 Prediabetes: Secondary | ICD-10-CM

## 2021-12-02 DIAGNOSIS — Z789 Other specified health status: Secondary | ICD-10-CM | POA: Diagnosis not present

## 2021-12-02 DIAGNOSIS — Z1231 Encounter for screening mammogram for malignant neoplasm of breast: Secondary | ICD-10-CM | POA: Diagnosis not present

## 2021-12-02 DIAGNOSIS — I10 Essential (primary) hypertension: Secondary | ICD-10-CM | POA: Diagnosis not present

## 2021-12-02 DIAGNOSIS — K649 Unspecified hemorrhoids: Secondary | ICD-10-CM | POA: Insufficient documentation

## 2021-12-02 DIAGNOSIS — R5383 Other fatigue: Secondary | ICD-10-CM | POA: Diagnosis not present

## 2021-12-02 DIAGNOSIS — Z1329 Encounter for screening for other suspected endocrine disorder: Secondary | ICD-10-CM

## 2021-12-02 LAB — CBC WITH DIFFERENTIAL/PLATELET
Basophils Absolute: 0 10*3/uL (ref 0.0–0.1)
Basophils Relative: 0.5 % (ref 0.0–3.0)
Eosinophils Absolute: 0.1 10*3/uL (ref 0.0–0.7)
Eosinophils Relative: 1.3 % (ref 0.0–5.0)
HCT: 42.5 % (ref 36.0–46.0)
Hemoglobin: 14.7 g/dL (ref 12.0–15.0)
Lymphocytes Relative: 32.3 % (ref 12.0–46.0)
Lymphs Abs: 1.9 10*3/uL (ref 0.7–4.0)
MCHC: 34.5 g/dL (ref 30.0–36.0)
MCV: 87.1 fl (ref 78.0–100.0)
Monocytes Absolute: 0.6 10*3/uL (ref 0.1–1.0)
Monocytes Relative: 10.8 % (ref 3.0–12.0)
Neutro Abs: 3.2 10*3/uL (ref 1.4–7.7)
Neutrophils Relative %: 55.1 % (ref 43.0–77.0)
Platelets: 256 10*3/uL (ref 150.0–400.0)
RBC: 4.88 Mil/uL (ref 3.87–5.11)
RDW: 12.7 % (ref 11.5–15.5)
WBC: 5.7 10*3/uL (ref 4.0–10.5)

## 2021-12-02 LAB — HEMOGLOBIN A1C: Hgb A1c MFr Bld: 5.7 % (ref 4.6–6.5)

## 2021-12-02 LAB — COMPREHENSIVE METABOLIC PANEL
ALT: 16 U/L (ref 0–35)
AST: 21 U/L (ref 0–37)
Albumin: 4.9 g/dL (ref 3.5–5.2)
Alkaline Phosphatase: 70 U/L (ref 39–117)
BUN: 15 mg/dL (ref 6–23)
CO2: 29 mEq/L (ref 19–32)
Calcium: 9.9 mg/dL (ref 8.4–10.5)
Chloride: 102 mEq/L (ref 96–112)
Creatinine, Ser: 0.68 mg/dL (ref 0.40–1.20)
GFR: 86.88 mL/min (ref >60.00)
Glucose, Bld: 92 mg/dL (ref 70–99)
Potassium: 3.9 mEq/L (ref 3.5–5.1)
Sodium: 140 mEq/L (ref 135–145)
Total Bilirubin: 0.7 mg/dL (ref 0.2–1.2)
Total Protein: 7.2 g/dL (ref 6.0–8.3)

## 2021-12-02 LAB — LIPID PANEL
Cholesterol: 223 mg/dL — ABNORMAL HIGH (ref 0–200)
HDL: 70.2 mg/dL (ref >39.00)
LDL Cholesterol: 134 mg/dL — ABNORMAL HIGH (ref 0–99)
NonHDL: 152.56
Total CHOL/HDL Ratio: 3
Triglycerides: 93 mg/dL (ref 0.0–149.0)
VLDL: 18.6 mg/dL (ref 0.0–40.0)

## 2021-12-02 LAB — TSH: TSH: 2.01 u[IU]/mL (ref 0.35–5.50)

## 2021-12-02 MED ORDER — AMLODIPINE BESYLATE 2.5 MG PO TABS: 2.5000 mg | ORAL_TABLET | Freq: Two times a day (BID) | ORAL | 3 refills | Status: DC

## 2021-12-02 MED ORDER — METOPROLOL SUCCINATE ER 25 MG PO TB24: 12.5000 mg | ORAL_TABLET | Freq: Two times a day (BID) | ORAL | 3 refills | Status: DC

## 2021-12-02 MED ORDER — CLOTRIMAZOLE 1 % EX CREA: 1.0000 | TOPICAL_CREAM | Freq: Two times a day (BID) | CUTANEOUS | 11 refills | Status: DC

## 2021-12-02 NOTE — Telephone Encounter (Signed)
Lvm for pt to return call in regards to lab results.  Per Dr.Tracy: Cholesterol stable but elevated goal total <200 and ldl <100  Wound she consider injections for cholesterol with cardiology?  Iiver kidneys normal  Blood cts normal  A1c=prediabtes  Thyroid lab normal

## 2021-12-02 NOTE — Progress Notes (Signed)
Chief Complaint  Patient presents with   Follow-up    6 month f/u with no other complaints   6 month f/u  1. Htn controlled on toprol xl 12.5 mg qd and norvasc 2.5 mg qd  2 intertrigo needs more clotrimazole     Review of Systems  Constitutional:  Negative for weight loss.  HENT:  Negative for hearing loss.   Eyes:  Negative for blurred vision.  Respiratory:  Negative for shortness of breath.   Cardiovascular:  Negative for chest pain.  Gastrointestinal:  Negative for abdominal pain and blood in stool.  Genitourinary:  Negative for dysuria.  Musculoskeletal:  Negative for falls and joint pain.  Skin:  Negative for rash.  Neurological:  Negative for headaches.  Psychiatric/Behavioral:  Negative for depression.    Past Medical History:  Diagnosis Date   Allergy    Bronchitis    COVID-19    09/30/20   Hemorrhoids    Hyperlipidemia    Hypertension    Scarlet fever    as child caused vision issues has to wear glasses now   Sleep apnea    Syncope    09/30/20   Past Surgical History:  Procedure Laterality Date   APPENDECTOMY     age [age]   [place]     left 2008   COLONOSCOPY WITH PROPOFOL N/A 09/23/2021   Procedure: COLONOSCOPY WITH PROPOFOL;  Surgeon: Lesly Rubenstein, MD;  Location: [facility] ENDOSCOPY;  Service: Endoscopy;  Laterality: N/A;   OTHER SURGICAL HISTORY     left great toe fusion 2012    Family History  Problem Relation Age of Onset   Other Mother        brain tumor died 47; heavy smoker    Heart attack Father        34s   Breast cancer Neg Hx    Social History   Socioeconomic History   Marital status: Married    Spouse name: Not on file   Number of children: Not on file   Years of education: Not on file   Highest education level: Not on file  Occupational History   Not on file  Tobacco Use   Smoking status: Never   Smokeless tobacco: Never  Vaping Use   Vaping Use: Never used  Substance and Sexual Activity   Alcohol use: No    Drug use: No   Sexual activity: Never    Partners: Male  Other Topics Concern   Not on file  Social History Narrative   Moved from [place] 2018   Mother was [place] she was born in [place] father American soldier never knew    Married    Retired from [employer] daily 20 minutes x 3 intervals    Social Determinants of Health   Financial Resource Strain: Low Risk  (12/04/2019)   Overall Financial Resource Strain (CARDIA)    Difficulty of Paying Living Expenses: Not hard at all  Food Insecurity: No Food Insecurity (12/04/2019)   Hunger Vital Sign    Worried About Running Out of Food in the Last Year: Never true    [place] in the Last Year: Never true  Transportation Needs: No Transportation Needs (12/04/2019)   PRAPARE - Hydrologist (Medical): No    Lack of Transportation (Non-Medical): No  Physical Activity: Sufficiently Active (12/04/2019)   Exercise Vital Sign    Days of Exercise per Week:  5 days    Minutes of Exercise per Session: 30 min  Stress: No Stress Concern Present (12/04/2019)   [place]    Feeling of Stress : Not at all  Social Connections: Unknown (12/04/2019)   Social Connection and Isolation Panel [NHANES]    Frequency of Communication with Friends and Family: Not on file    Frequency of Social Gatherings with Friends and Family: Not on file    Attends Religious Services: Not on file    Active Member of Clubs or Organizations: Not on file    Attends Archivist Meetings: Not on file    Marital Status: Married  Intimate Partner Violence: Not At Risk (12/04/2019)   Humiliation, Afraid, Rape, and Kick questionnaire    Fear of Current or Ex-Partner: No    Emotionally Abused: No    Physically Abused: No    Sexually Abused: No   Current Meds  Medication Sig   Multiple Vitamin (MULTIVITAMIN) tablet Take 1 tablet by mouth daily.    [DISCONTINUED] amLODipine (NORVASC) 2.5 MG tablet Take 1 tablet (2.5 mg total) by mouth 2 (two) times daily.   [DISCONTINUED] metoprolol succinate (TOPROL-XL) 25 MG 24 hr tablet Take 0.5 tablets (12.5 mg total) by mouth in the morning and at bedtime.   Allergies  Allergen Reactions   Sulfa Antibiotics Itching    Throat tight   Statins Other (See Comments)    Intolerance, multiple side effects   Telmisartan     Heart skipping beats, flank pain and back pain once stopped resolved and nightmares per pt     Zetia [Ezetimibe]     Back,kidney pain, muscle aches thighs   Penicillins Rash    Did it involve swelling of the face/tongue/throat, SOB, or low BP? No Did it involve sudden or severe rash/hives, skin peeling, or any reaction on the inside of your mouth or nose? No Did you need to seek medical attention at a hospital or doctor's office? No When did it last happen?      childhood If all above answers are "NO", may proceed with cephalosporin use.   Rosuvastatin Anxiety and Other (See Comments)    Confusion and sleepiness   No results found for this or any previous visit (from the past 2160 hour(s)). Objective  Body mass index is 26.9 kg/m. Wt Readings from Last 3 Encounters:  12/02/21 133 lb 3.2 oz (60.4 kg)  09/23/21 130 lb (59 kg)  06/09/21 133 lb 6 oz (60.5 kg)   Temp Readings from Last 3 Encounters:  12/02/21 (!) 97.5 F (36.4 C) (Oral)  09/23/21 (!) 96.5 F (35.8 C) (Temporal)  05/22/21 97.6 F (36.4 C) (Temporal)   BP Readings from Last 3 Encounters:  12/02/21 118/76  09/23/21 (!) 146/74  06/09/21 (!) 150/72   Pulse Readings from Last 3 Encounters:  12/02/21 (!) 58  09/23/21 77  06/09/21 62    Physical Exam Vitals and nursing note reviewed.  Constitutional:      Appearance: Normal appearance. She is well-developed and well-groomed.  HENT:     Head: Normocephalic and atraumatic.  Eyes:     Conjunctiva/sclera: Conjunctivae normal.     Pupils: Pupils are  equal, round, and reactive to light.  Cardiovascular:     Rate and Rhythm: Normal rate and regular rhythm.     Heart sounds: Normal heart sounds. No murmur heard. Pulmonary:     Effort: Pulmonary effort is normal.  Breath sounds: Normal breath sounds.  Abdominal:     General: Abdomen is flat. Bowel sounds are normal.     Tenderness: There is no abdominal tenderness.  Musculoskeletal:        General: No tenderness.  Skin:    General: Skin is warm and dry.  Neurological:     General: No focal deficit present.     Mental Status: She is alert and oriented to person, place, and time. Mental status is at baseline.     Cranial Nerves: Cranial nerves 2-12 are intact.     Motor: Motor function is intact.     Coordination: Coordination is intact.     Gait: Gait is intact.  Psychiatric:        Attention and Perception: Attention and perception normal.        Mood and Affect: Mood and affect normal.        Speech: Speech normal.        Behavior: Behavior normal. Behavior is cooperative.        Thought Content: Thought content normal.        Cognition and Memory: Cognition and memory normal.        Judgment: Judgment normal.     Assessment  Plan  Essential hypertension - Plan: metoprolol succinate (TOPROL-XL) 25 MG 24 hr tablet, amLODipine (NORVASC) 2.5 MG tablet qd mostly to bid, Lipid panel, Comprehensive metabolic panel, CBC with Differential/Platelet   Intertrigo - Plan: clotrimazole (LOTRIMIN) 1 % cream   HM Flu wait 2021  -[facility]/18/2018 their system only goes back 18 months and pna 23 not in system consider in future   -04/17/20 declines   Consider prevnar 20 in the future declines today prevnar had 05/17/14 brought proof of vx  Tdap 09/30/20 zostervax had 2015 broke out with shingles has not had shingrix and declines another shingles vaccine had rash with 1st, declines shingrix   moderna 2/2 declines further   Last pap 11/28/15 Dr. Pringle neg  pap +atrophy HPV neg    Last colonoscopy 2015 nl per pt need to get records per pt they rec in 10 years no h/o polyps she has had colonoscopy x 2  -Dr. Wazaz [facility] prior -referred for colonoscopy 09/23/21 negative    Referred for mammogram 09/02/21 negative ordered 09/03/22    My eye doctor seen 08/2019   DEXA 01/24/19 -2.9 osteoporosis hold prolia declines for now Repeat DEXA ordered 05/22/21 09/02/21 hip frax -4.1 and T score -2.4    Borderline osteoporosis but hip fracture risk score is high >3%  Does she want to see endocrine to discuss medication management of this?  As of 08/2021 decline endocrine referral                   Disc hep B/C/MMR testing pt declines for now No skin issues    rec healthy diet and exercise  Provider: Dr. Tracy [name]-Internal Medicine  

## 2021-12-02 NOTE — Patient Instructions (Signed)
Consider prevnar 20  Pneumococcal Conjugate Vaccine (Prevnar 20) Suspension for Injection What is this medication? PNEUMOCOCCAL VACCINE (NEU mo KOK al vak SEEN) is a vaccine. It prevents pneumococcus bacterial infections. These bacteria can cause serious infections like pneumonia, meningitis, and blood infections. This vaccine will not treat an infection and will not cause infection. This vaccine is recommended for adults 18 years and older. This medicine may be used for other purposes; ask your health care provider or pharmacist if you have questions. COMMON BRAND NAME(S): Prevnar 20 What should I tell my care team before I take this medication? They need to know if you have any of these conditions: bleeding disorder fever immune system problems an unusual or allergic reaction to pneumococcal vaccine, diphtheria toxoid, other vaccines, other medicines, foods, dyes, or preservatives pregnant or trying to get pregnant breast-feeding How should I use this medication? This vaccine is injected into a muscle. It is given by a health care provider. A copy of Vaccine Information Statements will be given before each vaccination. Be sure to read this information carefully each time. This sheet may change often. Talk to your health care provider about the use of this medicine in children. Special care may be needed. Overdosage: If you think you have taken too much of this medicine contact a poison control center or emergency room at once. NOTE: This medicine is only for you. Do not share this medicine with others. What if I miss a dose? This does not apply. This medicine is not for regular use. What may interact with this medication? medicines for cancer chemotherapy medicines that suppress your immune function steroid medicines like prednisone or cortisone This list may not describe all possible interactions. Give your health care provider a list of all the medicines, herbs, non-prescription drugs,  or dietary supplements you use. Also tell them if you smoke, drink alcohol, or use illegal drugs. Some items may interact with your medicine. What should I watch for while using this medication? Mild fever and pain should go away in 3 days or less. Report any unusual symptoms to your health care provider. What side effects may I notice from receiving this medication? Side effects that you should report to your doctor or health care professional as soon as possible: allergic reactions (skin rash, itching or hives; swelling of the face, lips, or tongue) confusion fast, irregular heartbeat fever over 102 degrees F muscle weakness seizures trouble breathing unusual bruising or bleeding Side effects that usually do not require medical attention (report to your doctor or health care professional if they continue or are bothersome): fever of 102 degrees F or less headache joint pain muscle cramps, pain pain, tender at site where injected This list may not describe all possible side effects. Call your doctor for medical advice about side effects. You may report side effects to FDA at 1-800-FDA-1088. Where should I keep my medication? This vaccine is only given by a health care provider. It will not be stored at home. NOTE: This sheet is a summary. It may not cover all possible information. If you have questions about this medicine, talk to your doctor, pharmacist, or health care provider.  2023 Elsevier/Gold Standard (2020-01-18 00:00:00)

## 2022-02-06 ENCOUNTER — Other Ambulatory Visit: Payer: Self-pay | Admitting: Internal Medicine

## 2022-02-06 DIAGNOSIS — L304 Erythema intertrigo: Secondary | ICD-10-CM

## 2022-02-06 MED ORDER — MUPIROCIN 2 % EX OINT: 1.0000 | TOPICAL_OINTMENT | Freq: Two times a day (BID) | CUTANEOUS | 0 refills | Status: DC

## 2022-03-12 ENCOUNTER — Encounter: Payer: Self-pay | Admitting: Internal Medicine

## 2022-04-01 ENCOUNTER — Encounter: Payer: Medicare HMO | Admitting: Family Medicine

## 2022-06-22 ENCOUNTER — Telehealth: Payer: Self-pay | Admitting: Family Medicine

## 2022-06-22 DIAGNOSIS — I10 Essential (primary) hypertension: Secondary | ICD-10-CM

## 2022-06-22 MED ORDER — METOPROLOL SUCCINATE ER 25 MG PO TB24: 12.5000 mg | ORAL_TABLET | Freq: Two times a day (BID) | ORAL | 0 refills | Status: DC

## 2022-06-22 NOTE — Telephone Encounter (Signed)
Prescription Request  06/22/2022  Is this a "Controlled Substance" medicine? No  LOV: Visit date not found  What is the name of the medication or equipment? metoprolol succinate (TOPROL-XL) 25 MG 24 hr tablet  Have you contacted your pharmacy to request a refill? Yes   Which pharmacy would you like this sent to?   [id number] [place] [address] [place] [address] Phone: [phone] Fax: [phone]    Patient notified that their request is being sent to the clinical staff for review and that they should receive a response within 2 business days.   Please advise at Mobile [phone] (mobile)   Pt has a TOC with Volanda Napoleon 3/19.

## 2022-06-22 NOTE — Telephone Encounter (Signed)
Enough sent to cover until 3-19 appt with Volanda Napoleon

## 2022-07-01 DIAGNOSIS — I10 Essential (primary) hypertension: Secondary | ICD-10-CM | POA: Diagnosis not present

## 2022-07-01 DIAGNOSIS — Z0000 Encounter for general adult medical examination without abnormal findings: Secondary | ICD-10-CM | POA: Diagnosis not present

## 2022-07-01 DIAGNOSIS — Z1331 Encounter for screening for depression: Secondary | ICD-10-CM | POA: Diagnosis not present

## 2022-08-04 ENCOUNTER — Ambulatory Visit (INDEPENDENT_AMBULATORY_CARE_PROVIDER_SITE_OTHER): Payer: Medicare HMO | Admitting: Family Medicine

## 2022-08-04 ENCOUNTER — Encounter: Payer: Self-pay | Admitting: Family Medicine

## 2022-08-04 VITALS — BP 115/78 | HR 60 | Temp 97.8°F

## 2022-08-04 DIAGNOSIS — Z789 Other specified health status: Secondary | ICD-10-CM | POA: Diagnosis not present

## 2022-08-04 DIAGNOSIS — M810 Age-related osteoporosis without current pathological fracture: Secondary | ICD-10-CM | POA: Diagnosis not present

## 2022-08-04 DIAGNOSIS — I10 Essential (primary) hypertension: Secondary | ICD-10-CM

## 2022-08-04 DIAGNOSIS — E782 Mixed hyperlipidemia: Secondary | ICD-10-CM

## 2022-08-04 DIAGNOSIS — R7303 Prediabetes: Secondary | ICD-10-CM | POA: Diagnosis not present

## 2022-08-04 MED ORDER — SACCHAROMYCES BOULARDII 250 MG PO CAPS: 250.0000 mg | ORAL_CAPSULE | Freq: Every day | ORAL | 0 refills | Status: DC

## 2022-08-04 MED ORDER — METOPROLOL SUCCINATE ER 25 MG PO TB24: 12.5000 mg | ORAL_TABLET | Freq: Every day | ORAL | 0 refills | Status: DC

## 2022-08-04 NOTE — Progress Notes (Signed)
   SUBJECTIVE:   Chief Complaint  Patient presents with   Transitions Of Care   HPI Patient presents to clinic to transfer care.  No acute concerns.  Hypertension Asymptomatic.  Currently on Norvasc 2.5 mg daily and metoprolol XL 12.5 mg BID.  Tolerating well. Blood pressure has been soft at home.    PERTINENT PMH / PSH: Hypertension OSA on CPAP   OBJECTIVE:  BP 115/78   Pulse 60   Temp 97.8 F (36.6 C) (Oral)   SpO2 97%    Physical Exam Constitutional:      General: She is not in acute distress.    Appearance: She is normal weight. She is not ill-appearing.  HENT:     Head: Normocephalic.  Eyes:     Conjunctiva/sclera: Conjunctivae normal.  Neck:     Thyroid: No thyromegaly or thyroid tenderness.  Cardiovascular:     Rate and Rhythm: Normal rate and regular rhythm.     Pulses: Normal pulses.  Pulmonary:     Effort: Pulmonary effort is normal.     Breath sounds: Normal breath sounds.  Abdominal:     General: Bowel sounds are normal.  Neurological:     Mental Status: She is alert. Mental status is at baseline.  Psychiatric:        Mood and Affect: Mood normal.        Behavior: Behavior normal.        Thought Content: Thought content normal.        Judgment: Judgment normal.     ASSESSMENT/PLAN:  Essential hypertension Assessment & Plan: Chronic.  Stable.  Soft blood pressures. Continue amlodipine 2.5 mg daily Decrease metoprolol to 12.5 mg nightly. Follow-up in 4 weeks. Strict return precautions provided.  Orders: -     CBC with Differential/Platelet; Future -     Comprehensive metabolic panel; Future -     Lipid panel; Future -     TSH; Future -     Vitamin B12; Future -     VITAMIN D 25 Hydroxy (Vit-D Deficiency, Fractures); Future -     Metoprolol Succinate ER; Take 0.5 tablets (12.5 mg total) by mouth at bedtime.  Dispense: 44 tablet; Refill: 0  Prediabetes Assessment & Plan: Most recent A1c 5.7. Check A1c yearly   Orders: -      Hemoglobin A1c; Future -     Saccharomyces boulardii; Take 1 capsule (250 mg total) by mouth daily.  Dispense: 90 capsule; Refill: 0  Osteoporosis, unspecified osteoporosis type, unspecified pathological fracture presence Assessment & Plan: Chronic.  Previously on Prolia, not currently on calcium or bisphosphonate treatment. Plan to discuss with patient at next visit.      Mixed hyperlipidemia Assessment & Plan: Chronic. Check fasting lipids   Statin intolerance Assessment & Plan: Chronic.  Previously tried statin therapy and reports body aches, muscle and joint pain.    HCM Medicare annual wellness completed at [facility] on 07/01/2022.  PDMP reviewed  Return in about 4 weeks (around 09/01/2022) for PCP follow-up BP.  Carollee Leitz, MD

## 2022-08-04 NOTE — Patient Instructions (Addendum)
It was a pleasure meeting you today. Thank you for allowing me to take part in your health care.  Our goals for today as we discussed include:  Decrease metoprolol Take 12.5 mg of metoprolol at night Continue amlodipine 2.5 mg 2 times a day Monitor your blood pressure at home, goal less than 130/80 Monitor heart rate, goal less than 100  Schedule appointment with PCP for follow-up in 4 weeks.  Schedule annual physical in 3 to 4 months Schedule lab appointment 1 week prior to annual visit.  Please fast for 10 hours  Schedule Medicare annual wellness Tuesday/Thursday afternoon  Can start probiotics daily to help with constipation  If you have any questions or concerns, please do not hesitate to call the office at [phone].  I look forward to our next visit and until then take care and stay safe.  Regards,   Carollee Leitz, MD   [facility]

## 2022-08-08 ENCOUNTER — Encounter: Payer: Self-pay | Admitting: Family Medicine

## 2022-08-08 NOTE — Assessment & Plan Note (Signed)
Chronic.  Previously tried statin therapy and reports body aches, muscle and joint pain.

## 2022-08-08 NOTE — Assessment & Plan Note (Signed)
Chronic. Check fasting lipids

## 2022-08-08 NOTE — Assessment & Plan Note (Signed)
Most recent A1c 5.7. Check A1c yearly

## 2022-08-08 NOTE — Assessment & Plan Note (Signed)
Chronic.  Previously on Prolia, not currently on calcium or bisphosphonate treatment. Plan to discuss with patient at next visit.

## 2022-08-08 NOTE — Assessment & Plan Note (Addendum)
Chronic.  Stable.  Soft blood pressures. Continue amlodipine 2.5 mg daily Decrease metoprolol to 12.5 mg nightly. Follow-up in 4 weeks. Strict return precautions provided.

## 2022-09-02 ENCOUNTER — Encounter: Payer: Self-pay | Admitting: Family Medicine

## 2022-09-02 ENCOUNTER — Ambulatory Visit (INDEPENDENT_AMBULATORY_CARE_PROVIDER_SITE_OTHER): Payer: Medicare HMO | Admitting: Family Medicine

## 2022-09-02 VITALS — BP 126/70 | HR 62 | Temp 98.1°F | Resp 16 | Ht 59.0 in | Wt 133.4 lb

## 2022-09-02 DIAGNOSIS — I10 Essential (primary) hypertension: Secondary | ICD-10-CM

## 2022-09-02 MED ORDER — METOPROLOL SUCCINATE ER 25 MG PO TB24: 25.0000 mg | ORAL_TABLET | Freq: Every day | ORAL | 3 refills | Status: DC

## 2022-09-02 NOTE — Progress Notes (Signed)
   SUBJECTIVE:   Chief Complaint  Patient presents with   Hypertension   HPI Patient presents to clinic for follow-up chronic disease management  Hypertension Asymptomatic.  Compliant with Norvasc 2.5 mg twice daily and metoprolol XL 12.5 mg at bedtime.  Metoprolol has been decreased from 25 mg to 12.5 mg at last visit.  Reports blood pressure at home elevated in the morning and throughout the day.  Sometimes systolic 140s-150s.  Denies any visual changes, headaches, chest pain or shortness of breath and no lower extremity edema with elevated blood pressure.  Does not check blood pressure after activity.    PERTINENT PMH / PSH: Hypertension  OBJECTIVE:  BP 126/70   Pulse 62   Temp 98.1 F (36.7 C)   Resp 16   Ht 4\' 11"  (1.499 m)   Wt 133 lb 6 oz (60.5 kg)   SpO2 96%   BMI 26.94 kg/m    Physical Exam Vitals reviewed.  Constitutional:      General: She is not in acute distress.    Appearance: Normal appearance. She is not ill-appearing, toxic-appearing or diaphoretic.  Eyes:     General:        Right eye: No discharge.        Left eye: No discharge.     Conjunctiva/sclera: Conjunctivae normal.  Cardiovascular:     Rate and Rhythm: Normal rate and regular rhythm.     Heart sounds: Normal heart sounds.  Pulmonary:     Effort: Pulmonary effort is normal.     Breath sounds: Normal breath sounds.  Abdominal:     General: Bowel sounds are normal.  Musculoskeletal:        General: Normal range of motion.  Skin:    General: Skin is warm and dry.  Neurological:     General: No focal deficit present.     Mental Status: She is alert and oriented to person, place, and time. Mental status is at baseline.  Psychiatric:        Mood and Affect: Mood normal.        Behavior: Behavior normal.        Thought Content: Thought content normal.        Judgment: Judgment normal.     ASSESSMENT/PLAN:  Essential hypertension Assessment & Plan: Chronic.  Stable.  Soft blood  pressures. Switch amlodipine to 5 mg nightly Increase metoprolol XL to 25 mg nightly. Monitor blood pressure at home, if greater than 130/80 patient to schedule appointment MD prior to next visit Follow-up in 4 months Strict return precautions provided.  Orders: -     Metoprolol Succinate ER; Take 1 tablet (25 mg total) by mouth at bedtime.  Dispense: 90 tablet; Refill: 3 -     amLODIPine Besylate; Take 1 tablet (5 mg total) by mouth 2 (two) times daily. If BP >140/>90 take another dose   PDMP reviewed  Return in about 4 months (around 01/02/2023) for PCP.  Dana Allan, MD

## 2022-09-02 NOTE — Patient Instructions (Addendum)
It was a pleasure meeting you today. Thank you for allowing me to take part in your health care.  Our goals for today as we discussed include:  Increase Metoprolol to 25 mg at night Continue Amlodipine 2.5 mg twice a day  Can try to decrease amlodipine to evening dose. Take dose tonight at 11 p Tomorrow take Amlodipine 2.5 mg at 3-4 p Friday night take the Amlodipine 2.5 mg   Monitor blood pressure. If elevates greater than 130/80 resume Amlodipine 2.5 mg two times a day  Schedule appointment for fasting blood work in 3-4  months Schedule follow up in 3-4 months  If you have any questions or concerns, please do not hesitate to call the office at [phone].  I look forward to our next visit and until then take care and stay safe.  Regards,   Dana Allan, MD   [facility]

## 2022-09-10 DIAGNOSIS — S99922A Unspecified injury of left foot, initial encounter: Secondary | ICD-10-CM | POA: Diagnosis not present

## 2022-09-10 DIAGNOSIS — S99912A Unspecified injury of left ankle, initial encounter: Secondary | ICD-10-CM | POA: Diagnosis not present

## 2022-09-10 DIAGNOSIS — M7989 Other specified soft tissue disorders: Secondary | ICD-10-CM | POA: Diagnosis not present

## 2022-09-12 ENCOUNTER — Encounter: Payer: Self-pay | Admitting: Family Medicine

## 2022-09-12 MED ORDER — AMLODIPINE BESYLATE 5 MG PO TABS: 5.0000 mg | ORAL_TABLET | Freq: Two times a day (BID) | ORAL | Status: DC

## 2022-09-12 NOTE — Assessment & Plan Note (Addendum)
Chronic.  Stable.  Soft blood pressures. Switch amlodipine to 5 mg nightly Increase metoprolol XL to 25 mg nightly. Monitor blood pressure at home, if greater than 130/80 patient to schedule appointment MD prior to next visit Follow-up in 4 months Strict return precautions provided.

## 2022-09-23 ENCOUNTER — Telehealth: Payer: Self-pay | Admitting: Family Medicine

## 2022-09-23 NOTE — Telephone Encounter (Signed)
Contacted [name] to schedule their annual wellness visit. Patient declined to schedule AWV at this time. STATED SHE WILL THINK ABOUT  Verlee Rossetti; Care Guide Ambulatory Clinical Support  l [facility] Direct Dial: [phone]

## 2022-12-16 ENCOUNTER — Encounter (INDEPENDENT_AMBULATORY_CARE_PROVIDER_SITE_OTHER): Payer: Self-pay

## 2022-12-23 DIAGNOSIS — H35033 Hypertensive retinopathy, bilateral: Secondary | ICD-10-CM | POA: Diagnosis not present

## 2022-12-23 DIAGNOSIS — H4423 Degenerative myopia, bilateral: Secondary | ICD-10-CM | POA: Diagnosis not present

## 2022-12-23 DIAGNOSIS — H04123 Dry eye syndrome of bilateral lacrimal glands: Secondary | ICD-10-CM | POA: Diagnosis not present

## 2022-12-23 DIAGNOSIS — H524 Presbyopia: Secondary | ICD-10-CM | POA: Diagnosis not present

## 2022-12-31 ENCOUNTER — Other Ambulatory Visit (INDEPENDENT_AMBULATORY_CARE_PROVIDER_SITE_OTHER): Payer: Medicare HMO

## 2022-12-31 DIAGNOSIS — I10 Essential (primary) hypertension: Secondary | ICD-10-CM

## 2022-12-31 DIAGNOSIS — R7303 Prediabetes: Secondary | ICD-10-CM | POA: Diagnosis not present

## 2022-12-31 LAB — CBC WITH DIFFERENTIAL/PLATELET
Basophils Absolute: 0 10*3/uL (ref 0.0–0.1)
Basophils Relative: 0.5 % (ref 0.0–3.0)
Eosinophils Absolute: 0.1 10*3/uL (ref 0.0–0.7)
Eosinophils Relative: 1.2 % (ref 0.0–5.0)
HCT: 42 % (ref 36.0–46.0)
Hemoglobin: 14.1 g/dL (ref 12.0–15.0)
Lymphocytes Relative: 22.4 % (ref 12.0–46.0)
Lymphs Abs: 1.3 10*3/uL (ref 0.7–4.0)
MCHC: 33.6 g/dL (ref 30.0–36.0)
MCV: 86.8 fl (ref 78.0–100.0)
Monocytes Absolute: 0.5 10*3/uL (ref 0.1–1.0)
Monocytes Relative: 9.2 % (ref 3.0–12.0)
Neutro Abs: 3.9 10*3/uL (ref 1.4–7.7)
Neutrophils Relative %: 66.7 % (ref 43.0–77.0)
Platelets: 275 10*3/uL (ref 150.0–400.0)
RBC: 4.84 Mil/uL (ref 3.87–5.11)
RDW: 13.2 % (ref 11.5–15.5)
WBC: 5.9 10*3/uL (ref 4.0–10.5)

## 2022-12-31 LAB — COMPREHENSIVE METABOLIC PANEL
ALT: 16 U/L (ref 0–35)
AST: 20 U/L (ref 0–37)
Albumin: 4.4 g/dL (ref 3.5–5.2)
Alkaline Phosphatase: 67 U/L (ref 39–117)
BUN: 10 mg/dL (ref 6–23)
CO2: 29 mEq/L (ref 19–32)
Calcium: 9.6 mg/dL (ref 8.4–10.5)
Chloride: 102 mEq/L (ref 96–112)
Creatinine, Ser: 0.63 mg/dL (ref 0.40–1.20)
GFR: 87.83 mL/min (ref >60.00)
Glucose, Bld: 90 mg/dL (ref 70–99)
Potassium: 4.3 mEq/L (ref 3.5–5.1)
Sodium: 139 mEq/L (ref 135–145)
Total Bilirubin: 0.6 mg/dL (ref 0.2–1.2)
Total Protein: 6.7 g/dL (ref 6.0–8.3)

## 2022-12-31 LAB — LIPID PANEL
Cholesterol: 194 mg/dL (ref 0–200)
HDL: 62.1 mg/dL (ref >39.00)
LDL Cholesterol: 110 mg/dL — ABNORMAL HIGH (ref 0–99)
NonHDL: 131.96
Total CHOL/HDL Ratio: 3
Triglycerides: 111 mg/dL (ref 0.0–149.0)
VLDL: 22.2 mg/dL (ref 0.0–40.0)

## 2022-12-31 LAB — HEMOGLOBIN A1C: Hgb A1c MFr Bld: 5.8 % (ref 4.6–6.5)

## 2022-12-31 LAB — VITAMIN B12: Vitamin B-12: >1501 pg/mL — ABNORMAL HIGH (ref 211–911)

## 2022-12-31 LAB — TSH: TSH: 1.46 u[IU]/mL (ref 0.35–5.50)

## 2022-12-31 LAB — VITAMIN D 25 HYDROXY (VIT D DEFICIENCY, FRACTURES): VITD: 43.76 ng/mL (ref 30.00–100.00)

## 2023-01-04 ENCOUNTER — Ambulatory Visit (INDEPENDENT_AMBULATORY_CARE_PROVIDER_SITE_OTHER): Payer: Medicare HMO | Admitting: Family Medicine

## 2023-01-04 ENCOUNTER — Encounter: Payer: Self-pay | Admitting: Family Medicine

## 2023-01-04 ENCOUNTER — Telehealth: Payer: Self-pay | Admitting: Family Medicine

## 2023-01-04 VITALS — BP 124/64 | HR 96 | Temp 97.8°F | Resp 16 | Ht 59.0 in | Wt 128.4 lb

## 2023-01-04 DIAGNOSIS — Z0000 Encounter for general adult medical examination without abnormal findings: Secondary | ICD-10-CM | POA: Diagnosis not present

## 2023-01-04 DIAGNOSIS — I10 Essential (primary) hypertension: Secondary | ICD-10-CM

## 2023-01-04 MED ORDER — AMLODIPINE BESYLATE 5 MG PO TABS
5.0000 mg | ORAL_TABLET | Freq: Every day | ORAL | Status: DC
Start: 2023-01-04 — End: 2023-01-04

## 2023-01-04 MED ORDER — AMLODIPINE BESYLATE 2.5 MG PO TABS
2.5000 mg | ORAL_TABLET | Freq: Every day | ORAL | Status: DC
Start: 2023-01-04 — End: 2023-06-08

## 2023-01-04 NOTE — Patient Instructions (Addendum)
It was a pleasure meeting you today. Thank you for allowing me to take part in your health care.  Our goals for today as we discussed include:  Continue current medication  Stop Vitamin B 12 as levels are high Can resume in 3-4 months but take 1-2 times weekly  Follow up as needed   If you have any questions or concerns, please do not hesitate to call the office at [phone].  I look forward to our next visit and until then take care and stay safe.  Regards,   Dana Allan, MD   [facility]

## 2023-01-04 NOTE — Telephone Encounter (Signed)
Patient called to let Dr. Clent Ridges know how many MG she is taking in her medication. She said 1 tablet 2.5 mg by mouth twice daily. Her number is [phone] if you have any questions.

## 2023-01-04 NOTE — Telephone Encounter (Signed)
I have updated on her medication list. She wants to know when she needs to come back for her CPE.

## 2023-01-04 NOTE — Progress Notes (Signed)
SUBJECTIVE:   Chief Complaint  Patient presents with   Medical Management of Chronic Issues   HPI Patient presents to clinic for  annual physical and blood pressure follow-up   Hypertension Asymptomatic.  Compliant with Norvasc 2.5 mg twice daily and metoprolol XL 12.5 mg at bedtime. Denies any visual changes, headaches, chest pain or shortness of breath and no lower extremity edema with elevated blood pressure.  Does not check blood pressure after activity.     Denies SI/HI Denies any recent falls  Review of Systems - General ROS: negative    PERTINENT PMH / PSH: Hypertension  OBJECTIVE:  BP 124/64   Pulse 96   Temp 97.8 F (36.6 C)   Resp 16   Ht 4\' 11"  (1.499 m)   Wt 128 lb 6 oz (58.2 kg)   SpO2 96%   BMI 25.93 kg/m    Physical Exam Vitals reviewed.  Constitutional:      General: She is not in acute distress.    Appearance: Normal appearance. She is not ill-appearing, toxic-appearing or diaphoretic.  Eyes:     General:        Right eye: No discharge.        Left eye: No discharge.     Conjunctiva/sclera: Conjunctivae normal.  Cardiovascular:     Rate and Rhythm: Normal rate and regular rhythm.     Heart sounds: Normal heart sounds.  Pulmonary:     Effort: Pulmonary effort is normal.     Breath sounds: Normal breath sounds.  Abdominal:     General: Bowel sounds are normal.  Musculoskeletal:        General: Normal range of motion.  Skin:    General: Skin is warm and dry.  Neurological:     General: No focal deficit present.     Mental Status: She is alert and oriented to person, place, and time. Mental status is at baseline.  Psychiatric:        Mood and Affect: Mood normal.        Behavior: Behavior normal.        Thought Content: Thought content normal.        Judgment: Judgment normal.       01/04/2023    1:32 PM 09/02/2022    3:31 PM 08/04/2022    3:18 PM 12/02/2021    8:25 AM 05/22/2021    1:27 PM  Depression screen PHQ 2/9  Decreased  Interest 0 0 0 0 0  Down, Depressed, Hopeless 0 0 0 0 0  PHQ - 2 Score 0 0 0 0 0  Altered sleeping 0 0     Tired, decreased energy 0 0     Change in appetite 0 0     Feeling bad or failure about yourself  0 0     Trouble concentrating 0 0     Moving slowly or fidgety/restless 0 0     Suicidal thoughts 0 0     PHQ-9 Score 0 0     Difficult doing work/chores Not difficult at all Not difficult at all          01/04/2023    1:32 PM 09/02/2022    3:31 PM 08/04/2022    3:19 PM 11/17/2019   10:07 AM  GAD 7 : Generalized Anxiety Score  Nervous, Anxious, on Edge 0 0 0 0  Control/stop worrying 0 0 0 0  Worry too much - different things 0 0 0 0  Trouble relaxing  0 0 0 0  Restless 0 0 0 0  Easily annoyed or irritable 0 0 0 0  Afraid - awful might happen 0 0 0 0  Total GAD 7 Score 0 0 0 0  Anxiety Difficulty Not difficult at all Not difficult at all Not difficult at all Not difficult at all      ASSESSMENT/PLAN:  Annual physical exam Assessment & Plan: Mammogram up to date. Due 2025 Recommend regular self breast exam Colonoscopy up to date.  Due 2033 Declined PNA, Shingles vaccines Dexa completed PHQ9/ GAD screening completed   Essential hypertension Assessment & Plan: Chronic.  Stable.  Soft blood pressures. Refill Amlodipine to 5 mg nightly Continue metoprolol XL to 25 mg nightly. Monitor blood pressure at home   Orders: -     amLODIPine Besylate; Take 1 tablet (2.5 mg total) by mouth daily.      PDMP reviewed  Return if symptoms worsen or fail to improve, for PCP.  Dana Allan, MD

## 2023-01-19 ENCOUNTER — Encounter: Payer: Self-pay | Admitting: Family Medicine

## 2023-01-19 NOTE — Assessment & Plan Note (Signed)
Mammogram up to date. Due 2025 Recommend regular self breast exam Colonoscopy up to date.  Due 2033 Declined PNA, Shingles vaccines Dexa completed PHQ9/ GAD screening completed

## 2023-01-19 NOTE — Assessment & Plan Note (Signed)
Chronic.  Stable.  Soft blood pressures. Refill Amlodipine to 5 mg nightly Continue metoprolol XL to 25 mg nightly. Monitor blood pressure at home

## 2023-02-19 DIAGNOSIS — Z0100 Encounter for examination of eyes and vision without abnormal findings: Secondary | ICD-10-CM | POA: Diagnosis not present

## 2023-03-24 ENCOUNTER — Ambulatory Visit: Payer: Medicare HMO | Attending: Nurse Practitioner | Admitting: Nurse Practitioner

## 2023-03-24 ENCOUNTER — Encounter: Payer: Self-pay | Admitting: Nurse Practitioner

## 2023-03-24 VITALS — BP 168/82 | HR 60 | Ht 59.0 in | Wt 128.8 lb

## 2023-03-24 DIAGNOSIS — E782 Mixed hyperlipidemia: Secondary | ICD-10-CM

## 2023-03-24 DIAGNOSIS — I10 Essential (primary) hypertension: Secondary | ICD-10-CM | POA: Diagnosis not present

## 2023-03-24 DIAGNOSIS — G4733 Obstructive sleep apnea (adult) (pediatric): Secondary | ICD-10-CM

## 2023-03-24 DIAGNOSIS — I2510 Atherosclerotic heart disease of native coronary artery without angina pectoris: Secondary | ICD-10-CM | POA: Diagnosis not present

## 2023-03-24 MED ORDER — ASPIRIN 81 MG PO TBEC: 81.0000 mg | DELAYED_RELEASE_TABLET | Freq: Every day | ORAL | Status: AC

## 2023-03-24 NOTE — Patient Instructions (Signed)
Medication Instructions:  START Aspirin 81 mg once daily  *If you need a refill on your cardiac medications before your next appointment, please call your pharmacy*   Lab Work: None ordered If you have labs (blood work) drawn today and your tests are completely normal, you will receive your results only by: MyChart Message (if you have MyChart) OR A paper copy in the mail If you have any lab test that is abnormal or we need to change your treatment, we will call you to review the results.   Testing/Procedures: None ordered   Follow-Up: At [facility], you and your health needs are our priority.  As part of our continuing mission to provide you with exceptional heart care, we have created designated Provider Care Teams.  These Care Teams include your primary Cardiologist (physician) and Advanced Practice Providers (APPs -  Physician Assistants and Nurse Practitioners) who all work together to provide you with the care you need, when you need it.  We recommend signing up for the patient portal called "MyChart".  Sign up information is provided on this After Visit Summary.  MyChart is used to connect with patients for Virtual Visits (Telemedicine).  Patients are able to view lab/test results, encounter notes, upcoming appointments, etc.  Non-urgent messages can be sent to your provider as well.   To learn more about what you can do with MyChart, go to [email or web address].    Your next appointment:   12 month(s)  Provider:   You may see Julien Nordmann, MD or one of the following Advanced Practice Providers on your designated Care Team:   Nicolasa Ducking, NP  Other Instructions We would like you to check your blood pressure daily for the next 2 weeks.  Keep a journal of these daily blood pressure and heart rate readings and send a message through MyChart with the results. Thank you!  It is best to check your BP 1-2 hours after taking your medications to see the  medications effectiveness on your BP.    Here are some tips that our clinical pharmacists share for home BP monitoring:          Rest 10 minutes before taking your blood pressure.          Don't smoke or drink caffeinated beverages for at least 30 minutes before.          Take your blood pressure before (not after) you eat.          Sit comfortably with your back supported and both feet on the floor (don't cross your legs).          Elevate your arm to heart level on a table or a desk.          Use the proper sized cuff. It should fit smoothly and snugly around your bare upper arm. There should be enough room to slip a fingertip under the cuff. The bottom edge of the cuff should be 1 inch above the crease of the elbow.

## 2023-03-24 NOTE — Progress Notes (Signed)
Office Visit    Patient Name: Erica Valencia Date of Encounter: 03/24/2023  Primary Care Provider:  Dana Allan, MD Primary Cardiologist:  Julien Nordmann, MD  Chief Complaint    72 y.o. female w/ a h/o coronary Ca2+, HTN, HL, diast dysfxn, OSA, syncope, chest pain, and palpitations, who presents for f/u related to HTN.  Past Medical History  Subjective   Past Medical History:  Diagnosis Date   Allergy    Bronchitis    Coronary artery calcification seen on CT scan    a. 05/2021 Cardiac CT: Ca2+ = 108 (69th %'ile).   COVID-19 09/30/2020   Diastolic dysfunction    a. 11/2015 Echo: EF 60-65%, no rwma, GrI DD, nl RV fxn, trive MR/TR, RVSP .   Hemorrhoids    Hyperlipidemia    Hypertension    Scarlet fever    a. as child caused vision issues has to wear glasses now.   Sleep apnea    Syncope    a. 09/2020 Zio: Predominantly sinus rhythm @ 64 (41-203). 21 SVT runs, fastest 203 x 15 beats, longests 19 beats @ 99. Triggered events not assoc w/ signif arrhythmia.   Past Surgical History:  Procedure Laterality Date   APPENDECTOMY     age [age]   CARPAL TUNNEL RELEASE     left 2008   COLONOSCOPY WITH PROPOFOL N/A 09/23/2021   Procedure: COLONOSCOPY WITH PROPOFOL;  Surgeon: Regis Bill, MD;  Location: [facility] ENDOSCOPY;  Service: Endoscopy;  Laterality: N/A;   OTHER SURGICAL HISTORY     left great toe fusion 2012     Allergies  Allergies  Allergen Reactions   Sulfa Antibiotics Itching    Throat tight   Statins Other (See Comments)    Intolerance, multiple side effects   Telmisartan     Heart skipping beats, flank pain and back pain once stopped resolved and nightmares per pt     Zetia [Ezetimibe]     Back,kidney pain, muscle aches thighs   Penicillins Rash    Did it involve swelling of the face/tongue/throat, SOB, or low BP? No Did it involve sudden or severe rash/hives, skin peeling, or any reaction on the inside of your mouth or nose? No Did you need to seek  medical attention at a hospital or doctor's office? No When did it last happen?      childhood If all above answers are "NO", may proceed with cephalosporin use.   Rosuvastatin Anxiety and Other (See Comments)    Confusion and sleepiness      History of Present Illness      72 y.o. y/o female w/ a h/o coronary Ca2+, HTN, HL, diast dysfxn, OSA, syncope, chest pain, and palpitations.  Echo in 2017 showed nl LV fxn w/ GrI DD, RVSP of , and trace MR/TR.  She established care w/ Dr. Mariah Milling in 09/2018, following ED visit for atypical chest pain.  Pain was reproducible w/ palpation and no further ischemic eval was required.  In 09/2020, she was seen in the ED following a syncopal spell in the setting of COVID-19 infection.  Event monitoring showed brief runs of PSVT, though triggered events were not assoc w/ significant arrhythmia.  Cardiac CT in 05/2021 showed a Ca2+ score of 108 (69th percentile).  She was placed on rosuvastatin 5 mg daily, but this was subsequently discontinued due to complaints of confusion and fatigue.   Since her last visit in 05/2021 Mr. Ricciardi has done well.  She walks every day  for 45 minutes to an hour.  She denies chest pain, dyspnea, palpitations, PND, orthopnea, syncope, edema, or early satiety.  She sometimes has vertiginous symptoms, especially when lying down in bed at night.  The symptoms date back to her COVID diagnosis in 2022, and overall have improved with physical therapy.  She has been sticking to a low-fat diet and does not eat red meat.  She gets at least 5 servings of fruits and vegetables daily.  She is not interested in trying an alternate lipid agent.  Blood pressure is elevated today however, at home, she typically runs in the 1 teens to 120's. Objective  Home Medications    Current Outpatient Medications  Medication Sig Dispense Refill   amLODipine (NORVASC) 2.5 MG tablet Take 1 tablet (2.5 mg total) by mouth daily.     metoprolol succinate (TOPROL-XL) 25  MG 24 hr tablet Take 1 tablet (25 mg total) by mouth at bedtime. 90 tablet 3   Multiple Vitamins-Minerals (ALIVE ONCE DAILY WOMENS 50+ PO) Take 1 tablet by mouth daily.     cyanocobalamin (VITAMIN B12) 1000 MCG tablet Take by mouth. (Patient not taking: Reported on 03/24/2023)     No current facility-administered medications for this visit.     Physical Exam    VS:  BP (!) 160/80 (BP Location: Left Arm, Patient Position: Sitting, Cuff Size: Normal)   Pulse 60   Ht 4\' 11"  (1.499 m)   Wt 128 lb 12.8 oz (58.4 kg)   SpO2 97%   BMI 26.01 kg/m  , BMI Body mass index is 26.01 kg/m.   Vitals:   03/24/23 0919 03/24/23 1010  BP: (!) 160/80 (!) 168/82  Pulse: 60   SpO2: 97%         GEN: Well nourished, well developed, in no acute distress. HEENT: normal. Neck: Supple, no JVD, carotid bruits, or masses. Cardiac: RRR, no murmurs, rubs, or gallops. No clubbing, cyanosis, edema.  Radials 2+/PT 2+ and equal bilaterally.  Respiratory:  Respirations regular and unlabored, clear to auscultation bilaterally. GI: Soft, nontender, nondistended, BS + x 4. MS: no deformity or atrophy. Skin: warm and dry, no rash. Neuro:  Strength and sensation are intact. Psych: Normal affect.  Accessory Clinical Findings    ECG personally reviewed by me today - EKG Interpretation Date/Time:  Wednesday March 24 2023 09:26:43 EST Ventricular Rate:  60 PR Interval:  154 QRS Duration:  64 QT Interval:  422 QTC Calculation: 422 R Axis:   -25  Text Interpretation: Normal sinus rhythm Inferior infarct , age undetermined Anterior infarct , age undetermined Confirmed by Nicolasa Ducking [phone]) on 03/24/2023 9:33:31 AM  - no acute changes.  Lab Results  Component Value Date   WBC 5.9 12/31/2022   HGB 14.1 12/31/2022   HCT 42.0 12/31/2022   MCV 86.8 12/31/2022   PLT 275.0 12/31/2022   Lab Results  Component Value Date   CREATININE 0.63 12/31/2022   BUN 10 12/31/2022   NA 139 12/31/2022   K 4.3  12/31/2022   CL 102 12/31/2022   CO2 29 12/31/2022   Lab Results  Component Value Date   ALT 16 12/31/2022   AST 20 12/31/2022   ALKPHOS 67 12/31/2022   BILITOT 0.6 12/31/2022   Lab Results  Component Value Date   CHOL 194 12/31/2022   HDL 62.10 12/31/2022   LDLCALC 110 (H) 12/31/2022   TRIG 111.0 12/31/2022   CHOLHDL 3 12/31/2022    Lab Results  Component Value Date  HGBA1C 5.8 12/31/2022   Lab Results  Component Value Date   TSH 1.46 12/31/2022      Assessment & Plan    1.  Coronary calcium on CT: Calcium score of 108 noted on cardiac CT in 2023.  She is very active without symptoms or limitations.  She previously did not tolerate statins or Zetia and is not interested in alternate agent at this time.  Most recent LDL was 110.  Previously prescribed aspirin 81 mg daily but has not been taking, will start taking daily.  2.  Primary hypertension: Pressure elevated today at 160/80 and 168/82 on repeat.  She has not taken her morning dose of amlodipine yet.  She checks her pressure daily at home and notes that the typically runs in the 1 teens to 120s.  Primary care visit earlier this year, she was in the 120s on both occasions.  We agreed that she will check her blood pressures at home and if she is consistently in the 130s or above, she will send [place] a MyChart message, at which time we can plan to increase her amlodipine back to 5 mg daily.  3.  Hyperlipidemia: LDL was 110 in August.  Unfortunately, she does not tolerate statins or Zetia.  She has been adhering to a low-fat low-cholesterol diet and exercising regularly.  BMI is slightly above goal at 26.01.  4.  Obstructive sleep apnea: Did not tolerate CPAP.  Has been using a mouthguard and has been sleeping much better and feels well rested in the morning.  5.  Disposition: Patient to send his blood pressure recordings.  Provided pressures are stable at home, she will follow-up in 1 year or sooner if necessary.  Nicolasa Ducking, NP 03/24/2023, 9:33 AM

## 2023-04-02 ENCOUNTER — Other Ambulatory Visit: Payer: Self-pay | Admitting: Family Medicine

## 2023-04-02 DIAGNOSIS — Z1231 Encounter for screening mammogram for malignant neoplasm of breast: Secondary | ICD-10-CM

## 2023-04-21 DIAGNOSIS — H8112 Benign paroxysmal vertigo, left ear: Secondary | ICD-10-CM | POA: Diagnosis not present

## 2023-04-21 DIAGNOSIS — H903 Sensorineural hearing loss, bilateral: Secondary | ICD-10-CM | POA: Diagnosis not present

## 2023-04-27 ENCOUNTER — Ambulatory Visit
Admission: RE | Admit: 2023-04-27 | Discharge: 2023-04-27 | Disposition: A | Discharge status: Home / Self Care | Payer: Medicare HMO | Source: Ambulatory Visit | Attending: Family Medicine | Admitting: Family Medicine

## 2023-04-27 DIAGNOSIS — Z1231 Encounter for screening mammogram for malignant neoplasm of breast: Secondary | ICD-10-CM | POA: Diagnosis not present

## 2023-05-04 DIAGNOSIS — H8112 Benign paroxysmal vertigo, left ear: Secondary | ICD-10-CM | POA: Diagnosis not present

## 2023-06-08 ENCOUNTER — Other Ambulatory Visit: Payer: Self-pay | Admitting: Family Medicine

## 2023-06-08 ENCOUNTER — Telehealth: Payer: Self-pay | Admitting: Family Medicine

## 2023-06-08 DIAGNOSIS — I10 Essential (primary) hypertension: Secondary | ICD-10-CM

## 2023-06-08 MED ORDER — AMLODIPINE BESYLATE 5 MG PO TABS: 5.0000 mg | ORAL_TABLET | Freq: Every day | ORAL | 3 refills | Status: DC

## 2023-06-08 NOTE — Telephone Encounter (Signed)
Copied from CRM [id number]. Topic: Clinical - Medication Refill >> Jun 08, 2023 10:58 AM Clayton Bibles wrote: Most Recent Primary Care Visit:  Provider: Dana Allan  Department: [facility]  Visit Type: OFFICE VISIT  Date: 01/04/2023  Medication: amLODipine (NORVASC) 2.5 MG tablet - take twice daily 1 tablet - On medication list it states 1 tablet daily and Dr. Clent Ridges told her twice daily. 90 day supply  Has the patient contacted their pharmacy?  Yes twice (Agent: If no, request that the patient contact the pharmacy for the refill. If patient does not wish to contact the pharmacy document the reason why and proceed with request.) (Agent: If yes, when and what did the pharmacy advise?) Needs authorization  Is this the correct pharmacy for this prescription? [facility] If no, delete pharmacy and type the correct one.  This is the patient's preferred pharmacy:  [id number] - [address] [place] [address] Phone: [phone] Fax: [phone]   Has the prescription been filled recently? No  Is the patient out of the medication? She has 6 days left  Has the patient been seen for an appointment in the last year OR does the patient have an upcoming appointment? Yes  Can we respond through MyChart? No  Agent: Please be advised that Rx refills may take up to 3 business days. We ask that you follow-up with your pharmacy.

## 2023-06-08 NOTE — Telephone Encounter (Signed)
Called pt and she wanted the 2.5 so she can take 1 in the am and 1 in the pm. She stated that she did not want to take the 5mg .

## 2023-06-09 ENCOUNTER — Other Ambulatory Visit: Payer: Self-pay | Admitting: Family Medicine

## 2023-06-09 DIAGNOSIS — I10 Essential (primary) hypertension: Secondary | ICD-10-CM

## 2023-06-09 MED ORDER — AMLODIPINE BESYLATE 2.5 MG PO TABS
2.5000 mg | ORAL_TABLET | Freq: Two times a day (BID) | ORAL | 3 refills | Status: DC
Start: 2023-06-09 — End: 2023-12-27

## 2023-06-09 NOTE — Telephone Encounter (Signed)
 Called and informed pt that medication has been sent to the pharmacy.

## 2023-06-24 ENCOUNTER — Telehealth: Payer: Self-pay

## 2023-06-24 NOTE — Telephone Encounter (Signed)
 Copied from CRM [id number]. Topic: Clinical - Medical Advice >> Jun 24, 2023  2:19 PM Isabell A wrote: Reason for CRM: Patient would like to ask Dr.Walsh if she should come in for an office visit or go to the OBGYN due to prolapse issues.

## 2023-06-24 NOTE — Telephone Encounter (Signed)
 Called and scheduled pt an appointment 06/29/2023 at 2:40 pm

## 2023-06-29 ENCOUNTER — Ambulatory Visit: Payer: Medicare HMO | Admitting: Family Medicine

## 2023-06-29 ENCOUNTER — Encounter: Payer: Self-pay | Admitting: Family Medicine

## 2023-06-29 VITALS — BP 140/74 | HR 67 | Temp 97.9°F | Resp 18 | Ht 59.0 in | Wt 130.2 lb

## 2023-06-29 DIAGNOSIS — N819 Female genital prolapse, unspecified: Secondary | ICD-10-CM

## 2023-06-29 DIAGNOSIS — E785 Hyperlipidemia, unspecified: Secondary | ICD-10-CM | POA: Diagnosis not present

## 2023-06-29 DIAGNOSIS — E538 Deficiency of other specified B group vitamins: Secondary | ICD-10-CM | POA: Diagnosis not present

## 2023-06-29 DIAGNOSIS — E559 Vitamin D deficiency, unspecified: Secondary | ICD-10-CM

## 2023-06-29 DIAGNOSIS — I10 Essential (primary) hypertension: Secondary | ICD-10-CM

## 2023-06-29 MED ORDER — METOPROLOL SUCCINATE ER 25 MG PO TB24
25.0000 mg | ORAL_TABLET | Freq: Every day | ORAL | 3 refills | Status: DC
Start: 2023-06-29 — End: 2023-12-27

## 2023-06-29 MED ORDER — METOPROLOL SUCCINATE ER 25 MG PO TB24
25.0000 mg | ORAL_TABLET | Freq: Every day | ORAL | 3 refills | Status: DC
Start: 2023-06-29 — End: 2023-06-29

## 2023-06-29 NOTE — Assessment & Plan Note (Addendum)
New onset of a bulge in the vaginal area, likely due to loosening of pelvic structures with age and physical activity. No urinary symptoms. Patient able to manually reposition the prolapse. Pelvic exam deferred per patients preference. -Refer to OB/GYN for evaluation and possible pessary fitting. -Continue Kegel exercises to strengthen pelvic floor muscles. -Consider pelvic floor therapy.

## 2023-06-29 NOTE — Patient Instructions (Addendum)
It was a pleasure meeting you today. Thank you for allowing me to take part in your health care.  Our goals for today as we discussed include:  Sending referral to OBGYN for evaluation of prolapse   Continue Kegel exercise Can send referral to physical therapy for pelvic floor therapy if no improvement.  Refill sent for requested medication  We will get some labs today.  If they are abnormal or we need to do something about them, I will call you.  If they are normal, I will send you a message on MyChart (if it is active) or a letter in the mail.  If you don't hear from [place] in 2 weeks, please call the office at the number below.    Follow up in 6 months.  Fast for blood work at that time  This is a list of the screening recommended for you and due dates:  Health Maintenance  Topic Date Due   Flu Shot  12/17/2022   Medicare Annual Wellness Visit  07/02/2023   Mammogram  04/26/2025   DTaP/Tdap/Td vaccine (2 - Td or Tdap) 10/01/2030   Colon Cancer Screening  09/24/2031   DEXA scan (bone density measurement)  Completed   HPV Vaccine  Aged Out   Pneumonia Vaccine  Discontinued   COVID-19 Vaccine  Discontinued   Hepatitis C Screening  Discontinued   Zoster (Shingles) Vaccine  Discontinued     If you have any questions or concerns, please do not hesitate to call the office at [phone].  I look forward to our next visit and until then take care and stay safe.  Regards,   Dana Allan, MD   [facility]

## 2023-06-29 NOTE — Assessment & Plan Note (Addendum)
Elevated blood pressure at today's visit, but patient reports normal readings at home. Currently on Amlodipine 2.5mg  twice daily and Metoprolol.  Home reading <130/80 -No changes to current antihypertensive regimen. -Refill Metoprolol XR 25 mg daily -Recheck blood pressure at next visit.

## 2023-06-29 NOTE — Progress Notes (Signed)
SUBJECTIVE:   Chief Complaint  Patient presents with   Vaginal Prolapse    Noticed 06/24/2023 mostly on the right side to the vaginal   HPI Presents for acute visit  Discussed the use of AI scribe software for clinical note transcription with the patient, who gave verbal consent to proceed.  History of Present Illness Erica Valencia is a 73 year old female who presents with a vaginal bulge noticed a couple of weeks ago.  A vaginal bulge on the right side was noticed a couple of weeks ago, initially accompanied by pain. She attributes the pain to possible overexertion during exercise or moving furniture. She has been able to manually reposition the bulge, describing the sensation as similar to being 'ready to deliver a kid.' This is a new issue for her.  She has a history of fibroids and underwent a procedure to remove the uterine lining due to excessive bleeding in her fifties. She has had two normal vaginal deliveries. Recently, she has experienced some constipation, which she acknowledges may contribute to the issue. No urinary frequency, hematuria, or movement of the bulge when coughing. She has been performing Kegel exercises but is unsure of their correctness.  Her blood pressure at home is typically between 125 and 130 mmHg. She is currently taking amlodipine 2.5 mg twice a day, although she notes a change in the appearance of the medication recently.    PERTINENT PMH / PSH: As above  OBJECTIVE:  BP (!) 140/74   Pulse 67   Temp 97.9 F (36.6 C)   Resp 18   Ht 4\' 11"  (1.499 m)   Wt 130 lb 4 oz (59.1 kg)   SpO2 97%   BMI 26.31 kg/m    Physical Exam Vitals reviewed.  Constitutional:      General: She is not in acute distress.    Appearance: Normal appearance. She is normal weight. She is not ill-appearing, toxic-appearing or diaphoretic.  Eyes:     General:        Right eye: No discharge.        Left eye: No discharge.     Conjunctiva/sclera: Conjunctivae normal.   Cardiovascular:     Rate and Rhythm: Normal rate.  Pulmonary:     Effort: Pulmonary effort is normal.  Abdominal:     Tenderness: There is no abdominal tenderness.  Musculoskeletal:        General: Normal range of motion.  Skin:    General: Skin is warm and dry.  Neurological:     General: No focal deficit present.     Mental Status: She is alert and oriented to person, place, and time. Mental status is at baseline.  Psychiatric:        Mood and Affect: Mood normal.        Behavior: Behavior normal.        Thought Content: Thought content normal.        Judgment: Judgment normal.           01/04/2023    1:32 PM 09/02/2022    3:31 PM 08/04/2022    3:18 PM 12/02/2021    8:25 AM 05/22/2021    1:27 PM  Depression screen PHQ 2/9  Decreased Interest 0 0 0 0 0  Down, Depressed, Hopeless 0 0 0 0 0  PHQ - 2 Score 0 0 0 0 0  Altered sleeping 0 0     Tired, decreased energy 0 0     Change in  appetite 0 0     Feeling bad or failure about yourself  0 0     Trouble concentrating 0 0     Moving slowly or fidgety/restless 0 0     Suicidal thoughts 0 0     PHQ-9 Score 0 0     Difficult doing work/chores Not difficult at all Not difficult at all         01/04/2023    1:32 PM 09/02/2022    3:31 PM 08/04/2022    3:19 PM 11/17/2019   10:07 AM  GAD 7 : Generalized Anxiety Score  Nervous, Anxious, on Edge 0 0 0 0  Control/stop worrying 0 0 0 0  Worry too much - different things 0 0 0 0  Trouble relaxing 0 0 0 0  Restless 0 0 0 0  Easily annoyed or irritable 0 0 0 0  Afraid - awful might happen 0 0 0 0  Total GAD 7 Score 0 0 0 0  Anxiety Difficulty Not difficult at all Not difficult at all Not difficult at all Not difficult at all    ASSESSMENT/PLAN:  Female genital prolapse, unspecified type Assessment & Plan: New onset of a bulge in the vaginal area, likely due to loosening of pelvic structures with age and physical activity. No urinary symptoms. Patient able to manually reposition  the prolapse. Pelvic exam deferred per patients preference. -Refer to OB/GYN for evaluation and possible pessary fitting. -Continue Kegel exercises to strengthen pelvic floor muscles. -Consider pelvic floor therapy.  Orders: -     Ambulatory referral to Obstetrics / Gynecology  Essential hypertension Assessment & Plan: Elevated blood pressure at today's visit, but patient reports normal readings at home. Currently on Amlodipine 2.5mg  twice daily and Metoprolol.  Home reading <130/80 -No changes to current antihypertensive regimen. -Refill Metoprolol XR 25 mg daily -Recheck blood pressure at next visit.   Orders: -     Metoprolol Succinate ER; Take 1 tablet (25 mg total) by mouth at bedtime.  Dispense: 90 tablet; Refill: 3 -     Comprehensive metabolic panel; Future  Vitamin B 12 deficiency -     Vitamin B12; Future  Vitamin D deficiency -     VITAMIN D 25 Hydroxy (Vit-D Deficiency, Fractures); Future  Hyperlipidemia, unspecified hyperlipidemia type -     Lipid panel; Future    PDMP reviewed  Return in about 6 months (around 12/27/2023) for PCP, HTN.  Dana Allan, MD

## 2023-07-05 ENCOUNTER — Telehealth: Payer: Medicare HMO | Admitting: Physician Assistant

## 2023-07-05 DIAGNOSIS — R6889 Other general symptoms and signs: Secondary | ICD-10-CM | POA: Diagnosis not present

## 2023-07-05 MED ORDER — OSELTAMIVIR PHOSPHATE 75 MG PO CAPS: 75.0000 mg | ORAL_CAPSULE | Freq: Two times a day (BID) | ORAL | 0 refills | Status: DC

## 2023-07-05 MED ORDER — ALBUTEROL SULFATE HFA 108 (90 BASE) MCG/ACT IN AERS: 1.0000 | INHALATION_SPRAY | Freq: Four times a day (QID) | RESPIRATORY_TRACT | 0 refills | Status: AC | PRN

## 2023-07-05 MED ORDER — BENZONATATE 100 MG PO CAPS
100.0000 mg | ORAL_CAPSULE | Freq: Three times a day (TID) | ORAL | 0 refills | Status: DC | PRN
Start: 2023-07-05 — End: 2023-09-13

## 2023-07-05 NOTE — Patient Instructions (Signed)
 [name], thank you for joining [name], PA-C for today's virtual visit.  While this provider is not your primary care provider (PCP), if your PCP is located in our provider database this encounter information will be shared with them immediately following your visit.   A [place] MyChart account gives you access to today's visit and all your visits, tests, and labs performed at [facility] " click here if you don't have a [place] MyChart account or go to [email or web address]  Consent: (Patient) Erica Valencia provided verbal consent for this virtual visit at the beginning of the encounter.  Current Medications:  Current Outpatient Medications:    albuterol (VENTOLIN HFA) 108 (90 Base) MCG/ACT inhaler, Inhale 1-2 puffs into the lungs every 6 (six) hours as needed., Disp: 8 g, Rfl: 0   benzonatate (TESSALON) 100 MG capsule, Take 1-2 capsules (100-200 mg total) by mouth 3 (three) times daily as needed., Disp: 30 capsule, Rfl: 0   oseltamivir (TAMIFLU) 75 MG capsule, Take 1 capsule (75 mg total) by mouth 2 (two) times daily., Disp: 10 capsule, Rfl: 0   amLODipine (NORVASC) 2.5 MG tablet, Take 1 tablet (2.5 mg total) by mouth 2 (two) times daily., Disp: 180 tablet, Rfl: 3   aspirin EC 81 MG tablet, Take 1 tablet (81 mg total) by mouth daily. Swallow whole., Disp: , Rfl:    metoprolol succinate (TOPROL-XL) 25 MG 24 hr tablet, Take 1 tablet (25 mg total) by mouth at bedtime., Disp: 90 tablet, Rfl: 3   Multiple Vitamins-Minerals (ALIVE ONCE DAILY WOMENS 50+ PO), Take 1 tablet by mouth daily., Disp: , Rfl:    Medications ordered in this encounter:  Meds ordered this encounter  Medications   albuterol (VENTOLIN HFA) 108 (90 Base) MCG/ACT inhaler    Sig: Inhale 1-2 puffs into the lungs every 6 (six) hours as needed.    Dispense:  8 g    Refill:  0    Supervising Provider:   Merrilee Jansky [[id number]]   benzonatate (TESSALON) 100 MG capsule    Sig: Take 1-2  capsules (100-200 mg total) by mouth 3 (three) times daily as needed.    Dispense:  30 capsule    Refill:  0    Supervising Provider:   Merrilee Jansky [id number]   oseltamivir (TAMIFLU) 75 MG capsule    Sig: Take 1 capsule (75 mg total) by mouth 2 (two) times daily.    Dispense:  10 capsule    Refill:  0    Supervising Provider:   Merrilee Jansky [[id number]]     *If you need refills on other medications prior to your next appointment, please contact your pharmacy*  Follow-Up: Call back or seek an in-person evaluation if the symptoms worsen or if the condition fails to improve as anticipated.   Virtual Care [phone]  Other Instructions Influenza, Adult Influenza is also called the flu. It's an infection that affects your respiratory tract. This includes your nose, throat, windpipe, and lungs. The flu is contagious. This means it spreads easily from person to person. It causes symptoms that are like a cold. It can also cause a high fever and body aches. What are the causes? The flu is caused by the influenza virus. You can get it by: Breathing in droplets that are in the air after an infected person coughs or sneezes. Touching something that has the virus on it and then touching your mouth, nose, or eyes. What increases the risk?  You may be more likely to get the flu if: You don't wash your hands often. You're near a lot of people during cold and flu season. You touch your mouth, eyes, or nose without washing your hands first. You don't get a flu shot each year. You may also be more at risk for the flu and serious problems, such as a lung infection called pneumonia, if: You're older than 65. You're pregnant. Your immune system is weak. Your immune system is your body's defense system. You have a long-term, or chronic, condition, such as: Heart, kidney, or lung disease. Diabetes. A liver disorder. Asthma. You're very overweight. You have anemia. This is when  you don't have enough red blood cells in your body. What are the signs or symptoms? Flu symptoms often start all of a sudden. They may last 4-14 days and include: Fever and chills. Headaches, body aches, or muscle aches. Sore throat. Cough. Runny or stuffy nose. Discomfort in your chest. Not wanting to eat as much as normal. Feeling weak or tired. Feeling dizzy. Nausea or vomiting. How is this diagnosed? The flu may be diagnosed based on your symptoms and medical history. You may also have a physical exam. A swab may be taken from your nose or throat and tested for the virus. How is this treated? If the flu is found early, you can be treated with antiviral medicine. This may be given to you by mouth or through an IV. It can help you feel less sick and get better faster. Taking care of yourself at home can also help your symptoms get better. Your health care provider may tell you to: Take over-the-counter medicines. Drink lots of fluids. The flu often goes away on its own. If you have very bad symptoms or problems caused by the flu, you may need to be treated in a hospital. Follow these instructions at home: Activity Rest as needed. Get lots of sleep. Stay home from work or school as told by your provider. Leave home only to go see your provider. Do not leave home for other reasons until you don't have a fever for 24 hours without taking medicine. Eating and drinking Take an oral rehydration solution (ORS). This is a drink that is sold at pharmacies and stores. Drink enough fluid to keep your pee pale yellow. Try to drink small amounts of clear fluids. These include water, ice chips, fruit juice mixed with water, and low-calorie sports drinks. Try to eat bland foods that are easy to digest. These include bananas, applesauce, rice, lean meats, toast, and crackers. Avoid drinks that have a lot of sugar or caffeine in them. These include energy drinks, regular sports drinks, and soda. Do  not drink alcohol. Do not eat spicy or fatty foods. General instructions     Take your medicines only as told by your provider. Use a cool mist humidifier to add moisture to the air in your home. This can make it easier for you to breathe. You should also clean the humidifier every day. To do so: Empty the water. Pour clean water in. Cover your mouth and nose when you cough or sneeze. Wash your hands with soap and water often and for at least 20 seconds. It's extra important to do so after you cough or sneeze. If you can't use soap and water, use hand sanitizer. How is this prevented?  Get a flu shot every year. Ask your provider when you should get your flu shot. Stay away from people  who are sick during fall and winter. Fall and winter are cold and flu season. Contact a health care provider if: You get new symptoms. You have chest pain. You have watery poop, also called diarrhea. You have a fever. Your cough gets worse. You start to have more mucus. You feel like you may vomit, or you vomit. Get help right away if: You become short of breath or have trouble breathing. Your skin or nails turn blue. You have very bad pain or stiffness in your neck. You get a sudden headache or pain in your face or ear. You vomit each time you eat or drink. These symptoms may be an emergency. Call 911 right away. Do not wait to see if the symptoms will go away. Do not drive yourself to the hospital. This information is not intended to replace advice given to you by your health care provider. Make sure you discuss any questions you have with your health care provider. Document Revised: 02/04/2023 Document Reviewed: 06/11/2022 Elsevier Patient Education  2024 [employer] Inc.   If you have been instructed to have an in-person evaluation today at a local Urgent Care facility, please use the link below. It will take you to a list of all of our available [facility], including address,  phone number and hours of operation. Please do not delay care.  [facility]  If you or a family member do not have a primary care provider, use the link below to schedule a visit and establish care. When you choose a [facility] primary care physician or advanced practice provider, you gain a long-term partner in health. Find a Primary Care Provider  Learn more about 's in-office and virtual care options:  - Get Care Now

## 2023-07-05 NOTE — Progress Notes (Signed)
 Virtual Visit Consent   Erica Valencia, you are scheduled for a virtual visit with a  provider today. Just as with appointments in the office, your consent must be obtained to participate. Your consent will be active for this visit and any virtual visit you may have with one of our providers in the next 365 days. If you have a MyChart account, a copy of this consent can be sent to you electronically.  As this is a virtual visit, video technology does not allow for your provider to perform a traditional examination. This may limit your provider's ability to fully assess your condition. If your provider identifies any concerns that need to be evaluated in person or the need to arrange testing (such as labs, EKG, etc.), we will make arrangements to do so. Although advances in technology are sophisticated, we cannot ensure that it will always work on either your end or our end. If the connection with a video visit is poor, the visit may have to be switched to a telephone visit. With either a video or telephone visit, we are not always able to ensure that we have a secure connection.  By engaging in this virtual visit, you consent to the provision of healthcare and authorize for your insurance to be billed (if applicable) for the services provided during this visit. Depending on your insurance coverage, you may receive a charge related to this service.  I need to obtain your verbal consent now. Are you willing to proceed with your visit today? Erica Valencia has provided verbal consent on 07/05/2023 for a virtual visit (video or telephone). [name], PA-C  Date: 07/05/2023 2:39 PM   Virtual Visit via Video Note   I, [name], connected with  Erica Valencia  ([id number], 17-May-1951) on 07/05/23 at  2:30 PM EST by a video-enabled telemedicine application and verified that I am speaking with the correct person using two identifiers.  Location: Patient: Virtual Visit Location  Patient: Home Provider: Virtual Visit Location Provider: Home Office   I discussed the limitations of evaluation and management by telemedicine and the availability of in person appointments. The patient expressed understanding and agreed to proceed.    History of Present Illness: Erica Valencia is a 73 y.o. who identifies as a female who was assigned female at birth, and is being seen today for flu-like symptoms.  HPI: Cough This is a new problem. The current episode started yesterday. The problem has been unchanged. The problem occurs every few minutes. The cough is Non-productive. Associated symptoms include chills, a fever (101 highest, 99.1 yesterday, 99 today), headaches, myalgias, shortness of breath and wheezing. Pertinent negatives include no chest pain, nasal congestion, postnasal drip, rhinorrhea or sore throat. The symptoms are aggravated by lying down and cold air. Treatments tried: Mucinex cough and chest congestion for HBP. The treatment provided no relief. Her past medical history is significant for asthma and bronchitis.  Had flu exposure from Grandchildren on 07/02/23   Problems:  Patient Active Problem List   Diagnosis Date Noted   Female genital prolapse 06/29/2023   Vitamin B 12 deficiency 06/29/2023   Vitamin D deficiency 06/29/2023   Hemorrhoids 12/02/2021   Statin intolerance    Prediabetes 11/15/2020   Annual physical exam 04/17/2020   Osteoporosis 01/25/2019   OSA (obstructive sleep apnea) 12/09/2018   Chest pain of uncertain etiology 09/18/2018   Overweight (BMI 25.0-29.9) 06/10/2018   Hyperlipidemia 12/16/2017   Essential hypertension 05/27/2017    Allergies:  Allergies  Allergen Reactions   Sulfa Antibiotics Itching    Throat tight   Statins Other (See Comments)    Intolerance, multiple side effects   Telmisartan     Heart skipping beats, flank pain and back pain once stopped resolved and nightmares per pt     Zetia [Ezetimibe]     Back,kidney  pain, muscle aches thighs   Penicillins Rash    Did it involve swelling of the face/tongue/throat, SOB, or low BP? No Did it involve sudden or severe rash/hives, skin peeling, or any reaction on the inside of your mouth or nose? No Did you need to seek medical attention at a hospital or doctor's office? No When did it last happen?      childhood If all above answers are "NO", may proceed with cephalosporin use.   Rosuvastatin Anxiety and Other (See Comments)    Confusion and sleepiness   Medications:  Current Outpatient Medications:    albuterol (VENTOLIN HFA) 108 (90 Base) MCG/ACT inhaler, Inhale 1-2 puffs into the lungs every 6 (six) hours as needed., Disp: 8 g, Rfl: 0   benzonatate (TESSALON) 100 MG capsule, Take 1-2 capsules (100-200 mg total) by mouth 3 (three) times daily as needed., Disp: 30 capsule, Rfl: 0   oseltamivir (TAMIFLU) 75 MG capsule, Take 1 capsule (75 mg total) by mouth 2 (two) times daily., Disp: 10 capsule, Rfl: 0   amLODipine (NORVASC) 2.5 MG tablet, Take 1 tablet (2.5 mg total) by mouth 2 (two) times daily., Disp: 180 tablet, Rfl: 3   aspirin EC 81 MG tablet, Take 1 tablet (81 mg total) by mouth daily. Swallow whole., Disp: , Rfl:    metoprolol succinate (TOPROL-XL) 25 MG 24 hr tablet, Take 1 tablet (25 mg total) by mouth at bedtime., Disp: 90 tablet, Rfl: 3   Multiple Vitamins-Minerals (ALIVE ONCE DAILY WOMENS 50+ PO), Take 1 tablet by mouth daily., Disp: , Rfl:   Observations/Objective: Patient is well-developed, well-nourished in no acute distress.  Resting comfortably at home.  Head is normocephalic, atraumatic.  No labored breathing.  Speech is clear and coherent with logical content.  Patient is alert and oriented at baseline.  Deep bronchial cough heard a few times through call not limiting speech  Assessment and Plan: 1. Flu-like symptoms (Primary) - albuterol (VENTOLIN HFA) 108 (90 Base) MCG/ACT inhaler; Inhale 1-2 puffs into the lungs every 6 (six)  hours as needed.  Dispense: 8 g; Refill: 0 - benzonatate (TESSALON) 100 MG capsule; Take 1-2 capsules (100-200 mg total) by mouth 3 (three) times daily as needed.  Dispense: 30 capsule; Refill: 0 - oseltamivir (TAMIFLU) 75 MG capsule; Take 1 capsule (75 mg total) by mouth 2 (two) times daily.  Dispense: 10 capsule; Refill: 0  - Suspect influenza due to symptoms and positive exposure - Tamiflu prescribed - Tessalon perles for cough - Albuterol inhaler prescribed for shortness of breath and/or wheezing - Continue OTC medication of choice for symptomatic management - Push fluids - Rest - Seek in person evaluation if symptoms worsen or fail to improve   Follow Up Instructions: I discussed the assessment and treatment plan with the patient. The patient was provided an opportunity to ask questions and all were answered. The patient agreed with the plan and demonstrated an understanding of the instructions.  A copy of instructions were sent to the patient via MyChart unless otherwise noted below.    The patient was advised to call back or seek an in-person evaluation if the symptoms  worsen or if the condition fails to improve as anticipated.    [name], PA-C

## 2023-07-22 IMAGING — MG MM DIGITAL SCREENING BILAT W/ TOMO AND CAD
6 of 10 series · 6 of 30 positions shown · non-contrast
Comparison: Previous exam(s).

CLINICAL DATA: Screening.

EXAM:
DIGITAL SCREENING BILATERAL MAMMOGRAM WITH TOMOSYNTHESIS AND CAD
TECHNIQUE: Bilateral screening digital craniocaudal and mediolateral oblique
mammograms were obtained. Bilateral screening digital breast
tomosynthesis was performed. The images were evaluated with
computer-aided detection.

[L MLO synth-2D (1 of 2)]
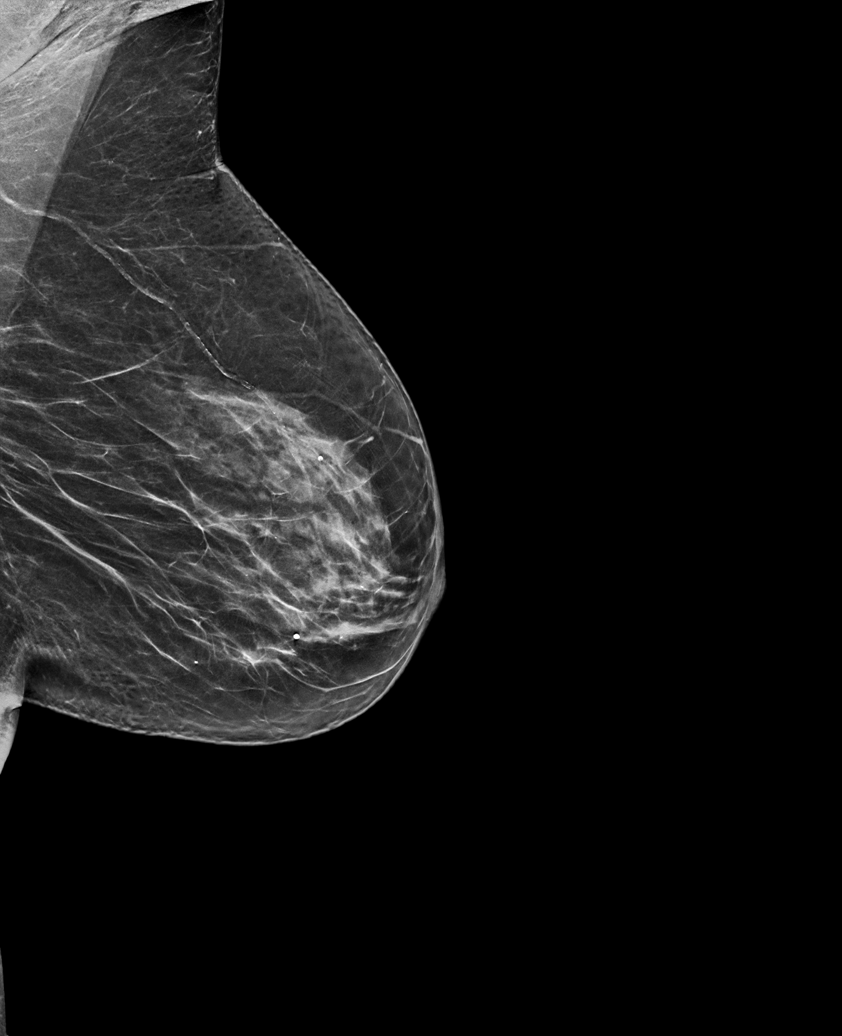

[L MLO synth-2D (2 of 2)]
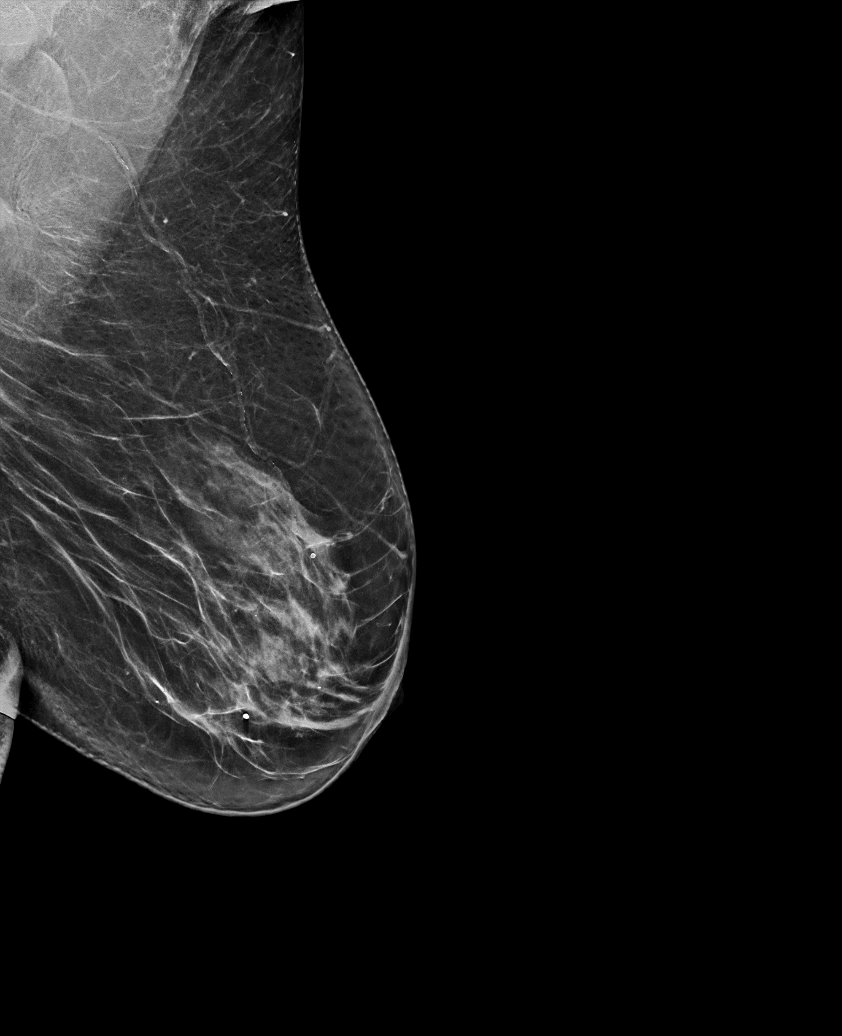

[R CC synth-2D]
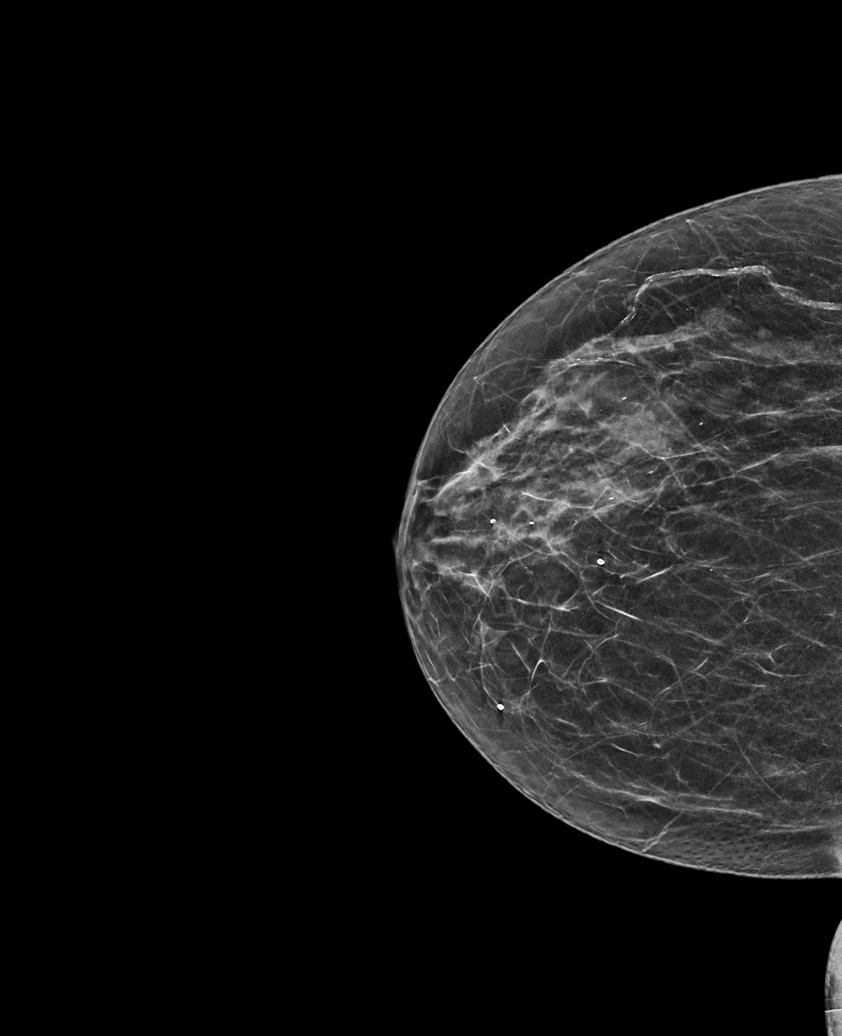

[L CC synth-2D]
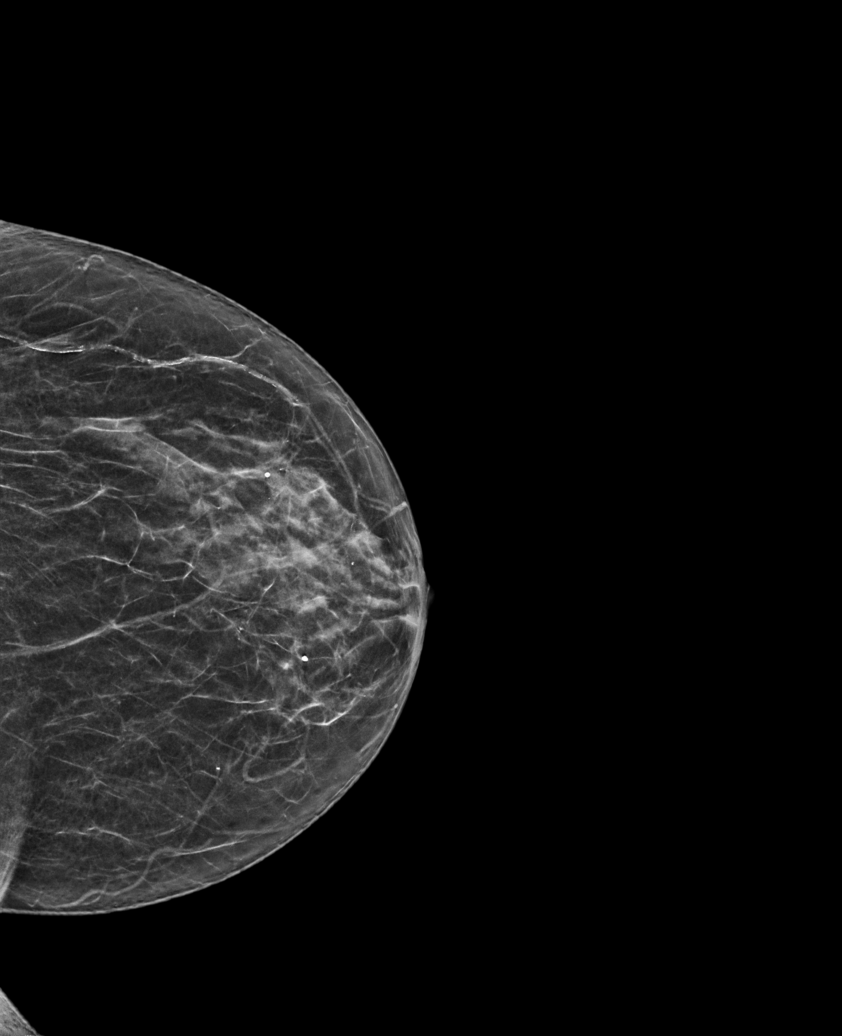

[R MLO synth-2D]
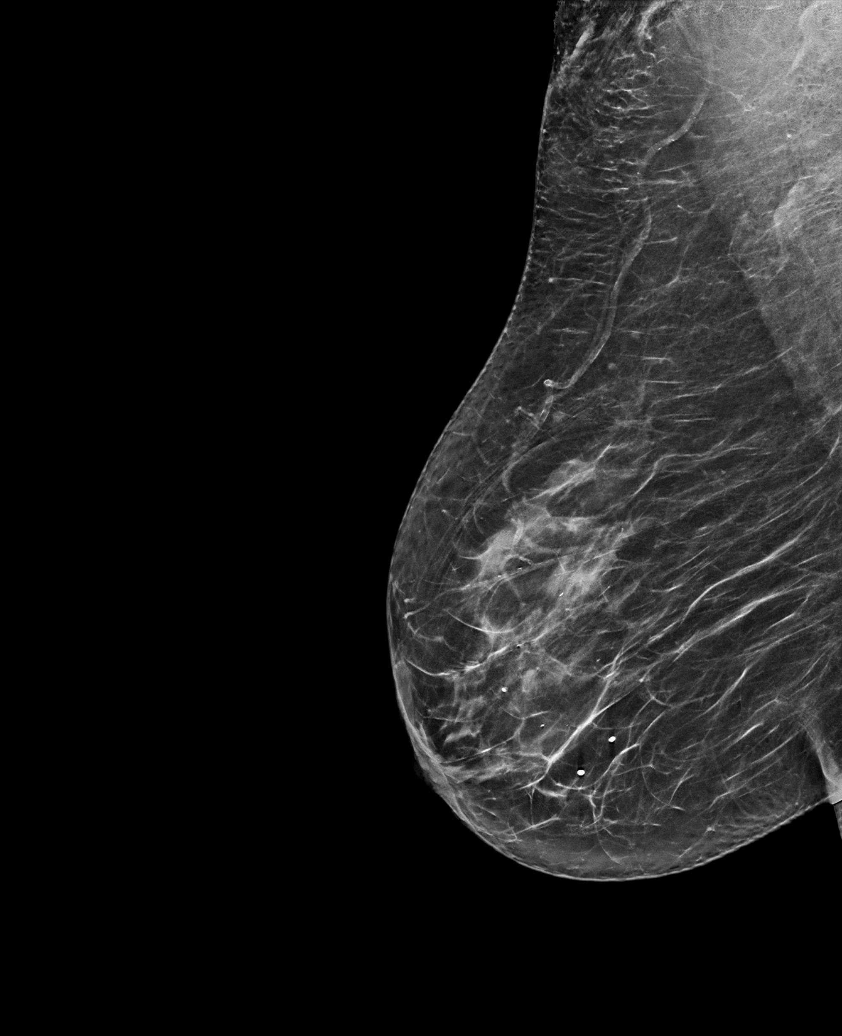

[L MLO tomo · tomo slice 39/77.0]
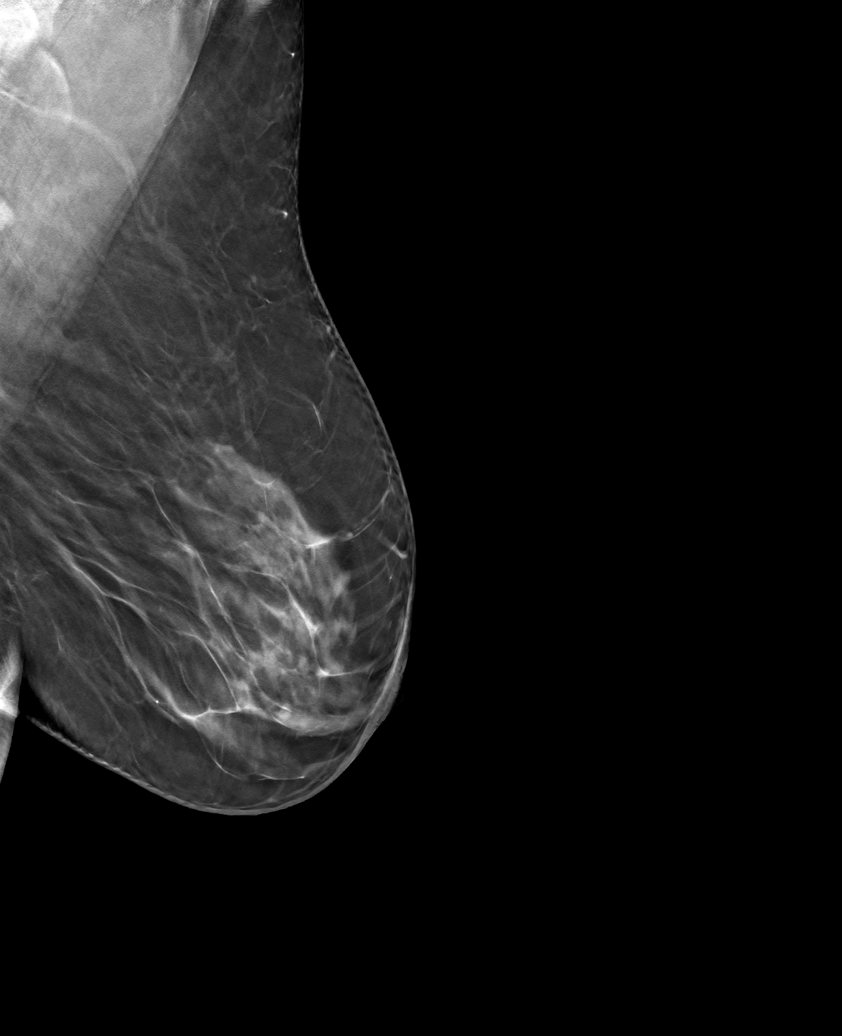

[6 of 30 positions shown; findings below may reference images not displayed]

ACR Breast Density Category c: The breast tissue is heterogeneously
dense, which may obscure small masses.
FINDINGS: There are no findings suspicious for malignancy.
IMPRESSION: No mammographic evidence of malignancy. A result letter of this
screening mammogram will be mailed directly to the patient.

RECOMMENDATION:
Screening mammogram in one year. (Code:Q3-W-BC3)

BI-RADS CATEGORY  1: Negative.

## 2023-08-03 ENCOUNTER — Encounter: Payer: Medicare HMO | Admitting: Obstetrics

## 2023-09-13 ENCOUNTER — Encounter: Payer: Self-pay | Admitting: Obstetrics

## 2023-09-13 ENCOUNTER — Ambulatory Visit: Admitting: Obstetrics

## 2023-09-13 VITALS — BP 160/71 | HR 62 | Ht 59.0 in | Wt 130.0 lb

## 2023-09-13 DIAGNOSIS — R102 Pelvic and perineal pain unspecified side: Secondary | ICD-10-CM | POA: Insufficient documentation

## 2023-09-13 DIAGNOSIS — N952 Postmenopausal atrophic vaginitis: Secondary | ICD-10-CM | POA: Insufficient documentation

## 2023-09-13 MED ORDER — ESTRADIOL 0.1 MG/GM VA CREA: 1.0000 | TOPICAL_CREAM | VAGINAL | 6 refills | Status: DC

## 2023-09-13 NOTE — Progress Notes (Signed)
 GYNECOLOGY PROGRESS NOTE  Subjective:  PCP: Valli Gaw, MD  Patient ID: Erica Valencia, female    DOB: 04-17-1951, 73 y.o.   MRN: [id number]  HPI  Patient is a 73 y.o. [id number] female who presents for referral from PCP Dr. Valli Gaw for possible pelvic organ prolapse. Noted in Feb '25 on her right vaginal wall, was initially painful, thought to be from over-straining while helping her son move. Nothing protruded externally and she felt internally, felt a small bulge and manually reduced. This happens intermittently, about once a month, and usually when straining. She does suffer from constipation and reports hx of hemorrhoids, none currently. Also hx of fibroids and in her 54s, had what sounds like an ablation or D&C, is unsure, but still has her uterus and ovaries. She has been doing kegels occasionally and feels like it may be helping.  Of note, pt's BP is 153/65, and on recheck 160/71 today. She has PMH of chronic HTN and takes anti-HTN medications, states she did take them today. Drank a bunch of caffeine right before her appointment and feels this is the cause. Denies HA, vision changes, palpitations, dizziness/lightheadedness, or weakness.   OB History  Gravida Para Term Preterm AB Living  2 2 2   2   SAB IAB Ectopic Multiple Live Births          # Outcome Date GA Lbr Len/2nd Weight Sex Type Anes PTL Lv  2 Term           1 Term            Past Medical History:  Diagnosis Date   Allergy    Bronchitis    Coronary artery calcification seen on CT scan    a. 05/2021 Cardiac CT: Ca2+ = 108 (69th %'ile).   COVID-19 09/30/2020   Diastolic dysfunction    a. 11/2015 Echo: EF 60-65%, no rwma, GrI DD, nl RV fxn, trive MR/TR, RVSP .   Hemorrhoids    Hyperlipidemia    Hypertension    Scarlet fever    a. as child caused vision issues has to wear glasses now.   Sleep apnea    Syncope    a. 09/2020 Zio: Predominantly sinus rhythm @ 64 (41-203). 21 SVT runs, fastest 203 x 15 beats,  longests 19 beats @ 99. Triggered events not assoc w/ signif arrhythmia.   Patient Active Problem List   Diagnosis Date Noted   Female genital prolapse 06/29/2023   Vitamin B 12 deficiency 06/29/2023   Vitamin D  deficiency 06/29/2023   Hemorrhoids 12/02/2021   Statin intolerance    Prediabetes 11/15/2020   Annual physical exam 04/17/2020   Osteoporosis 01/25/2019   OSA (obstructive sleep apnea) 12/09/2018   Chest pain of uncertain etiology 09/18/2018   Overweight (BMI 25.0-29.9) 06/10/2018   Hyperlipidemia 12/16/2017   Essential hypertension 05/27/2017   Past Surgical History:  Procedure Laterality Date   APPENDECTOMY     age [age]   CARPAL TUNNEL RELEASE     left 2008   COLONOSCOPY WITH PROPOFOL  N/A 09/23/2021   Procedure: COLONOSCOPY WITH PROPOFOL ;  Surgeon: Shane Darling, MD;  Location: [facility] ENDOSCOPY;  Service: Endoscopy;  Laterality: N/A;   OTHER SURGICAL HISTORY     left great toe fusion 2012    Family History  Problem Relation Age of Onset   Other Mother        brain tumor died 20; heavy smoker    Heart attack Father  50s   Breast cancer Neg Hx    Social History   Socioeconomic History   Marital status: Married    Spouse name: Not on file   Number of children: Not on file   Years of education: Not on file   Highest education level: Not on file  Occupational History   Not on file  Tobacco Use   Smoking status: Never   Smokeless tobacco: Never  Vaping Use   Vaping status: Never Used  Substance and Sexual Activity   Alcohol use: No   Drug use: No   Sexual activity: Not Currently    Partners: Male  Other Topics Concern   Not on file  Social History Narrative   Moved from [place] 2018   Mother was [place] she was born in [place] father American soldier never knew    Married    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    Social Drivers of Corporate investment banker Strain: Low Risk  (07/01/2022)    Received from [facility], [facility]   Overall Financial Resource Strain (CARDIA)    Difficulty of Paying Living Expenses: Not hard at all  Food Insecurity: No Food Insecurity (07/01/2022)   Received from [facility], [facility]   Hunger Vital Sign    Worried About Running Out of Food in the Last Year: Never true    Ran Out of Food in the Last Year: Never true  Transportation Needs: No Transportation Needs (07/01/2022)   Received from [facility], [facility]   PRAPARE - Transportation    In the past 12 months, has lack of transportation kept you from medical appointments or from getting medications?: No    Lack of Transportation (Non-Medical): No  Physical Activity: Sufficiently Active (12/04/2019)   Exercise Vital Sign    Days of Exercise per Week: 5 days    Minutes of Exercise per Session: 30 min  Stress: No Stress Concern Present (12/04/2019)   [employer] of Occupational Health - Occupational Stress Questionnaire    Feeling of Stress : Not at all  Social Connections: Unknown (12/04/2019)   Social Connection and Isolation Panel [NHANES]    Frequency of Communication with Friends and Family: Not on file    Frequency of Social Gatherings with Friends and Family: Not on file    Attends Religious Services: Not on file    Active Member of Clubs or Organizations: Not on file    Attends Banker Meetings: Not on file    Marital Status: Married  Intimate Partner Violence: Not At Risk (12/04/2019)   Humiliation, Afraid, Rape, and Kick questionnaire    Fear of Current or Ex-Partner: No    Emotionally Abused: No    Physically Abused: No    Sexually Abused: No   Current Outpatient Medications on File Prior to Visit  Medication Sig Dispense Refill   albuterol  (VENTOLIN  HFA) 108 (90 Base) MCG/ACT inhaler Inhale 1-2 puffs into the lungs every 6 (six) hours as needed. 8  g 0   amLODipine  (NORVASC ) 2.5 MG tablet Take 1 tablet (2.5 mg total) by mouth 2 (two) times daily. 180 tablet 3   aspirin  EC 81 MG tablet Take 1 tablet (81 mg total) by mouth daily. Swallow whole.     metoprolol  succinate (TOPROL -XL) 25 MG 24 hr tablet Take 1 tablet (25 mg total) by  mouth at bedtime. 90 tablet 3   No current facility-administered medications on file prior to visit.   Allergies  Allergen Reactions   Sulfa Antibiotics Itching    Throat tight   Statins Other (See Comments)    Intolerance, multiple side effects   Telmisartan      Heart skipping beats, flank pain and back pain once stopped resolved and nightmares per pt     Zetia  [Ezetimibe ]     Back,kidney pain, muscle aches thighs   Penicillins Rash    Did it involve swelling of the face/tongue/throat, SOB, or low BP? No Did it involve sudden or severe rash/hives, skin peeling, or any reaction on the inside of your mouth or nose? No Did you need to seek medical attention at a hospital or doctor's office? No When did it last happen?      childhood If all above answers are "NO", may proceed with cephalosporin use.   Rosuvastatin  Anxiety and Other (See Comments)    Confusion and sleepiness    Review of Systems Pertinent items are noted in HPI.   Objective:   Blood pressure (!) 160/71, pulse 62, height 4\' 11"  (1.499 m), weight 130 lb (59 kg). Body mass index is 26.26 kg/m.  General appearance: alert and cooperative Abdomen: soft, non-tender; bowel sounds normal; no masses,  no organomegaly Pelvic: cervix normal in appearance, no adnexal masses or tenderness, no cervical motion tenderness, rectovaginal septum normal, uterus normal size, shape, and consistency, and atrophic vulvovaginal mucosa and tissue.  Thorough pelvic exam revealed no cystocele, rectocele or uterine prolapse today. Unable to appreciate vaginal wall bulging in dorsal lithotomy, sitting, standing and with valsalva.  Extremities: extremities normal,  atraumatic, no cyanosis or edema Neurologic: Grossly normal   Assessment/Plan:   1. Vaginal atrophy    Erica Valencia is a 73 y.o. [phone] referred today for concern for pelvic organ prolapse, pelvic exam today without findings to support, so may be reduced and not protruding today; validated patient's perception of the bulge but with vulvovaginal atrophy which may be contributing. We discussed if this progresses and POP is appreciable, would recommend starting with pessary, patient literature provided.  -Start vaginal Estrace, reviewed dosing and SE, will being with twice weekly -Pelvic floor therapy ordered -Work on preventing constipation, can discuss further with PCP; will help prevent excessive valsalva. -BP elevation: precautions for ED follow up discussed; otherwise discuss with PCP and avoid caffeine. -Plan to follow up in 6 mos and assess any progression on her potentially developing POP.  -Follow up sooner if perceiving the pressure/bulge more frequently, externally, or for new symptoms.    Sofia Dunn, DO [facility]

## 2023-10-21 ENCOUNTER — Ambulatory Visit: Payer: Self-pay

## 2023-10-21 ENCOUNTER — Ambulatory Visit
Admission: EM | Admit: 2023-10-21 | Discharge: 2023-10-21 | Disposition: A | Discharge status: Home / Self Care | Attending: Emergency Medicine | Admitting: Emergency Medicine

## 2023-10-21 DIAGNOSIS — J069 Acute upper respiratory infection, unspecified: Secondary | ICD-10-CM

## 2023-10-21 LAB — RESP PANEL BY RT-PCR (FLU A&B, COVID) ARPGX2
Influenza A by PCR: NEGATIVE
Influenza B by PCR: NEGATIVE
SARS Coronavirus 2 by RT PCR: NEGATIVE

## 2023-10-21 LAB — GROUP A STREP BY PCR: Group A Strep by PCR: NOT DETECTED

## 2023-10-21 MED ORDER — IPRATROPIUM BROMIDE 0.06 % NA SOLN: 2.0000 | Freq: Four times a day (QID) | NASAL | 12 refills | Status: DC

## 2023-10-21 MED ORDER — PROMETHAZINE-DM 6.25-15 MG/5ML PO SYRP: 5.0000 mL | ORAL_SOLUTION | Freq: Four times a day (QID) | ORAL | 0 refills | Status: DC | PRN

## 2023-10-21 MED ORDER — BENZONATATE 100 MG PO CAPS: 200.0000 mg | ORAL_CAPSULE | Freq: Three times a day (TID) | ORAL | 0 refills | Status: DC

## 2023-10-21 NOTE — Discharge Instructions (Addendum)
 Your testing today was negative for COVID, influenza, and strep.  I do believe you have a respiratory virus that is causing your symptoms.  You may use over-the-counter analgesia such as Tylenol and/or ibuprofen according to the package instructions as needed for pain or if you develop a fever.  Use the Atrovent nasal spray, 2 squirts in each nostril every 6 hours, as needed for runny nose and postnasal drip.  Use the Tessalon  Perles every 8 hours during the day.  Take them with a small sip of water.  They may give you some numbness to the base of your tongue or a metallic taste in your mouth, this is normal.  Use the Promethazine DM cough syrup at bedtime for cough and congestion.  It will make you drowsy so do not take it during the day.  Return for reevaluation or see your primary care provider for any new or worsening symptoms.

## 2023-10-21 NOTE — Telephone Encounter (Signed)
Pt is going to [place] today

## 2023-10-21 NOTE — ED Provider Notes (Signed)
 [facility]    CSN: [id number] Arrival date & time: 10/21/23  1023      History   Chief Complaint Chief Complaint  Patient presents with   Cough   Sore Throat   Facial Pain   Headache    HPI Erica Valencia is a 73 y.o. female.   HPI  73 year old female with past medical history significant for essential hypertension, hyperlipidemia, OSA, osteoporosis, prediabetes presents for evaluation of 3 days with respiratory symptoms that consist of headache, sweats and subjective fever, sinus pressure, sore throat, and a nonproductive cough.  She denies any shortness of breath or wheezing, GI complaints, body aches, known sick contacts, recent travel out of the state or country.  Past Medical History:  Diagnosis Date   Allergy    Bronchitis    Coronary artery calcification seen on CT scan    a. 05/2021 Cardiac CT: Ca2+ = 108 (69th %'ile).   COVID-19 09/30/2020   Diastolic dysfunction    a. 11/2015 Echo: EF 60-65%, no rwma, GrI DD, nl RV fxn, trive MR/TR, RVSP .   Hemorrhoids    Hyperlipidemia    Hypertension    Scarlet fever    a. as child caused vision issues has to wear glasses now.   Sleep apnea    Syncope    a. 09/2020 Zio: Predominantly sinus rhythm @ 64 (41-203). 21 SVT runs, fastest 203 x 15 beats, longests 19 beats @ 99. Triggered events not assoc w/ signif arrhythmia.    Patient Active Problem List   Diagnosis Date Noted   Vaginal atrophy 09/13/2023   Pelvic pressure in female 09/13/2023   Female genital prolapse 06/29/2023   Vitamin B 12 deficiency 06/29/2023   Vitamin D  deficiency 06/29/2023   Hemorrhoids 12/02/2021   Statin intolerance    Prediabetes 11/15/2020   Annual physical exam 04/17/2020   Osteoporosis 01/25/2019   OSA (obstructive sleep apnea) 12/09/2018   Chest pain of uncertain etiology 09/18/2018   Overweight (BMI 25.0-29.9) 06/10/2018   Hyperlipidemia 12/16/2017   Essential hypertension 05/27/2017    Past Surgical History:   Procedure Laterality Date   APPENDECTOMY     age [age]   CARPAL TUNNEL RELEASE     left 2008   COLONOSCOPY WITH PROPOFOL  N/A 09/23/2021   Procedure: COLONOSCOPY WITH PROPOFOL ;  Surgeon: Shane Darling, MD;  Location: [facility] ENDOSCOPY;  Service: Endoscopy;  Laterality: N/A;   OTHER SURGICAL HISTORY     left great toe fusion 2012     OB History     Gravida  2   Para  2   Term  2   Preterm      AB      Living  2      SAB      IAB      Ectopic      Multiple      Live Births               Home Medications    Prior to Admission medications   Medication Sig Start Date End Date Taking? Authorizing Provider  albuterol  (VENTOLIN  HFA) 108 (90 Base) MCG/ACT inhaler Inhale 1-2 puffs into the lungs every 6 (six) hours as needed. 07/05/23  Yes Angelia Kelp, PA-C  amLODipine  (NORVASC ) 2.5 MG tablet Take 1 tablet (2.5 mg total) by mouth 2 (two) times daily. 06/09/23  Yes Valli Gaw, MD  benzonatate  (TESSALON ) 100 MG capsule Take 2 capsules (200 mg total) by mouth every 8 (eight)  hours. 10/21/23  Yes Kent Pear, NP  ipratropium (ATROVENT) 0.06 % nasal spray Place 2 sprays into both nostrils 4 (four) times daily. 10/21/23  Yes Kent Pear, NP  metoprolol  succinate (TOPROL -XL) 25 MG 24 hr tablet Take 1 tablet (25 mg total) by mouth at bedtime. 06/29/23  Yes Valli Gaw, MD  promethazine-dextromethorphan (PROMETHAZINE-DM) 6.25-15 MG/5ML syrup Take 5 mLs by mouth 4 (four) times daily as needed. 10/21/23  Yes Kent Pear, NP  aspirin  EC 81 MG tablet Take 1 tablet (81 mg total) by mouth daily. Swallow whole. 03/24/23   Florette Hurry, NP  estradiol  (ESTRACE  VAGINAL) 0.1 MG/GM vaginal cream Place 1 Applicatorful vaginally 2 (two) times a week. Place at bedtime. Rub any extra into external tissues. 09/13/23   Sofia Dunn, MD    Family History Family History  Problem Relation Age of Onset   Other Mother        brain tumor died 23; heavy smoker    Heart attack  Father        65s   Breast cancer Neg Hx     Social History Social History   Tobacco Use   Smoking status: Never   Smokeless tobacco: Never  Vaping Use   Vaping status: Never Used  Substance Use Topics   Alcohol use: No   Drug use: No     Allergies   Sulfa antibiotics, Statins, Telmisartan , Zetia  [ezetimibe ], Penicillins, and Rosuvastatin    Review of Systems Review of Systems  Constitutional:  Positive for diaphoresis and fever.  HENT:  Positive for congestion, rhinorrhea and sore throat. Negative for ear pain.   Respiratory:  Positive for cough. Negative for shortness of breath and wheezing.   Gastrointestinal:  Negative for diarrhea, nausea and vomiting.  Musculoskeletal:  Negative for arthralgias and myalgias.  Skin:  Negative for rash.  Neurological:  Positive for headaches.     Physical Exam Triage Vital Signs ED Triage Vitals [10/21/23 1040]  Encounter Vitals Group     BP      Systolic BP Percentile      Diastolic BP Percentile      Pulse      Resp      Temp      Temp src      SpO2      Weight      Height      Head Circumference      Peak Flow      Pain Score 7     Pain Loc      Pain Education      Exclude from Growth Chart    No data found.  Updated Vital Signs BP (!) 156/70 (BP Location: Left Arm)   Pulse 62   Temp 98.4 F (36.9 C) (Oral)   Resp 15   SpO2 99%   Visual Acuity Right Eye Distance:   Left Eye Distance:   Bilateral Distance:    Right Eye Near:   Left Eye Near:    Bilateral Near:     Physical Exam Vitals and nursing note reviewed.  Constitutional:      Appearance: Normal appearance. She is not ill-appearing.  HENT:     Head: Normocephalic and atraumatic.     Right Ear: Tympanic membrane, ear canal and external ear normal. There is no impacted cerumen.     Left Ear: Tympanic membrane, ear canal and external ear normal. There is no impacted cerumen.     Nose: Congestion and rhinorrhea present.  Comments: Nasal  mucosa is edematous erythematous with clear discharge in both nares.    Mouth/Throat:     Mouth: Mucous membranes are moist.     Pharynx: Posterior oropharyngeal erythema present. No oropharyngeal exudate.     Comments: Erythema to the soft palate and bilateral tonsillar pillars.  No appreciable exudate. Cardiovascular:     Rate and Rhythm: Normal rate and regular rhythm.     Pulses: Normal pulses.     Heart sounds: Normal heart sounds. No murmur heard.    No friction rub. No gallop.  Pulmonary:     Effort: Pulmonary effort is normal.     Breath sounds: Normal breath sounds. No wheezing, rhonchi or rales.  Musculoskeletal:     Cervical back: Normal range of motion and neck supple. No tenderness.  Lymphadenopathy:     Cervical: No cervical adenopathy.  Skin:    General: Skin is warm and dry.     Capillary Refill: Capillary refill takes less than 2 seconds.     Findings: No rash.  Neurological:     General: No focal deficit present.     Mental Status: She is alert and oriented to person, place, and time.      UC Treatments / Results  Labs (all labs ordered are listed, but only abnormal results are displayed) Labs Reviewed  GROUP A STREP BY PCR  RESP PANEL BY RT-PCR (FLU A&B, COVID) [id number]    EKG   Radiology No results found.  Procedures Procedures (including critical care time)  Medications Ordered in UC Medications - No data to display  Initial Impression / Assessment and Plan / UC Course  I have reviewed the triage vital signs and the nursing notes.  Pertinent labs & imaging results that were available during my care of the patient were reviewed by me and considered in my medical decision making (see chart for details).   Patient is a pleasant, nontoxic-appearing 73 year old female presenting for evaluation of 3 days with respiratory symptoms as outlined HPI above.  Exam does reveal inflammation of her upper respiratory tract as evidenced by of the nasal mucosa  with clear rhinorrhea.  She also is edematous and erythematous tonsillar pillars with erythema to her soft palate without exudate.  Cardiopulmonary exam reveals clear lung sounds in all fields.  Differential diagnose include COVID, influenza, strep pharyngitis, viral respiratory illness.  I will order a COVID and flu PCR as well as a strep PCR.  Strep PCR is negative.  Respiratory panel is negative for COVID or influenza.  I will discharge patient with a diagnosis of viral URI with a cough.  I will prescribe Atrovent nasal spray double nasal congestion with Tessalon  Perles but sent him cough syrup for cough and congestion.  Return precautions reviewed.   Final Clinical Impressions(s) / UC Diagnoses   Final diagnoses:  Viral URI with cough     Discharge Instructions      Your testing today was negative for COVID, influenza, and strep.  I do believe you have a respiratory virus that is causing your symptoms.  You may use over-the-counter analgesia such as Tylenol and/or ibuprofen according to the package instructions as needed for pain or if you develop a fever.  Use the Atrovent nasal spray, 2 squirts in each nostril every 6 hours, as needed for runny nose and postnasal drip.  Use the Tessalon  Perles every 8 hours during the day.  Take them with a small sip of water.  They may give you  some numbness to the base of your tongue or a metallic taste in your mouth, this is normal.  Use the Promethazine DM cough syrup at bedtime for cough and congestion.  It will make you drowsy so do not take it during the day.  Return for reevaluation or see your primary care provider for any new or worsening symptoms.    ED Prescriptions     Medication Sig Dispense Auth. Provider   benzonatate  (TESSALON ) 100 MG capsule Take 2 capsules (200 mg total) by mouth every 8 (eight) hours. 21 capsule Kent Pear, NP   ipratropium (ATROVENT) 0.06 % nasal spray Place 2 sprays into both nostrils 4 (four) times  daily. 15 mL Kent Pear, NP   promethazine-dextromethorphan (PROMETHAZINE-DM) 6.25-15 MG/5ML syrup Take 5 mLs by mouth 4 (four) times daily as needed. 118 mL Kent Pear, NP      PDMP not reviewed this encounter.   Kent Pear, NP 10/21/23 1149

## 2023-10-21 NOTE — ED Triage Notes (Signed)
 Sx x 3 days  Cough Sore throat Sinus pressure Headache Chest congestion

## 2023-10-21 NOTE — Telephone Encounter (Signed)
 FYI Only or Action Required?: Action required by provider  Patient was last seen in primary care on 06/29/2023 by Valli Gaw, MD. Called Nurse Triage reporting Cough, chest sore from coughing, ear pain. Symptoms began yesterday. Interventions attempted: Nothing. Symptoms are: gradually worsening.  Triage Disposition: See Physician Within 24 Hours  Patient/caregiver understands and will follow disposition?: Yes, going to UC due to no availability.            Copied from CRM [id number]. Topic: Clinical - Red Word Triage >> Oct 21, 2023  8:29 AM Jenice Mitts wrote: Red Word that prompted transfer to Nurse Triage: Chest pain   Patient stated she has been having chest pain, a deep cough and a low grade fever Reason for Disposition  [1] Continuous (nonstop) coughing interferes with work or school AND [2] no improvement using cough treatment per Care Advice  Answer Assessment - Initial Assessment Questions 1. ONSET: "When did the cough begin?"      2 days ago 2. SEVERITY: "How bad is the cough today?"      severe 3. SPUTUM: "Describe the color of your sputum" (none, dry cough; clear, white, yellow, green)     none 4. HEMOPTYSIS: "Are you coughing up any blood?" If so ask: "How much?" (flecks, streaks, tablespoons, etc.)     no 5. DIFFICULTY BREATHING: "Are you having difficulty breathing?" If Yes, ask: "How bad is it?" (e.g., mild, moderate, severe)    - MILD: No SOB at rest, mild SOB with walking, speaks normally in sentences, can lie down, no retractions, pulse < 100.    - MODERATE: SOB at rest, SOB with minimal exertion and prefers to sit, cannot lie down flat, speaks in phrases, mild retractions, audible wheezing, pulse 100-120.    - SEVERE: Very SOB at rest, speaks in single words, struggling to breathe, sitting hunched forward, retractions, pulse > 120      no 6. FEVER: "Do you have a fever?" If Yes, ask: "What is your temperature, how was it measured, and when did it start?"      Last night -100.1 7. CARDIAC HISTORY: "Do you have any history of heart disease?" (e.g., heart attack, congestive heart failure)      HTN 8. LUNG HISTORY: "Do you have any history of lung disease?"  (e.g., pulmonary embolus, asthma, emphysema)     NO 9. PE RISK FACTORS: "Do you have a history of blood clots?" (or: recent major surgery, recent prolonged travel, bedridden)     no 10. OTHER SYMPTOMS: "Do you have any other symptoms?" (e.g., runny nose, wheezing, chest pain)       Ear pain 11. PREGNANCY: "Is there any chance you are pregnant?" "When was your last menstrual period?"       no 12. TRAVEL: "Have you traveled out of the country in the last month?" (e.g., travel history, exposures)       no  Protocols used: Cough - Acute Non-Productive-A-AH

## 2023-10-28 ENCOUNTER — Ambulatory Visit
Admission: EM | Admit: 2023-10-28 | Discharge: 2023-10-28 | Disposition: A | Discharge status: Home / Self Care | Attending: Emergency Medicine | Admitting: Emergency Medicine

## 2023-10-28 DIAGNOSIS — J069 Acute upper respiratory infection, unspecified: Secondary | ICD-10-CM

## 2023-10-28 MED ORDER — PREDNISONE 20 MG PO TABS: 40.0000 mg | ORAL_TABLET | Freq: Every day | ORAL | 0 refills | Status: DC

## 2023-10-28 MED ORDER — GUAIFENESIN-CODEINE 100-10 MG/5ML PO SOLN: 5.0000 mL | Freq: Every evening | ORAL | 0 refills | Status: DC | PRN

## 2023-10-28 MED ORDER — AZITHROMYCIN 250 MG PO TABS: 250.0000 mg | ORAL_TABLET | Freq: Every day | ORAL | 0 refills | Status: DC

## 2023-10-28 NOTE — ED Provider Notes (Signed)
 Erica Valencia    CSN: [id number] Arrival date & time: 10/28/23  1021      History   Chief Complaint Chief Complaint  Patient presents with   Cough   Nasal Congestion    HPI Erica Valencia is a 73 y.o. female.   Patient presents for evaluation of nasal congestion, rhinorrhea, nonproductive cough, sore throat only from coughing, bilateral ear fullness, intermittent sinus headaches present for 2-1/2 weeks.  Denies fever, shortness of breath or wheezing.  Was evaluated in [facility] urgent [facility] on 10/21/2023, prescribed Tessalon  and Promethazine  DM which has provided no relief additionally has attempted use of an expectorant.  Past Medical History:  Diagnosis Date   Allergy    Bronchitis    Coronary artery calcification seen on CT scan    a. 05/2021 Cardiac CT: Ca2+ = 108 (69th %'ile).   COVID-19 09/30/2020   Diastolic dysfunction    a. 11/2015 Echo: EF 60-65%, no rwma, GrI DD, nl RV fxn, trive MR/TR, RVSP .   Hemorrhoids    Hyperlipidemia    Hypertension    Scarlet fever    a. as child caused vision issues has to wear glasses now.   Sleep apnea    Syncope    a. 09/2020 Zio: Predominantly sinus rhythm @ 64 (41-203). 21 SVT runs, fastest 203 x 15 beats, longests 19 beats @ 99. Triggered events not assoc w/ signif arrhythmia.    Patient Active Problem List   Diagnosis Date Noted   Vaginal atrophy 09/13/2023   Pelvic pressure in female 09/13/2023   Female genital prolapse 06/29/2023   Vitamin B 12 deficiency 06/29/2023   Vitamin D  deficiency 06/29/2023   Hemorrhoids 12/02/2021   Statin intolerance    Prediabetes 11/15/2020   Annual physical exam 04/17/2020   Osteoporosis 01/25/2019   OSA (obstructive sleep apnea) 12/09/2018   Chest pain of uncertain etiology 09/18/2018   Overweight (BMI 25.0-29.9) 06/10/2018   Hyperlipidemia 12/16/2017   Essential hypertension 05/27/2017    Past Surgical History:  Procedure Laterality Date   APPENDECTOMY     age [age]    CARPAL TUNNEL RELEASE     left 2008   COLONOSCOPY WITH PROPOFOL  N/A 09/23/2021   Procedure: COLONOSCOPY WITH PROPOFOL ;  Surgeon: Shane Darling, MD;  Location: [facility] ENDOSCOPY;  Service: Endoscopy;  Laterality: N/A;   OTHER SURGICAL HISTORY     left great toe fusion 2012     OB History     Gravida  2   Para  2   Term  2   Preterm      AB      Living  2      SAB      IAB      Ectopic      Multiple      Live Births               Home Medications    Prior to Admission medications   Medication Sig Start Date End Date Taking? Authorizing Provider  amLODipine  (NORVASC ) 2.5 MG tablet Take 1 tablet (2.5 mg total) by mouth 2 (two) times daily. 06/09/23  Yes Valli Gaw, MD  aspirin  EC 81 MG tablet Take 1 tablet (81 mg total) by mouth daily. Swallow whole. 03/24/23  Yes Florette Hurry, NP  azithromycin  (ZITHROMAX ) 250 MG tablet Take 1 tablet (250 mg total) by mouth daily. Take first 2 tablets together, then 1 every day until finished. 10/28/23  Yes Reena Canning, NP  benzonatate  (TESSALON ) 100 MG capsule Take 2 capsules (200 mg total) by mouth every 8 (eight) hours. 10/21/23  Yes Kent Pear, NP  guaiFENesin-codeine 100-10 MG/5ML syrup Take 5 mLs by mouth at bedtime as needed for cough. 10/28/23  Yes Glendia Olshefski R, NP  ipratropium (ATROVENT ) 0.06 % nasal spray Place 2 sprays into both nostrils 4 (four) times daily. 10/21/23  Yes Kent Pear, NP  metoprolol  succinate (TOPROL -XL) 25 MG 24 hr tablet Take 1 tablet (25 mg total) by mouth at bedtime. 06/29/23  Yes Valli Gaw, MD  predniSONE (DELTASONE) 20 MG tablet Take 2 tablets (40 mg total) by mouth daily. 10/28/23  Yes Jhan Conery, Maybelle Spatz, NP  promethazine -dextromethorphan (PROMETHAZINE -DM) 6.25-15 MG/5ML syrup Take 5 mLs by mouth 4 (four) times daily as needed. 10/21/23  Yes Kent Pear, NP  albuterol  (VENTOLIN  HFA) 108 (90 Base) MCG/ACT inhaler Inhale 1-2 puffs into the lungs every 6 (six) hours as needed.  07/05/23   Angelia Kelp, PA-C  estradiol  (ESTRACE  VAGINAL) 0.1 MG/GM vaginal cream Place 1 Applicatorful vaginally 2 (two) times a week. Place at bedtime. Rub any extra into external tissues. 09/13/23   Sofia Dunn, MD    Family History Family History  Problem Relation Age of Onset   Other Mother        brain tumor died 59; heavy smoker    Heart attack Father        29s   Breast cancer Neg Hx     Social History Social History   Tobacco Use   Smoking status: Never   Smokeless tobacco: Never  Vaping Use   Vaping status: Never Used  Substance Use Topics   Alcohol use: No   Drug use: No     Allergies   Sulfa antibiotics, Statins, Telmisartan , Zetia  [ezetimibe ], Penicillins, and Rosuvastatin    Review of Systems Review of Systems   Physical Exam Triage Vital Signs ED Triage Vitals [10/28/23 1052]  Encounter Vitals Group     BP 128/80     Girls Systolic BP Percentile      Girls Diastolic BP Percentile      Boys Systolic BP Percentile      Boys Diastolic BP Percentile      Pulse Rate 74     Resp 16     Temp 98.2 F (36.8 C)     Temp Source Oral     SpO2 97 %     Weight      Height      Head Circumference      Peak Flow      Pain Score 0     Pain Loc      Pain Education      Exclude from Growth Chart    No data found.  Updated Vital Signs BP 128/80 (BP Location: Left Arm)   Pulse 74   Temp 98.2 F (36.8 C) (Oral)   Resp 16   SpO2 97%   Visual Acuity Right Eye Distance:   Left Eye Distance:   Bilateral Distance:    Right Eye Near:   Left Eye Near:    Bilateral Near:     Physical Exam Constitutional:      Appearance: Normal appearance.  HENT:     Right Ear: Tympanic membrane, ear canal and external ear normal.     Left Ear: Tympanic membrane, ear canal and external ear normal.     Nose: Congestion present.     Mouth/Throat:     Mouth: Mucous membranes  are moist.     Pharynx: Oropharynx is clear. No oropharyngeal exudate or posterior  oropharyngeal erythema.   Eyes:     Extraocular Movements: Extraocular movements intact.    Cardiovascular:     Rate and Rhythm: Normal rate and regular rhythm.     Pulses: Normal pulses.     Heart sounds: Normal heart sounds.  Pulmonary:     Effort: Pulmonary effort is normal.     Breath sounds: Normal breath sounds.     Comments: Deep Congested cough witnessed  Neurological:     Mental Status: She is alert and oriented to person, place, and time. Mental status is at baseline.      UC Treatments / Results  Labs (all labs ordered are listed, but only abnormal results are displayed) Labs Reviewed - No data to display  EKG   Radiology No results found.  Procedures Procedures (including critical care time)  Medications Ordered in UC Medications - No data to display  Initial Impression / Assessment and Plan / UC Course  I have reviewed the triage vital signs and the nursing notes.  Pertinent labs & imaging results that were available during my care of the patient were reviewed by me and considered in my medical decision making (see chart for details).  Acute URI  Patient is in no signs of distress nor toxic appearing.  Vital signs are stable.  Low suspicion for pneumonia, pneumothorax or bronchitis and therefore will defer imaging.  Viral testing completed on 10/21/2023, negative.  Symptoms persisting for 2-1/2 weeks will place on antibiotics, prescribed azithromycin  additionally prescribed prednisone and guaifenesin codeine for bedtime for management of cough.May use additional over-the-counter medications as needed for supportive care.  May follow-up with urgent care as needed if symptoms persist or worsen.  Final Clinical Impressions(s) / UC Diagnoses   Final diagnoses:  Acute URI     Discharge Instructions      Begin azithromycin  for coverage of bacteria  Begin prednisone every morning with food to open and relax airway ideally will help settle deepness of  cough  May use cough syrup at bedtime to allow for rest    You can take Tylenol and/or Ibuprofen as needed for fever reduction and pain relief.   For cough: honey 1/2 to 1 teaspoon (you can dilute the honey in water or another fluid).  You can also use guaifenesin and dextromethorphan for cough. You can use a humidifier for chest congestion and cough.  If you don't have a humidifier, you can sit in the bathroom with the hot shower running.      For sore throat: try warm salt water gargles, cepacol lozenges, throat spray, warm tea or water with lemon/honey, popsicles or ice, or OTC cold relief medicine for throat discomfort.   For congestion: take a daily anti-histamine like Zyrtec, Claritin, and a oral decongestant, such as pseudoephedrine.  You can also use Flonase 1-2 sprays in each nostril daily.   It is important to stay hydrated: drink plenty of fluids (water, gatorade/powerade/pedialyte, juices, or teas) to keep your throat moisturized and help further relieve irritation/discomfort.    ED Prescriptions     Medication Sig Dispense Auth. Provider   azithromycin  (ZITHROMAX ) 250 MG tablet Take 1 tablet (250 mg total) by mouth daily. Take first 2 tablets together, then 1 every day until finished. 6 tablet Kaylon Hitz R, NP   predniSONE (DELTASONE) 20 MG tablet Take 2 tablets (40 mg total) by mouth daily. 10 tablet [place], Terrisa Curfman  R, NP   guaiFENesin-codeine 100-10 MG/5ML syrup Take 5 mLs by mouth at bedtime as needed for cough. 120 mL Delawrence Fridman, Maybelle Spatz, NP      I have reviewed the PDMP during this encounter.   Reena Canning, NP 10/28/23 1117

## 2023-10-28 NOTE — Discharge Instructions (Signed)
 Begin azithromycin  for coverage of bacteria  Begin prednisone every morning with food to open and relax airway ideally will help settle deepness of cough  May use cough syrup at bedtime to allow for rest    You can take Tylenol and/or Ibuprofen as needed for fever reduction and pain relief.   For cough: honey 1/2 to 1 teaspoon (you can dilute the honey in water or another fluid).  You can also use guaifenesin and dextromethorphan for cough. You can use a humidifier for chest congestion and cough.  If you don't have a humidifier, you can sit in the bathroom with the hot shower running.      For sore throat: try warm salt water gargles, cepacol lozenges, throat spray, warm tea or water with lemon/honey, popsicles or ice, or OTC cold relief medicine for throat discomfort.   For congestion: take a daily anti-histamine like Zyrtec, Claritin, and a oral decongestant, such as pseudoephedrine.  You can also use Flonase 1-2 sprays in each nostril daily.   It is important to stay hydrated: drink plenty of fluids (water, gatorade/powerade/pedialyte, juices, or teas) to keep your throat moisturized and help further relieve irritation/discomfort.

## 2023-10-28 NOTE — ED Triage Notes (Signed)
 Cough, congestion, ear fullness x 2.5 weeks. Pt was seen on 6/5 at [facility] urgent care and pt states she took the prescribed medications and states it did not help to much and still having the cough epically at night when trying to sleep.

## 2023-11-25 ENCOUNTER — Telehealth: Payer: Self-pay

## 2023-11-25 NOTE — Telephone Encounter (Signed)
 [name] states call dropped while on hold.  [name] states patient thought she was scheduled for a TOC appointment with Dr. Luke Shade on 12/27/2023 and would like to have her labs drawn prior to her visit.  I let them know that patient is scheduled for an office visit for a 60-month follow-up, not a TOC visit.  I asked them to hold while I reached out to [facility], CMA, to find out if Dr. Shade would be willing to switch this visit to a TOC appointment.  [name] states it's ok to change this appointment to a TOC appointment.  When I relayed the message to [name], we discovered patient was no longer on the phone.  I called patient and left a voicemail letting her know that I received permission to change her appointment on 12/27/2023 from an office visit to a transfer of care appointment with Dr. Shade.  I let patient know that Dr. Shade will need to see her before ordering labs.  I noticed that a lab visit has now been scheduled, so I let patient know that we will need to cancel that appointment, since Dr. Shade will need to see her before ordering labs.  I asked patient to please call us  if she has any questions.

## 2023-12-21 ENCOUNTER — Other Ambulatory Visit

## 2023-12-27 ENCOUNTER — Ambulatory Visit: Payer: Self-pay

## 2023-12-27 ENCOUNTER — Ambulatory Visit (INDEPENDENT_AMBULATORY_CARE_PROVIDER_SITE_OTHER)

## 2023-12-27 VITALS — BP 122/66 | HR 57 | Temp 98.1°F | Resp 20 | Ht 59.0 in | Wt 130.1 lb

## 2023-12-27 DIAGNOSIS — E538 Deficiency of other specified B group vitamins: Secondary | ICD-10-CM | POA: Diagnosis not present

## 2023-12-27 DIAGNOSIS — E782 Mixed hyperlipidemia: Secondary | ICD-10-CM

## 2023-12-27 DIAGNOSIS — Z789 Other specified health status: Secondary | ICD-10-CM

## 2023-12-27 DIAGNOSIS — E559 Vitamin D deficiency, unspecified: Secondary | ICD-10-CM | POA: Diagnosis not present

## 2023-12-27 DIAGNOSIS — R7303 Prediabetes: Secondary | ICD-10-CM

## 2023-12-27 DIAGNOSIS — M810 Age-related osteoporosis without current pathological fracture: Secondary | ICD-10-CM

## 2023-12-27 DIAGNOSIS — I10 Essential (primary) hypertension: Secondary | ICD-10-CM

## 2023-12-27 DIAGNOSIS — K649 Unspecified hemorrhoids: Secondary | ICD-10-CM

## 2023-12-27 LAB — COMPREHENSIVE METABOLIC PANEL WITH GFR
ALT: 14 U/L (ref 0–35)
AST: 18 U/L (ref 0–37)
Albumin: 4.4 g/dL (ref 3.5–5.2)
Alkaline Phosphatase: 65 U/L (ref 39–117)
BUN: 12 mg/dL (ref 6–23)
CO2: 29 meq/L (ref 19–32)
Calcium: 9.2 mg/dL (ref 8.4–10.5)
Chloride: 103 meq/L (ref 96–112)
Creatinine, Ser: 0.57 mg/dL (ref 0.40–1.20)
GFR: 89.35 mL/min (ref >60.00)
Glucose, Bld: 89 mg/dL (ref 70–99)
Potassium: 4.1 meq/L (ref 3.5–5.1)
Sodium: 140 meq/L (ref 135–145)
Total Bilirubin: 0.5 mg/dL (ref 0.2–1.2)
Total Protein: 6.8 g/dL (ref 6.0–8.3)

## 2023-12-27 LAB — VITAMIN D 25 HYDROXY (VIT D DEFICIENCY, FRACTURES): VITD: 33.81 ng/mL (ref 30.00–100.00)

## 2023-12-27 LAB — HEMOGLOBIN A1C: Hgb A1c MFr Bld: 5.9 % (ref 4.6–6.5)

## 2023-12-27 LAB — LIPID PANEL
Cholesterol: 208 mg/dL — ABNORMAL HIGH (ref 0–200)
HDL: 66.5 mg/dL (ref >39.00)
LDL Cholesterol: 122 mg/dL — ABNORMAL HIGH (ref 0–99)
NonHDL: 141.8
Total CHOL/HDL Ratio: 3
Triglycerides: 101 mg/dL (ref 0.0–149.0)
VLDL: 20.2 mg/dL (ref 0.0–40.0)

## 2023-12-27 LAB — CBC WITH DIFFERENTIAL/PLATELET
Basophils Absolute: 0 K/uL (ref 0.0–0.1)
Basophils Relative: 0.5 % (ref 0.0–3.0)
Eosinophils Absolute: 0.1 K/uL (ref 0.0–0.7)
Eosinophils Relative: 0.8 % (ref 0.0–5.0)
HCT: 42.5 % (ref 36.0–46.0)
Hemoglobin: 14.4 g/dL (ref 12.0–15.0)
Lymphocytes Relative: 26 % (ref 12.0–46.0)
Lymphs Abs: 1.8 K/uL (ref 0.7–4.0)
MCHC: 33.9 g/dL (ref 30.0–36.0)
MCV: 84.6 fl (ref 78.0–100.0)
Monocytes Absolute: 0.6 K/uL (ref 0.1–1.0)
Monocytes Relative: 7.8 % (ref 3.0–12.0)
Neutro Abs: 4.6 K/uL (ref 1.4–7.7)
Neutrophils Relative %: 64.9 % (ref 43.0–77.0)
Platelets: 265 K/uL (ref 150.0–400.0)
RBC: 5.02 Mil/uL (ref 3.87–5.11)
RDW: 13.5 % (ref 11.5–15.5)
WBC: 7.1 K/uL (ref 4.0–10.5)

## 2023-12-27 LAB — VITAMIN B12: Vitamin B-12: 396 pg/mL (ref 211–911)

## 2023-12-27 MED ORDER — METOPROLOL SUCCINATE ER 25 MG PO TB24: 25.0000 mg | ORAL_TABLET | Freq: Every day | ORAL | 3 refills | Status: AC

## 2023-12-27 MED ORDER — AMLODIPINE BESYLATE 2.5 MG PO TABS: 2.5000 mg | ORAL_TABLET | Freq: Two times a day (BID) | ORAL | 3 refills | Status: DC

## 2023-12-27 MED ORDER — ATORVASTATIN CALCIUM 10 MG PO TABS: 10.0000 mg | ORAL_TABLET | ORAL | 1 refills | Status: AC

## 2023-12-27 NOTE — Assessment & Plan Note (Signed)
 Check vitamin D  level

## 2023-12-27 NOTE — Assessment & Plan Note (Signed)
 On Aspirin  81 mg, every other day. Not on statin due to s/e from Rosuvastatin  (body pain in the past).  Repeat lipid panel today.  If 10 yrs ASCVD risk >10% recommend trial of Atorvastatin  10 mg, every other night. If started on Atorvastatin , recommend f/u in 3 months for repeat lipid panel, hepatic function test. If patient declines starting statin f/u in 6 months.  Continue healthy lifestyle (moderate intensity exercise x 5 days a week with 2-3 days of strengthening exercise), continue healthy eating habits.

## 2023-12-27 NOTE — Assessment & Plan Note (Signed)
 Plan per mixed hyperlipidemia

## 2023-12-27 NOTE — Assessment & Plan Note (Signed)
 Internal/external hemorrhoids as evident on colonoscopy from 09/23/2021.  Counseled on increasing daily fiber intake to 25 gm/day, increase daily water intake to 2L/day.  Continue regular exercise. Avoid constipation given h/o pelvic organ prolapse.

## 2023-12-27 NOTE — Assessment & Plan Note (Signed)
 BP within goal today.  On Amlodipine  2.5 mg BID instead of 5 mg daily due to palpitations on 5 mg once daily dose.  Continue Amlodipine  2.5 mg BID, Metoprolol  XL 25 mg daily.  Continue home BP check, goal Bp <130/80 mmHg. Reach out to clinic if BP above goal.

## 2023-12-27 NOTE — Patient Instructions (Addendum)
 To schedule for bone scan call:  [facility] - call [phone]

## 2023-12-27 NOTE — Progress Notes (Signed)
 Established Patient Office Visit TOC from Dr. Hope    Subjective  Patient ID: Erica Valencia, female    DOB: 1951-04-14  Age: 73 y.o. MRN: [id number]  Chief Complaint  Patient presents with   Establish Care    Transferring from Dr. Hope     She  has a past medical history of Allergy, Bronchitis, Chest pain of uncertain etiology (09/18/2018), Coronary artery calcification seen on CT scan, COVID-19 (09/30/2020), Diastolic dysfunction, Hemorrhoids, Hyperlipidemia, Hypertension, Scarlet fever, Sleep apnea, and Syncope.  HPI Discussed the use of AI scribe software for clinical note transcription with the patient, who gave verbal consent to proceed.  History of Present Illness Erica Valencia is a 73 year old female with hypertension who presents to establish care.   Hypertension, mixed hyperlipidemia with h/o statin intolerance:  - She manages her hypertension with amlodipine  2.5 mg twice daily, adjusted from 5 mg once daily due to palpitations. She also takes metoprolol  XL 25 mg at night and aspirin  81 mg every other day.   - She has a history of elevated cholesterol (LDL). She previously tried statins/Rosuvastatin  but experienced body aches. She has also tried Zetia  but does not recall exact s/e to this. She is not fasting today. She eats mostly homemade food. She maintains an active lifestyle, walking daily and using an elliptical machine when unable to go outside. She aims for 7,000 steps a day and engages in strength training with light weights.  - She experiences postmenopausal vaginal dryness and discontinued estrogen cream due to burning sensations. She has a history of pelvic organ prolapse and is scheduled for pelvic floor physical therapy in September. She performs Kegel exercises at home. She has seen Dr. Leigh for this who recommended f/u in 6 months during visit in April. Patient plans on scheduling f/u appointment with her.   - She has a history of bronchitis, especially with  seasonal changes, and uses an albuterol  inhaler as needed.   - Her past medical history includes a borderline bone density scan from April 2023., needs repeat DEXA.   -  She had a colonoscopy in May 2023, which showed no polyps but did reveal external and internal hemorrhoids. Denies unintentional weight loss, blood in stool, fever, chills.   ROS As per HPI    Objective:     BP 122/66   Pulse (!) 57   Temp 98.1 F (36.7 C)   Resp 20   Ht 4' 11 (1.499 m)   Wt 130 lb 2 oz (59 kg)   SpO2 97%   BMI 26.28 kg/m      12/27/2023    1:03 PM 01/04/2023    1:32 PM 09/02/2022    3:31 PM  Depression screen PHQ 2/9  Decreased Interest 0 0 0  Down, Depressed, Hopeless 0 0 0  PHQ - 2 Score 0 0 0  Altered sleeping 0 0 0  Tired, decreased energy 0 0 0  Change in appetite 0 0 0  Feeling bad or failure about yourself  0 0 0  Trouble concentrating 0 0 0  Moving slowly or fidgety/restless 0 0 0  Suicidal thoughts 0 0 0  PHQ-9 Score 0 0 0  Difficult doing work/chores Not difficult at all Not difficult at all Not difficult at all      12/27/2023    1:03 PM 01/04/2023    1:32 PM 09/02/2022    3:31 PM 08/04/2022    3:19 PM  GAD 7 : Generalized Anxiety Score  Nervous, Anxious, on Edge 0 0 0 0  Control/stop worrying 0 0 0 0  Worry too much - different things 0 0 0 0  Trouble relaxing 0 0 0 0  Restless 0 0 0 0  Easily annoyed or irritable 0 0 0 0  Afraid - awful might happen 0 0 0 0  Total GAD 7 Score 0 0 0 0  Anxiety Difficulty Not difficult at all Not difficult at all Not difficult at all Not difficult at all      12/27/2023    1:03 PM 01/04/2023    1:32 PM 09/02/2022    3:31 PM  Depression screen PHQ 2/9  Decreased Interest 0 0 0  Down, Depressed, Hopeless 0 0 0  PHQ - 2 Score 0 0 0  Altered sleeping 0 0 0  Tired, decreased energy 0 0 0  Change in appetite 0 0 0  Feeling bad or failure about yourself  0 0 0  Trouble concentrating 0 0 0  Moving slowly or fidgety/restless 0 0 0   Suicidal thoughts 0 0 0  PHQ-9 Score 0 0 0  Difficult doing work/chores Not difficult at all Not difficult at all Not difficult at all      12/27/2023    1:03 PM 01/04/2023    1:32 PM 09/02/2022    3:31 PM 08/04/2022    3:19 PM  GAD 7 : Generalized Anxiety Score  Nervous, Anxious, on Edge 0 0 0 0  Control/stop worrying 0 0 0 0  Worry too much - different things 0 0 0 0  Trouble relaxing 0 0 0 0  Restless 0 0 0 0  Easily annoyed or irritable 0 0 0 0  Afraid - awful might happen 0 0 0 0  Total GAD 7 Score 0 0 0 0  Anxiety Difficulty Not difficult at all Not difficult at all Not difficult at all Not difficult at all   SDOH Screenings   Food Insecurity: No Food Insecurity (07/01/2022)   Received from [facility]  Housing: Low Risk  (12/04/2019)  Transportation Needs: No Transportation Needs (07/01/2022)   Received from [facility]  Utilities: Not At Risk (07/01/2022)   Received from [facility]  Depression [phone]): Low Risk  (12/27/2023)  Financial Resource Strain: Low Risk  (07/01/2022)   Received from [facility]  Physical Activity: Sufficiently Active (12/04/2019)  Social Connections: Unknown (12/04/2019)  Stress: No Stress Concern Present (12/04/2019)  Tobacco Use: Low Risk  (12/27/2023)     Physical Exam Constitutional:      General: She is not in acute distress.    Appearance: Normal appearance.  HENT:     Head: Normocephalic and atraumatic.     Right Ear: Tympanic membrane normal.     Left Ear: Tympanic membrane normal.     Mouth/Throat:     Mouth: Mucous membranes are moist.  Neck:     Thyroid : No thyroid  mass or thyroid  tenderness.  Cardiovascular:     Rate and Rhythm: Normal rate and regular rhythm.     Heart sounds: No murmur heard. Pulmonary:     Effort: Pulmonary effort is normal.     Breath sounds: Normal breath sounds.  Abdominal:     General: Bowel sounds are normal.     Palpations:  Abdomen is soft.     Tenderness: There is no guarding or rebound.  Musculoskeletal:     Cervical back: Neck supple. No rigidity.  Right lower leg: No edema.     Left lower leg: No edema.  Skin:    General: Skin is warm.  Neurological:     Mental Status: She is alert and oriented to person, place, and time.  Psychiatric:        Mood and Affect: Mood normal.        Behavior: Behavior normal.        No results found for any visits on 12/27/23.  The 10-year ASCVD risk score (Arnett DK, et al., 2019) is: 17.3%     Assessment & Plan:   Essential hypertension Assessment & Plan: BP within goal today.  On Amlodipine  2.5 mg BID instead of 5 mg daily due to palpitations on 5 mg once daily dose.  Continue Amlodipine  2.5 mg BID, Metoprolol  XL 25 mg daily.  Continue home BP check, goal Bp <130/80 mmHg. Reach out to clinic if BP above goal.    Orders: -     amLODIPine  Besylate; Take 1 tablet (2.5 mg total) by mouth 2 (two) times daily.  Dispense: 180 tablet; Refill: 3 -     Metoprolol  Succinate ER; Take 1 tablet (25 mg total) by mouth at bedtime.  Dispense: 90 tablet; Refill: 3 -     Comprehensive metabolic panel with GFR  Vitamin B 12 deficiency Assessment & Plan: H/O b 12 deficiency in the past with elevated serum B 12 level when she was taking B 12 supplement. Recommend repeat B 12 now that she is not taking supplement.   Orders: -     Vitamin B12 -     CBC with Differential/Platelet  Vitamin D  deficiency Assessment & Plan: Check vitamin D  level.   Orders: -     VITAMIN D  25 Hydroxy (Vit-D Deficiency, Fractures) -     CBC with Differential/Platelet  Mixed hyperlipidemia Assessment & Plan: On Aspirin  81 mg, every other day. Not on statin due to s/e from Rosuvastatin  (body pain in the past).  Repeat lipid panel today.  If 10 yrs ASCVD risk >10% recommend trial of Atorvastatin  10 mg, every other night. If started on Atorvastatin , recommend f/u in 3 months for repeat  lipid panel, hepatic function test. If patient declines starting statin f/u in 6 months.  Continue healthy lifestyle (moderate intensity exercise x 5 days a week with 2-3 days of strengthening exercise), continue healthy eating habits.   Orders: -     Lipid panel  Prediabetes Assessment & Plan: Check A1c   Orders: -     Hemoglobin A1c  Statin intolerance Assessment & Plan: Plan per mixed hyperlipidemia    Osteoporosis, unspecified osteoporosis type, unspecified pathological fracture presence Assessment & Plan: Chronic.  Previously on Prolia, not currently on calcium  or bisphosphonate treatment. Repeat DEXA ordered. Management pending results.     Orders: -     DG Bone Density; Future  Hemorrhoids, unspecified hemorrhoid type Assessment & Plan: Internal/external hemorrhoids as evident on colonoscopy from 09/23/2021.  Counseled on increasing daily fiber intake to 25 gm/day, increase daily water intake to 2L/day.  Continue regular exercise. Avoid constipation given h/o pelvic organ prolapse.     I personally spent a total of 50 minutes in the care of the patient today including preparing to see the patient, performing a medically appropriate exam/evaluation, counseling and educating, placing orders, referring and communicating with other health care professionals, documenting clinical information in the EHR, independently interpreting results, and communicating results.  Return in about 6 months (around 06/28/2024) for HTN follow  up .   Charmion Hapke, MD

## 2023-12-27 NOTE — Assessment & Plan Note (Signed)
 H/O b 12 deficiency in the past with elevated serum B 12 level when she was taking B 12 supplement. Recommend repeat B 12 now that she is not taking supplement.

## 2023-12-27 NOTE — Assessment & Plan Note (Signed)
 Chronic.  Previously on Prolia, not currently on calcium  or bisphosphonate treatment. Repeat DEXA ordered. Management pending results.

## 2023-12-27 NOTE — Assessment & Plan Note (Signed)
 Check A1c.

## 2023-12-28 ENCOUNTER — Telehealth: Payer: Self-pay

## 2023-12-28 NOTE — Telephone Encounter (Signed)
 Return call to patient to inform her that her prescriptions were sent and received by her local pharmacy yesterday. Patient was advised to reach out to her pharmacy, as they may not be ready for pick up. Verbalized understanding.

## 2023-12-28 NOTE — Telephone Encounter (Signed)
 Copied from CRM #[id number]. Topic: Clinical - Medication Question >> Dec 28, 2023  9:00 AM Viola F wrote: Reason for CRM: Patient was seen yesterday 12/27/23 by Dr. Abbey - she would like Dr. Abbey to send over medication for cholesterol to pharmacy on file so she can start it

## 2023-12-31 ENCOUNTER — Ambulatory Visit (INDEPENDENT_AMBULATORY_CARE_PROVIDER_SITE_OTHER): Admitting: *Deleted

## 2023-12-31 VITALS — Ht 59.0 in | Wt 127.0 lb

## 2023-12-31 DIAGNOSIS — Z0000 Encounter for general adult medical examination without abnormal findings: Secondary | ICD-10-CM | POA: Diagnosis not present

## 2023-12-31 NOTE — Patient Instructions (Signed)
 Erica Valencia , Thank you for taking time out of your busy schedule to complete your Annual Wellness Visit with me. I enjoyed our conversation and look forward to speaking with you again next year. I, as well as your care team,  appreciate your ongoing commitment to your health goals. Please review the following plan we discussed and let me know if I can assist you in the future. Your Game plan/ To Do List    Referrals: If you haven't heard from the office you've been referred to, please reach out to them at the phone provided.  Consider updating your vaccines  Follow up Visits: We will see or speak with you next year for your Next Medicare AWV with our clinical staff 01/03/25 @ 9:30 Have you seen your provider in the last 6 months (3 months if uncontrolled diabetes)? Yes  Clinician Recommendations:  Aim for 30 minutes of exercise or brisk walking, 6-8 glasses of water, and 5 servings of fruits and vegetables each day.       This is a list of the screenings recommended for you:  Health Maintenance  Topic Date Due   Flu Shot  08/15/2024*   Medicare Annual Wellness Visit  12/30/2024   Mammogram  04/26/2025   DTaP/Tdap/Td vaccine (2 - Td or Tdap) 10/01/2030   Colon Cancer Screening  09/24/2031   DEXA scan (bone density measurement)  Completed   HPV Vaccine  Aged Out   Meningitis B Vaccine  Aged Out   Pneumococcal Vaccine for age over 68  Discontinued   COVID-19 Vaccine  Discontinued   Hepatitis C Screening  Discontinued   Zoster (Shingles) Vaccine  Discontinued  *Topic was postponed. The date shown is not the original due date.    Advanced directives: (Copy Requested) Please bring a copy of your health care power of attorney and living will to the office to be added to your chart at your convenience. You can mail to [facility] [address] [place], [place] [address] or email to [email or web address] Advance Care Planning is important because it:  [x]  Makes sure you  receive the medical care that is consistent with your values, goals, and preferences  [x]  It provides guidance to your family and loved ones and reduces their decisional burden about whether or not they are making the right decisions based on your wishes.

## 2023-12-31 NOTE — Progress Notes (Signed)
 Subjective:   Erica Valencia is a 73 y.o. who presents for a Medicare Wellness preventive visit.  As a reminder, Annual Wellness Visits don't include a physical exam, and some assessments may be limited, especially if this visit is performed virtually. We may recommend an in-person follow-up visit with your provider if needed.  Visit Complete: Virtual I connected with  [name] on 12/31/23 by a audio enabled telemedicine application and verified that I am speaking with the correct person using two identifiers.  Patient Location: Home  Provider Location: Home Office  I discussed the limitations of evaluation and management by telemedicine. The patient expressed understanding and agreed to proceed.  Vital Signs: Because this visit was a virtual/telehealth visit, some criteria may be missing or patient reported. Any vitals not documented were not able to be obtained and vitals that have been documented are patient reported.  VideoDeclined- This patient declined Librarian, academic. Therefore the visit was completed with audio only.  Persons Participating in Visit: Patient.  AWV Questionnaire: No: Patient Medicare AWV questionnaire was not completed prior to this visit.  Cardiac Risk Factors include: advanced age (>42men, >106 women);dyslipidemia;hypertension     Objective:    Today's Vitals   12/31/23 0934  Weight: 127 lb (57.6 kg)  Height: 4' 11 (1.499 m)   Body mass index is 25.65 kg/m.     12/31/2023    9:46 AM 10/21/2023   10:40 AM 09/23/2021    8:00 AM 09/30/2020    6:15 AM 12/04/2019    2:25 PM 09/12/2018   10:01 AM 11/08/2017   11:49 AM  Advanced Directives  Does Patient Have a Medical Advance Directive? Yes No No No Yes No No   Type of Estate agent of [place];Living will    Living will    Does patient want to make changes to medical advance directive?     No - Patient declined    Copy of Healthcare Power of Attorney  in Chart? No - copy requested        Would patient like information on creating a medical advance directive?  No - Patient declined No - Patient declined   No - Patient declined  No - Patient declined      Data saved with a previous flowsheet row definition    Current Medications (verified) Outpatient Encounter Medications as of 12/31/2023  Medication Sig   albuterol  (VENTOLIN  HFA) 108 (90 Base) MCG/ACT inhaler Inhale 1-2 puffs into the lungs every 6 (six) hours as needed.   amLODipine  (NORVASC ) 2.5 MG tablet Take 1 tablet (2.5 mg total) by mouth 2 (two) times daily.   aspirin  EC 81 MG tablet Take 1 tablet (81 mg total) by mouth daily. Swallow whole. (Patient taking differently: Take 81 mg by mouth every other day. Swallow whole.)   atorvastatin  (LIPITOR) 10 MG tablet Take 1 tablet (10 mg total) by mouth every other day.   metoprolol  succinate (TOPROL -XL) 25 MG 24 hr tablet Take 1 tablet (25 mg total) by mouth at bedtime.   No facility-administered encounter medications on file as of 12/31/2023.    Allergies (verified) Sulfa antibiotics, Statins, Telmisartan , Zetia  [ezetimibe ], Penicillins, and Rosuvastatin    History: Past Medical History:  Diagnosis Date   Allergy    Bronchitis    Chest pain of uncertain etiology 09/18/2018   Coronary artery calcification seen on CT scan    a. 05/2021 Cardiac CT: Ca2+ = 108 (69th %'ile).   COVID-19 09/30/2020  Diastolic dysfunction    a. 11/2015 Echo: EF 60-65%, no rwma, GrI DD, nl RV fxn, trive MR/TR, RVSP .   Hemorrhoids    Hyperlipidemia    Hypertension    Scarlet fever    a. as child caused vision issues has to wear glasses now.   Sleep apnea    Syncope    a. 09/2020 Zio: Predominantly sinus rhythm @ 64 (41-203). 21 SVT runs, fastest 203 x 15 beats, longests 19 beats @ 99. Triggered events not assoc w/ signif arrhythmia.   Past Surgical History:  Procedure Laterality Date   APPENDECTOMY     age [age]   CARPAL TUNNEL RELEASE      left 2008   COLONOSCOPY WITH PROPOFOL  N/A 09/23/2021   Procedure: COLONOSCOPY WITH PROPOFOL ;  Surgeon: Maryruth Ole DASEN, MD;  Location: [facility] ENDOSCOPY;  Service: Endoscopy;  Laterality: N/A;   OTHER SURGICAL HISTORY     left great toe fusion 2012    Family History  Problem Relation Age of Onset   Other Mother        brain tumor died 19; heavy smoker    Heart attack Father        35s   Breast cancer Neg Hx    Social History   Socioeconomic History   Marital status: Married    Spouse name: Not on file   Number of children: Not on file   Years of education: Not on file   Highest education level: Not on file  Occupational History   Not on file  Tobacco Use   Smoking status: Never   Smokeless tobacco: Never  Vaping Use   Vaping status: Never Used  Substance and Sexual Activity   Alcohol use: No   Drug use: No   Sexual activity: Not Currently    Partners: Male  Other Topics Concern   Not on file  Social History Narrative   Moved from [place] 2018   Mother was [place] she was born in [place] father American soldier never knew    Married    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    Social Drivers of Corporate investment banker Strain: Low Risk  (12/31/2023)   Overall Financial Resource Strain (CARDIA)    Difficulty of Paying Living Expenses: Not hard at all  Food Insecurity: No Food Insecurity (12/31/2023)   Hunger Vital Sign    Worried About Running Out of Food in the Last Year: Never true    Ran Out of Food in the Last Year: Never true  Transportation Needs: No Transportation Needs (12/31/2023)   PRAPARE - Administrator, Civil Service (Medical): No    Lack of Transportation (Non-Medical): No  Physical Activity: Sufficiently Active (12/31/2023)   Exercise Vital Sign    Days of Exercise per Week: 6 days    Minutes of Exercise per Session: 60 min  Stress: No Stress Concern Present (12/31/2023)   [employer] of  Occupational Health - Occupational Stress Questionnaire    Feeling of Stress: Not at all  Social Connections: Socially Integrated (12/31/2023)   Social Connection and Isolation Panel    Frequency of Communication with Friends and Family: More than three times a week    Frequency of Social Gatherings with Friends and Family: More than three times a week    Attends Religious Services: More than 4 times per year    Active Member of [employer] or Organizations:  Yes    Attends Banker Meetings: More than 4 times per year    Marital Status: Married    Tobacco Counseling Counseling given: Not Answered    Clinical Intake:  Pre-visit preparation completed: Yes  Pain : No/denies pain     BMI - recorded: 25.65 Nutritional Status: BMI 25 -29 Overweight Nutritional Risks: None Diabetes: No  Lab Results  Component Value Date   HGBA1C 5.9 12/27/2023   HGBA1C 5.8 12/31/2022   HGBA1C 5.7 12/02/2021     How often do you need to have someone help you when you read instructions, pamphlets, or other written materials from your doctor or pharmacy?: 1 - Never  Interpreter Needed?: No  Information entered by :: R. Daneya Hartgrove LPN   Activities of Daily Living     12/31/2023    9:35 AM  In your present state of health, do you have any difficulty performing the following activities:  Hearing? 0  Vision? 0  Difficulty concentrating or making decisions? 0  Walking or climbing stairs? 0  Dressing or bathing? 0  Doing errands, shopping? 0  Preparing Food and eating ? N  Using the Toilet? N  In the past six months, have you accidently leaked urine? N  Do you have problems with loss of bowel control? N  Managing your Medications? N  Managing your Finances? N  Housekeeping or managing your Housekeeping? N    Patient Care Team: Bair, Kalpana, MD as PCP - General (Family Medicine) Gollan, Timothy J, MD as PCP - Cardiology (Cardiology)  I have updated your Care Teams any recent Medical  Services you may have received from other providers in the past year.     Assessment:   This is a routine wellness examination for [place].  Hearing/Vision screen Hearing Screening - Comments:: No issues Vision Screening - Comments:: glasses   Goals Addressed             This Visit's Progress    Patient Stated       Continue to stay active       Depression Screen     12/31/2023    9:40 AM 12/27/2023    1:03 PM 01/04/2023    1:32 PM 09/02/2022    3:31 PM 08/04/2022    3:18 PM 12/02/2021    8:25 AM 05/22/2021    1:27 PM  PHQ 2/9 Scores  PHQ - 2 Score 0 0 0 0 0 0 0  PHQ- 9 Score 0 0 0 0       Fall Risk     12/31/2023    9:37 AM 12/27/2023    1:03 PM 01/04/2023    1:31 PM 09/02/2022    3:30 PM 08/04/2022    3:18 PM  Fall Risk   Falls in the past year? 0 0 1 0 0  Number falls in past yr: 0 0 0 0 0  Injury with Fall? 0 0 1 0 0  Risk for fall due to : No Fall Risks No Fall Risks No Fall Risks No Fall Risks No Fall Risks  Follow up Falls evaluation completed;Falls prevention discussed Falls evaluation completed;Education provided Falls evaluation completed Falls evaluation completed Falls evaluation completed    MEDICARE RISK AT HOME:  Medicare Risk at Home Any stairs in or around the home?: Yes If so, are there any without handrails?: No Home free of loose throw rugs in walkways, pet beds, electrical cords, etc?: Yes Adequate lighting in your home to reduce risk  of falls?: Yes Life alert?: No Use of a cane, walker or w/c?: No Grab bars in the bathroom?: Yes Shower chair or bench in shower?: No Elevated toilet seat or a handicapped toilet?: No  TIMED UP AND GO:  Was the test performed?  No  Cognitive Function: Declined/Normal: No cognitive concerns noted by patient or family. Patient alert, oriented, able to answer questions appropriately and recall recent events. No signs of memory loss or confusion.    11/08/2017   11:50 AM  MMSE - Mini Mental State Exam   Orientation to time 5  Orientation to Place 5  Registration 3  Attention/ Calculation 5  Recall 3  Language- name 2 objects 2  Language- repeat 1  Language- follow 3 step command 3  Language- read & follow direction 1  Write a sentence 1  Copy design 1  Total score 30        12/31/2023    9:46 AM 12/04/2019    2:26 PM  6CIT Screen  What Year? 0 points 0 points  What month? 0 points 0 points  What time?  0 points  Patient declined to finish answering the questions. Patient stated that her memory is fine and not interested in answering anymore of these questions.  Patient requested to move on to the rest of the visit.  Immunizations Immunization History  Administered Date(s) Administered   Fluad Quad(high Dose 65+) 01/25/2019, 02/26/2022   Influenza, High Dose Seasonal PF 05/22/2017, 03/16/2018, 03/20/2021   Influenza-Unspecified 05/25/2017, 03/16/2018   Moderna Sars-Covid-2 Vaccination 01/03/2020, 01/31/2020   Tdap 09/30/2020    Screening Tests Health Maintenance  Topic Date Due   Medicare Annual Wellness (AWV)  07/02/2023   INFLUENZA VACCINE  08/15/2024 (Originally 12/17/2023)   MAMMOGRAM  04/26/2025   DTaP/Tdap/Td (2 - Td or Tdap) 10/01/2030   Colonoscopy  09/24/2031   DEXA SCAN  Completed   HPV VACCINES  Aged Out   Meningococcal B Vaccine  Aged Out   Pneumococcal Vaccine: 50+ Years  Discontinued   COVID-19 Vaccine  Discontinued   Hepatitis C Screening  Discontinued   Zoster Vaccines- Shingrix  Discontinued    Health Maintenance  Health Maintenance Due  Topic Date Due   Medicare Annual Wellness (AWV)  07/02/2023   Health Maintenance Items Addressed: Patient declines vaccines at this time  Additional Screening:  Vision Screening: Recommended annual ophthalmology exams for early detection of glaucoma and other disorders of the eye. Up to date [facility] Would you like a referral to an eye doctor? No    Dental Screening: Recommended annual dental exams  for proper oral hygiene  Community Resource Referral / Chronic Care Management: CRR required this visit?  No   CCM required this visit?  No   Plan:    I have personally reviewed and noted the following in the patient's chart:   Medical and social history Use of alcohol, tobacco or illicit drugs  Current medications and supplements including opioid prescriptions. Patient is not currently taking opioid prescriptions. Functional ability and status Nutritional status Physical activity Advanced directives List of other physicians Hospitalizations, surgeries, and ER visits in previous 12 months Vitals Screenings to include cognitive, depression, and falls Referrals and appointments  In addition, I have reviewed and discussed with patient certain preventive protocols, quality metrics, and best practice recommendations. A written personalized care plan for preventive services as well as general preventive health recommendations were provided to patient.   Angeline Fredericks, LPN   1/84/7974   After  Visit Summary: (MyChart) Due to this being a telephonic visit, the after visit summary with patients personalized plan was offered to patient via MyChart   Notes: Nothing significant to report at this time.

## 2024-01-20 ENCOUNTER — Encounter: Payer: Self-pay | Admitting: Emergency Medicine

## 2024-01-20 ENCOUNTER — Ambulatory Visit
Admission: EM | Admit: 2024-01-20 | Discharge: 2024-01-20 | Disposition: A | Discharge status: Home / Self Care | Attending: Emergency Medicine | Admitting: Emergency Medicine

## 2024-01-20 DIAGNOSIS — J069 Acute upper respiratory infection, unspecified: Secondary | ICD-10-CM | POA: Diagnosis not present

## 2024-01-20 MED ORDER — GUAIFENESIN-CODEINE 100-10 MG/5ML PO SOLN: 5.0000 mL | Freq: Every evening | ORAL | 0 refills | Status: DC | PRN

## 2024-01-20 MED ORDER — AZITHROMYCIN 250 MG PO TABS: 250.0000 mg | ORAL_TABLET | Freq: Every day | ORAL | 0 refills | Status: DC

## 2024-01-20 NOTE — Progress Notes (Unsigned)
    GYNECOLOGY PROGRESS NOTE  Subjective:  PCP: Bair, Kalpana, MD  Patient ID: Erica Valencia, female    DOB: [birth year], 73 y.o.   MRN: [id number]  HPI  Patient is a 73 y.o. G38P2002 female who presents to follow up on pelvic organ prolapse.  She was seen here in April and no prolapse was noted on exam.  She says she feels like the pressure is worsening, and when she has a bowel movement she has to hold her uterus up to pass the stool or she feels like it will all come falling out.  She denies any urinary incontinence.  {Common ambulatory SmartLinks:19316}  Review of Systems {ros; complete:30496}   Objective:   Blood pressure (!) 148/67, pulse 69, weight 129 lb 6.4 oz (58.7 kg). Body mass index is 26.14 kg/m.  General appearance: {general exam:16600} Abdomen: {abdominal exam:16834} Pelvic: {pelvic exam:16852::cervix normal in appearance,external genitalia normal,no adnexal masses or tenderness,no cervical motion tenderness,rectovaginal septum normal,uterus normal size, shape, and consistency,vagina normal without discharge} Extremities: {extremity exam:5109} Neurologic: {neuro exam:17854}   Assessment/Plan:   1. Vaginal prolapse     Fitted  Size #3 ring with support BP high, see PCP  There are no diagnoses linked to this encounter.     Estil Mangle, DO  [facility]

## 2024-01-20 NOTE — Discharge Instructions (Addendum)
 Begin azithromycin  to provide coverage for bacteria that can cause symptoms to linger  May use cough syrup at bedtime to allow for rest  You can take Tylenol and/or Ibuprofen as needed for fever reduction and pain relief.   For cough: honey 1/2 to 1 teaspoon (you can dilute the honey in water or another fluid).  You can also use guaifenesin  and dextromethorphan for cough. You can use a humidifier for chest congestion and cough.  If you don't have a humidifier, you can sit in the bathroom with the hot shower running.      For sore throat: try warm salt water gargles, cepacol lozenges, throat spray, warm tea or water with lemon/honey, popsicles or ice, or OTC cold relief medicine for throat discomfort.   For congestion: take a daily anti-histamine like Zyrtec, Claritin, and a oral decongestant, such as pseudoephedrine.  You can also use Flonase 1-2 sprays in each nostril daily.   It is important to stay hydrated: drink plenty of fluids (water, gatorade/powerade/pedialyte, juices, or teas) to keep your throat moisturized and help further relieve irritation/discomfort.

## 2024-01-20 NOTE — ED Triage Notes (Signed)
 Patient reports cough and chest congestion x 1 week. Reports she caught it from her husband who has been sick for 2 weeks. Patient also states husband caught from grand kids who had a viral illness. Patient states the illness was not COVID or flu. Denies pain.

## 2024-01-20 NOTE — ED Provider Notes (Signed)
 [facility]    CSN: [id number] Arrival date & time: 01/20/24  9080      History   Chief Complaint Chief Complaint  Patient presents with   Cough    HPI Erica Valencia is a 73 y.o. female.   Patient presents for evaluation of fever, nonproductive congested cough, nasal congestion with clear mucus, sinus pressure, ear pain and sore throat beginning 7 days ago.  Fever sore throat ear pain and sinus pressure have resolved.  Cough interferes with sleep, worsening at nighttime.  Has attempted use of Tylenol, Robitussin DM and guaifenesin  codeine  with minimal relief.  Tolerable to food and liquids.  Known sick contact in household and by grandchildren prior to symptoms beginning.  Denies shortness of breath or wheezing.    Past Medical History:  Diagnosis Date   Allergy    Bronchitis    Chest pain of uncertain etiology 09/18/2018   Coronary artery calcification seen on CT scan    a. 05/2021 Cardiac CT: Ca2+ = 108 (69th %'ile).   COVID-19 09/30/2020   Diastolic dysfunction    a. 11/2015 Echo: EF 60-65%, no rwma, GrI DD, nl RV fxn, trive MR/TR, RVSP .   Hemorrhoids    Hyperlipidemia    Hypertension    Scarlet fever    a. as child caused vision issues has to wear glasses now.   Sleep apnea    Syncope    a. 09/2020 Zio: Predominantly sinus rhythm @ 64 (41-203). 21 SVT runs, fastest 203 x 15 beats, longests 19 beats @ 99. Triggered events not assoc w/ signif arrhythmia.    Patient Active Problem List   Diagnosis Date Noted   Vaginal atrophy 09/13/2023   Pelvic pressure in female 09/13/2023   Female genital prolapse 06/29/2023   Vitamin B 12 deficiency 06/29/2023   Vitamin D  deficiency 06/29/2023   Hemorrhoids 12/02/2021   Statin intolerance    Prediabetes 11/15/2020   Annual physical exam 04/17/2020   Osteoporosis 01/25/2019   OSA (obstructive sleep apnea) 12/09/2018   Hyperlipidemia 12/16/2017   Essential hypertension 05/27/2017    Past Surgical History:   Procedure Laterality Date   APPENDECTOMY     age [age]   CARPAL TUNNEL RELEASE     left 2008   COLONOSCOPY WITH PROPOFOL  N/A 09/23/2021   Procedure: COLONOSCOPY WITH PROPOFOL ;  Surgeon: Maryruth Ole DASEN, MD;  Location: [facility] ENDOSCOPY;  Service: Endoscopy;  Laterality: N/A;   OTHER SURGICAL HISTORY     left great toe fusion 2012     OB History     Gravida  2   Para  2   Term  2   Preterm      AB      Living  2      SAB      IAB      Ectopic      Multiple      Live Births               Home Medications    Prior to Admission medications   Medication Sig Start Date End Date Taking? Authorizing Provider  azithromycin  (ZITHROMAX ) 250 MG tablet Take 1 tablet (250 mg total) by mouth daily. Take first 2 tablets together, then 1 every day until finished. 01/20/24  Yes Thomasena Vandenheuvel R, NP  guaiFENesin -codeine  100-10 MG/5ML syrup Take 5 mLs by mouth at bedtime as needed for cough. 01/20/24  Yes Undray Allman, Shelba SAUNDERS, NP  albuterol  (VENTOLIN  HFA) 108 (90 Base) MCG/ACT inhaler  Inhale 1-2 puffs into the lungs every 6 (six) hours as needed. 07/05/23   Vivienne Delon HERO, PA-C  amLODipine  (NORVASC ) 2.5 MG tablet Take 1 tablet (2.5 mg total) by mouth 2 (two) times daily. 12/27/23   Bair, Kalpana, MD  aspirin  EC 81 MG tablet Take 1 tablet (81 mg total) by mouth daily. Swallow whole. Patient taking differently: Take 81 mg by mouth every other day. Swallow whole. 03/24/23   Vivienne Lonni Ingle, NP  atorvastatin  (LIPITOR) 10 MG tablet Take 1 tablet (10 mg total) by mouth every other day. 12/27/23   Bair, Kalpana, MD  metoprolol  succinate (TOPROL -XL) 25 MG 24 hr tablet Take 1 tablet (25 mg total) by mouth at bedtime. 12/27/23   Abbey Bruckner, MD    Family History Family History  Problem Relation Age of Onset   Other Mother        brain tumor died 71; heavy smoker    Heart attack Father        66s   Breast cancer Neg Hx     Social History Social History   Tobacco Use    Smoking status: Never   Smokeless tobacco: Never  Vaping Use   Vaping status: Never Used  Substance Use Topics   Alcohol use: No   Drug use: No     Allergies   Sulfa antibiotics, Statins, Telmisartan , Zetia  [ezetimibe ], Penicillins, and Rosuvastatin    Review of Systems Review of Systems   Physical Exam Triage Vital Signs ED Triage Vitals  Encounter Vitals Group     BP 01/20/24 0928 129/70     Girls Systolic BP Percentile --      Girls Diastolic BP Percentile --      Boys Systolic BP Percentile --      Boys Diastolic BP Percentile --      Pulse Rate 01/20/24 0928 74     Resp 01/20/24 0928 18     Temp 01/20/24 0928 98.2 F (36.8 C)     Temp Source 01/20/24 0928 Oral     SpO2 01/20/24 0928 97 %     Weight --      Height --      Head Circumference --      Peak Flow --      Pain Score 01/20/24 0929 0     Pain Loc --      Pain Education --      Exclude from Growth Chart --    No data found.  Updated Vital Signs BP 129/70 (BP Location: Left Arm)   Pulse 74   Temp 98.2 F (36.8 C) (Oral)   Resp 18   SpO2 97%   Visual Acuity Right Eye Distance:   Left Eye Distance:   Bilateral Distance:    Right Eye Near:   Left Eye Near:    Bilateral Near:     Physical Exam Constitutional:      Appearance: Normal appearance.  HENT:     Head: Normocephalic.     Right Ear: Tympanic membrane, ear canal and external ear normal.     Left Ear: Tympanic membrane, ear canal and external ear normal.     Nose: Congestion present.     Mouth/Throat:     Mouth: Mucous membranes are moist.     Pharynx: Oropharynx is clear. No oropharyngeal exudate or posterior oropharyngeal erythema.  Eyes:     Extraocular Movements: Extraocular movements intact.  Cardiovascular:     Rate and Rhythm: Normal rate and regular rhythm.  Pulses: Normal pulses.     Heart sounds: Normal heart sounds.  Pulmonary:     Effort: Pulmonary effort is normal.     Breath sounds: Normal breath sounds.      Comments: Congested barking cough witnessed Musculoskeletal:     Cervical back: Normal range of motion and neck supple.  Neurological:     Mental Status: She is alert and oriented to person, place, and time. Mental status is at baseline.      UC Treatments / Results  Labs (all labs ordered are listed, but only abnormal results are displayed) Labs Reviewed - No data to display  EKG   Radiology No results found.  Procedures Procedures (including critical care time)  Medications Ordered in UC Medications - No data to display  Initial Impression / Assessment and Plan / UC Course  I have reviewed the triage vital signs and the nursing notes.  Pertinent labs & imaging results that were available during my care of the patient were reviewed by me and considered in my medical decision making (see chart for details).  Acute URI  Patient is in no signs of distress nor toxic appearing.  Vital signs are stable.  Low suspicion for pneumonia, pneumothorax or bronchitis and therefore will defer imaging.  Viral testing deferred due to timeline of illness.  Empirically placing on azithromycin , offered steroids, declined at this time, endorses albuterol  inhaler availability at home, refilled guaifenesin  codeine , PDMP reviewed, low risk.May use additional over-the-counter medications as needed for supportive care.  May follow-up with urgent care as needed if symptoms persist or worsen.   Final Clinical Impressions(s) / UC Diagnoses   Final diagnoses:  Acute URI     Discharge Instructions      Begin azithromycin  to provide coverage for bacteria that can cause symptoms to linger  May use cough syrup at bedtime to allow for rest  You can take Tylenol and/or Ibuprofen as needed for fever reduction and pain relief.   For cough: honey 1/2 to 1 teaspoon (you can dilute the honey in water or another fluid).  You can also use guaifenesin  and dextromethorphan for cough. You can use a humidifier  for chest congestion and cough.  If you don't have a humidifier, you can sit in the bathroom with the hot shower running.      For sore throat: try warm salt water gargles, cepacol lozenges, throat spray, warm tea or water with lemon/honey, popsicles or ice, or OTC cold relief medicine for throat discomfort.   For congestion: take a daily anti-histamine like Zyrtec, Claritin, and a oral decongestant, such as pseudoephedrine.  You can also use Flonase 1-2 sprays in each nostril daily.   It is important to stay hydrated: drink plenty of fluids (water, gatorade/powerade/pedialyte, juices, or teas) to keep your throat moisturized and help further relieve irritation/discomfort.    ED Prescriptions     Medication Sig Dispense Auth. Provider   azithromycin  (ZITHROMAX ) 250 MG tablet Take 1 tablet (250 mg total) by mouth daily. Take first 2 tablets together, then 1 every day until finished. 6 tablet Calin Ellery R, NP   guaiFENesin -codeine  100-10 MG/5ML syrup Take 5 mLs by mouth at bedtime as needed for cough. 120 mL Teresa Shelba SAUNDERS, NP      PDMP not reviewed this encounter.   Teresa Shelba SAUNDERS, [place] 01/20/24 [id number]

## 2024-01-25 ENCOUNTER — Encounter: Payer: Self-pay | Admitting: Obstetrics

## 2024-01-25 ENCOUNTER — Ambulatory Visit: Admitting: Obstetrics

## 2024-01-25 VITALS — BP 148/67 | HR 69 | Wt 129.4 lb

## 2024-01-25 DIAGNOSIS — N812 Incomplete uterovaginal prolapse: Secondary | ICD-10-CM

## 2024-01-25 DIAGNOSIS — Z4689 Encounter for fitting and adjustment of other specified devices: Secondary | ICD-10-CM

## 2024-01-25 DIAGNOSIS — N8110 Cystocele, unspecified: Secondary | ICD-10-CM

## 2024-01-25 DIAGNOSIS — R030 Elevated blood-pressure reading, without diagnosis of hypertension: Secondary | ICD-10-CM | POA: Diagnosis not present

## 2024-01-26 DIAGNOSIS — N812 Incomplete uterovaginal prolapse: Secondary | ICD-10-CM | POA: Insufficient documentation

## 2024-02-02 ENCOUNTER — Ambulatory Visit: Admitting: Physical Therapy

## 2024-02-09 ENCOUNTER — Encounter: Admitting: Physical Therapy

## 2024-02-10 ENCOUNTER — Encounter: Payer: Self-pay | Admitting: Physical Therapy

## 2024-02-10 ENCOUNTER — Ambulatory Visit: Attending: Obstetrics | Admitting: Physical Therapy

## 2024-02-10 DIAGNOSIS — R2689 Other abnormalities of gait and mobility: Secondary | ICD-10-CM | POA: Insufficient documentation

## 2024-02-10 DIAGNOSIS — R102 Pelvic and perineal pain: Secondary | ICD-10-CM | POA: Insufficient documentation

## 2024-02-10 DIAGNOSIS — M533 Sacrococcygeal disorders, not elsewhere classified: Secondary | ICD-10-CM | POA: Diagnosis not present

## 2024-02-10 DIAGNOSIS — R293 Abnormal posture: Secondary | ICD-10-CM | POA: Insufficient documentation

## 2024-02-10 DIAGNOSIS — N952 Postmenopausal atrophic vaginitis: Secondary | ICD-10-CM | POA: Diagnosis not present

## 2024-02-10 NOTE — Therapy (Signed)
 OUTPATIENT PHYSICAL THERAPY EVALUATION   Patient Name: Erica Valencia MRN: [id number] DOB:10/24/1950, 73 y.o., female Today's Date: 02/10/2024   PT End of Session - 02/10/24 0950     Visit Number 1    Number of Visits 10    Date for Recertification  04/20/24    PT Start Time 0945    PT Stop Time 1015    PT Time Calculation (min) 30 min    Activity Tolerance Patient tolerated treatment well;No increased pain    Behavior During Therapy [facility] for tasks assessed/performed          Past Medical History:  Diagnosis Date   Allergy    Bronchitis    Chest pain of uncertain etiology 09/18/2018   Coronary artery calcification seen on CT scan    a. 05/2021 Cardiac CT: Ca2+ = 108 (69th %'ile).   COVID-19 09/30/2020   Diastolic dysfunction    a. 11/2015 Echo: EF 60-65%, no rwma, GrI DD, nl RV fxn, trive MR/TR, RVSP .   Hemorrhoids    Hyperlipidemia    Hypertension    Scarlet fever    a. as child caused vision issues has to wear glasses now.   Sleep apnea    Syncope    a. 09/2020 Zio: Predominantly sinus rhythm @ 64 (41-203). 21 SVT runs, fastest 203 x 15 beats, longests 19 beats @ 99. Triggered events not assoc w/ signif arrhythmia.   Past Surgical History:  Procedure Laterality Date   APPENDECTOMY     age [age]   CARPAL TUNNEL RELEASE     left 2008   COLONOSCOPY WITH PROPOFOL  N/A 09/23/2021   Procedure: COLONOSCOPY WITH PROPOFOL ;  Surgeon: Maryruth Ole DASEN, MD;  Location: [facility] ENDOSCOPY;  Service: Endoscopy;  Laterality: N/A;   OTHER SURGICAL HISTORY     left great toe fusion 2012    Patient Active Problem List   Diagnosis Date Noted   Cystocele with first degree uterine prolapse 01/26/2024   Vaginal atrophy 09/13/2023   Pelvic pressure in female 09/13/2023   Female genital prolapse 06/29/2023   Vitamin B 12 deficiency 06/29/2023   Vitamin D  deficiency 06/29/2023   Hemorrhoids 12/02/2021   Statin intolerance    Prediabetes 11/15/2020   Annual physical exam  04/17/2020   Osteoporosis 01/25/2019   OSA (obstructive sleep apnea) 12/09/2018   Hyperlipidemia 12/16/2017   Essential hypertension 05/27/2017    PCP: Abbey   REFERRING PROVIDER: Leigh   REFERRING DIAG:  N95.2 (ICD-10-CM) - Vaginal atrophy R10.2 (ICD-10-CM) - Pelvic pressure in female  Rationale for Evaluation and Treatment Rehabilitation  THERAPY DIAG:  Sacrococcygeal disorders, not elsewhere classified  Abnormal posture  Other abnormalities of gait and mobility  ONSET DATE:   SUBJECTIVE:  SUBJECTIVE STATEMENT ON EVAL 02/10/24 : Prolapse : Pt noticed vaginal prolapse showing itself at the vaginal opening four months ago . Pt was moving her couch and felt the bulging after. Pt has been fitted for pessary and will be returning the 9/30 /25 to make sure it is in proper place. Pt reported she has since returned to exercises and walking with the help of the pessary.  Excercises:  walking 2 miles daily 5 days per week with stretching routine.   Routine she discovered online : seated twists, deep core squat,  No crunches ,  no sit ups   Daily bowel movements , straining 30% of the time,   PERTINENT HISTORY:    PAIN:  Are you having pain? No  PRECAUTIONS: None  WEIGHT BEARING RESTRICTIONS: No   FALLS:  Has patient fallen in last 6 months? 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   PLOF: IND   PATIENT GOALS:   Strengthen herself     OBJECTIVE:   [facility] PT Assessment - 02/10/24 0951       Squat   Comments narrow BOS      Palpation   SI assessment  L shoulder and L iliac crest lowered  ,  sideflexion L 43cm, R 47 cm digit III to floor      Ambulation/Gait   Gait Comments 1.34 m/s, decreased  stance on R,             Self-Care   Self-Care Other Self-Care Comments    Other Self-Care Comments  explained regional interdependent approach to address pain at different body parts, explained upcoming visits to realign posture and spine.pelvis to improve Sx and achieve goals, showed anatomy images of deep core system ,      Therapeutic Activites    Therapeutic Activities Other Therapeutic Activities    Other Therapeutic Activities inquired about meaningful tasks and role of learning proper body mechanics, future sessions to help realign pelvis and spine and to learn co-activation of deep core system when performing against gravity tasks . These improvements will help address pain and Sx     Neuro Re-ed    Neuro Re-ed Details  cued for alignment and proprioception of pelvis and LKC and spine in  sit to stand and for proper sitting posture to activate deep core system         HOME EXERCISE PROGRAM: See pt instruction section    ASSESSMENT:  CLINICAL IMPRESSION: Pt is a  73  yo  who presents with prolapse and straining with bowel movements  following issues which impact QOL, ADL,  fitness, social and community activities:     Pt's musculoskeletal assessment revealed uneven pelvic girdle and shoulder height, asymmetries to gait pattern, limited spinal /pelvic mobility, dyscoordination and strength of pelvic floor mm, hip weakness, poor body mechanics which places strain on the abdominal/pelvic floor mm. These are deficits that indicate an ineffective intraabdominal pressure system associated with increased risk for pt's Sx.     Pt will benefit from propioception/ coordination/ body mechanics training and education with gravity-loaded tasks at work and home and  fitness modifications in order to gain a more effective intraabdominal pressure system to minimize Sx. Advised pt to not perform sit-ups and crunches as these movement patterns lead to more downward forces on the pelvic  floor, negatively impacting abdominopelvic/spinal dysfunctions.   Pt was provided education  on etiology of Sx with anatomy, physiology explanation with images along with the benefits of customized pelvic PT Tx based on pt's medical conditions and musculoskeletal deficits.  Explained the physiology of deep core mm coordination and roles of pelvic floor function in urination, defecation, sexual function, and postural control with deep core mm system, and the role of posture and alignment to help pelvic issues.    Regional interdependent approaches will yield greater benefits in pt's POC due to the complexity of pt's medical Hx and the significant impact their Sx have had on their QOL. Pt would benefit from a biopsychosocial approach to yield optimal outcomes. Plan to build interdisciplinary team with pt's providers to optimize patient-centered care as needed.   Following Tx today which pt tolerated without complaints,  pt demo'd proper body mechanics to minimize straining pelvic floor.  Plan to address realignment of spine/ pelvis at next session to help promote optimize IAP system for improved pelvic floor function, trunk stability, gait, balance, stabilization with mobility tasks.  Plan to address pelvic floor issues once pelvis and spine are realigned to yield better outcomes.                                                       Pt benefits from skilled PT.    OBJECTIVE IMPAIRMENTS decreased activity tolerance, decreased coordination, decreased endurance, decreased mobility, difficulty walking, decreased ROM, decreased strength, decreased safety awareness, hypomobility, increased muscle spasms, impaired flexibility, improper body mechanics, postural dysfunction, and  scar restrictions   ACTIVITY LIMITATIONS  self-care,  sleep, home chores, ADLs  tasks    PARTICIPATION LIMITATIONS:  community, social activities    PERSONAL FACTORS   affecting patient's functional outcome:    REHAB POTENTIAL:  Good   CLINICAL DECISION MAKING: Evolving/moderate complexity   EVALUATION COMPLEXITY: Moderate    PATIENT EDUCATION:    Education details: Showed pt anatomy images. Explained muscles attachments/ connection, physiology of deep core system/ spinal- thoracic-pelvis-lower kinetic chain as they relate to pt's presentation, Sx, and past Hx. Explained what and how these areas of deficits need to be restored to balance and function    See Therapeutic activity / neuromuscular re-education section  Answered pt's questions.   Person educated: Patient Education method: Explanation, Demonstration, Tactile cues, Verbal cues, and Handouts Education comprehension: verbalized understanding, returned demonstration, verbal cues required, tactile cues required, and needs further education     PLAN: PT FREQUENCY: 1x/week   PT DURATION: 10 weeks   PLANNED INTERVENTIONS:   Gait training;Stair training;Functional mobility training;DME Instruction;Therapeutic activities;Therapeutic exercise;Balance training;Neuromuscular re-education;Patient/family education;Vestibular;Visual/perceptual remediation/compensation;Passive range of motion;Moist Heat;Cryotherapy;Traction;Canalith Repostioning;Joint Manipulations;Manual lymph drainage;Manual techniques;Scar mobilization;Energy conservation;Dry needling;ADLs/Self Care Home Management;Biofeedback;Electrical Stimulation;Taping    PLAN FOR NEXT SESSION: See clinical impression for plan     GOALS: Goals reviewed with patient? Yes  SHORT TERM GOALS: Target date: 03/09/2024    Pt will demo IND with HEP                    Baseline: Not IND            Goal status: INITIAL   LONG TERM GOALS: Target date:  04/20/2024     1.Pt will demo proper deep core coordination without chest breathing and optimal excursion of diaphragm/pelvic floor in order to promote spinal stability and pelvic floor function  Baseline: dyscoordination Goal status: INITIAL  2.  Pt  will demo proper body mechanics in against gravity tasks and ADLs  work tasks, fitness  to minimize straining pelvic floor / back    Baseline: not IND, improper form that places strain on pelvic floor  Goal status: INITIAL    3. Pt will demo increased gait speed > 1.5 m/s with reciprocal gait pattern, longer stride length  in order to ambulate safely in community and return to fitness routine  Baseline: 1.34 m/s , decreased R stance  Goal status: INITIAL    4. Pt will demo levelled pelvic girdle and shoulder height and btieral sideflexion  in order to progress to deep core strengthening HEP and restore mobility at spine, pelvis, gait, posture minimize falls, and improve balance  Baseline: L shoulder and L iliac crest lowered  ,  sideflexion L 43cm, R 47 cm digit III to floor  Goal status: INITIAL   5. Pt will improve PFDI-7 questionnaire to  pts  score change  to demo improved QOL  Baseline:    ( greater pts indicate greater negative impact on QOL)   38  pts  ( total) 10  pts  ( UIQ-7 ) 14   pts  ( CRAIQ-7 ) 14   pts  ( POPIQ-7 )  Baseline:  Goal status: INITIAL  6. Ot will report no more straining with bowel movements and Type 4 across 100% of time  Baseline: Daily bowel movements , straining 30% of the time, Stool Type 4 across 75% of the time  GOAL: INITIAL    7. Pt will be IND with using pessary and report comfort with physical activities across 2 months  Baseline: just started wearing Sept 2025  GOAL: Initial     Pia Lupe Plump, PT 02/10/2024, 10:10 AM

## 2024-02-10 NOTE — Patient Instructions (Signed)
  Proper body mechanics with getting out of a chair to decrease strain  on back &pelvic floor   Avoid holding your breath when Getting out of the chair:  Scoot to front part of chair chair Heels behind knees, feet are hip width apart, nose over toes  Inhale like you are smelling roses Exhale to stand   __  Replace the exercise you found on line with deep squat:  Instead do these   Minisquat: Scoot buttocks back slight, hinge like you are looking at your reflection on a pond  Knees behind toes, feet are hip width apart  Inhale to smell flowers  Exhale on the rise like rocket  Do not lock knees, have more weight across ballmounds of feet, toes relaxed and spread them, not grip them   10 reps x 3 x day

## 2024-02-15 ENCOUNTER — Ambulatory Visit: Admitting: Physical Therapy

## 2024-02-16 ENCOUNTER — Encounter: Admitting: Physical Therapy

## 2024-02-17 ENCOUNTER — Ambulatory Visit: Attending: Obstetrics | Admitting: Physical Therapy

## 2024-02-17 DIAGNOSIS — R2689 Other abnormalities of gait and mobility: Secondary | ICD-10-CM | POA: Insufficient documentation

## 2024-02-17 DIAGNOSIS — M533 Sacrococcygeal disorders, not elsewhere classified: Secondary | ICD-10-CM | POA: Diagnosis not present

## 2024-02-17 DIAGNOSIS — R293 Abnormal posture: Secondary | ICD-10-CM | POA: Insufficient documentation

## 2024-02-17 NOTE — Progress Notes (Signed)
  HPI:      Ms. Erica Valencia is a 73 y.o. [phone] who presents today for her pessary follow up and examination related to her cystocele and 1st deg uterine prolapse.  Pt reports tolerating the pessary well with no vaginal bleeding and  no vaginal discharge.  Symptoms of pelvic floor weakening have greatly improved. She is voiding and defecating without difficulty, actually feels like she is urinating better. She currently has a #3 ring with support pessary and does not want to pursue home maintenance. She also stopped using the vaginal Estrace  due to it making her feel anxious, this happened in her 7s with menopause and she forgot until she used it again. Would prefer to stay off.   BP elevated today, has not taken her medications. Denies HA, vision changes, palpitations, dizziness/lightheadedness, or weakness.   PMHx: She  has a past medical history of Allergy, Bronchitis, Chest pain of uncertain etiology (09/18/2018), Coronary artery calcification seen on CT scan, COVID-19 (09/30/2020), Diastolic dysfunction, Hemorrhoids, Hyperlipidemia, Hypertension, Scarlet fever, Sleep apnea, and Syncope. Also,  has a past surgical history that includes Appendectomy; Carpal tunnel release; OTHER SURGICAL HISTORY; and Colonoscopy with propofol  (N/A, 09/23/2021)., family history includes Heart attack in her father; Other in her mother.,  reports that she has never smoked. She has never used smokeless tobacco. She reports that she does not drink alcohol and does not use drugs.  She has a current medication list which includes the following prescription(s): albuterol , amlodipine , aspirin  ec, atorvastatin , guaifenesin -codeine , metoprolol  succinate, and azithromycin . Also, is allergic to sulfa antibiotics, statins, telmisartan , zetia  [ezetimibe ], penicillins, and rosuvastatin .  ROS  Objective: BP (!) 154/75   Pulse 62   Wt 131 lb (59.4 kg)   BMI 26.46 kg/m  Physical Exam Constitutional:      Appearance: Normal  appearance.  Genitourinary:     Vulva normal.     No lesions in the vagina.     No vaginal discharge, tenderness, bleeding, ulceration or granulation tissue.     Anterior and apical vaginal prolapse present. HENT:     Head: Normocephalic and atraumatic.  Eyes:     Extraocular Movements: Extraocular movements intact.  Pulmonary:     Effort: Pulmonary effort is normal.  Musculoskeletal:        General: Normal range of motion.  Neurological:     General: No focal deficit present.     Mental Status: She is alert.  Psychiatric:        Mood and Affect: Mood normal.  Vitals and nursing note reviewed. Exam conducted with a chaperone present.     Pessary Care Pessary removed and cleaned.  Vagina checked - without erosions - pessary replaced.  A/P: 73 y.o. [id number] with cystocele and Grade I uterine prolapse here for 38m pessary check after being fitted, doing well.  -#3 ring Pessary was cleaned and replaced today. -Concerning symptoms to observe for are counseled to patient. -Follow up scheduled for 3 months.   Estil Mangle, DO [facility]

## 2024-02-17 NOTE — Patient Instructions (Addendum)
 Lengthen Back rib by L  shoulder ( winging)  Lie on R  side , pillow between knees and under head  Pull  L arm overhead over mattress, grab the edge of mattress,pull it upward, drawing elbow away from ears  Breathing 10 reps Brushing arm with 3/4 turn onto pillow behind back  Lying on R  side ,Pillow/ Block between knees  dragging top forearm across ribs below breast rotating 3/4 turn,  rotating  _L_ only this week ,  relax onto the pillow behind the back  and then back to other palm , maintain top palm on body whole top and not lift shoulder Do this side this week       Wait do both sides until we have levelled out your spine and shoulders ___ Lengthen Back rib by R  shoulder   ( winging)  Lie on L  side , pillow between knees and under head  Pull  R arm overhead over mattress, grab the edge of mattress,pull it upward, drawing elbow away from ears  Breathing 10 reps Brushing arm with 3/4 turn onto pillow behind back  Lying on L  side ,Pillow/ Block between knees  dragging top forearm across ribs below breast rotating 3/4 turn,  rotating  _R_ only this week ,  relax onto the pillow behind the back  and then back to other palm , maintain top palm on body whole top and not lift shoulder Do this side this week       Wait do both sides until we have levelled out your spine   __   ZigZag stretch  Reclined twist for hips and side of the hips/ legs  Lay on your back, knees bend, dig elbows and feet into bed to exhale and lift buttocks up to  Scoot hips and feet  to the L , leave shoulders in place Wobble knees to the R side 45 deg and to midline  10 reps   Then reach for R thigh with R hand cross body, straighten knee and point toes toward face, bend knee and repeat straigthen 10 reps to lengthen hamstring and ITband (side of hip)   Repeat on other side: Scoot hip and feet  R ,  leave shoulders in place Wobble knees to the L side 45 deg and to midline  10 reps   __   Avoid  straining pelvic floor, abdominal muscles , spine  Use log rolling technique instead of getting out of bed with your neck or the sit-up     Log rolling into and out of bed   Log rolling into and out of bed If getting out of bed on R side, Bent knees, scoot hips/ shoulder to L  Raise R arm completely overhead, rolling onto armpit  Then lower bent knees to bed to get into complete side lying position  Then drop legs off bed, and push up onto R elbow/forearm, and use L hand to push onto the bed    Dig elbows and feet to lift hte buttocks and scoot without lifting head

## 2024-02-17 NOTE — Therapy (Addendum)
 OUTPATIENT PHYSICAL THERAPY TREATMENT    Patient Name: Erica Valencia MRN: [id number] DOB:Oct 21, 1950, 73 y.o., female Today's Date: 02/17/2024   PT End of Session - 02/17/24 1109     Visit Number 2    Number of Visits 10    Date for Recertification  04/20/24    PT Start Time 1105    PT Stop Time 1145    PT Time Calculation (min) 40 min    Activity Tolerance Patient tolerated treatment well;No increased pain    Behavior During Therapy [facility] for tasks assessed/performed          Past Medical History:  Diagnosis Date   Allergy    Bronchitis    Chest pain of uncertain etiology 09/18/2018   Coronary artery calcification seen on CT scan    a. 05/2021 Cardiac CT: Ca2+ = 108 (69th %'ile).   COVID-19 09/30/2020   Diastolic dysfunction    a. 11/2015 Echo: EF 60-65%, no rwma, GrI DD, nl RV fxn, trive MR/TR, RVSP .   Hemorrhoids    Hyperlipidemia    Hypertension    Scarlet fever    a. as child caused vision issues has to wear glasses now.   Sleep apnea    Syncope    a. 09/2020 Zio: Predominantly sinus rhythm @ 64 (41-203). 21 SVT runs, fastest 203 x 15 beats, longests 19 beats @ 99. Triggered events not assoc w/ signif arrhythmia.   Past Surgical History:  Procedure Laterality Date   APPENDECTOMY     age [age]   CARPAL TUNNEL RELEASE     left 2008   COLONOSCOPY WITH PROPOFOL  N/A 09/23/2021   Procedure: COLONOSCOPY WITH PROPOFOL ;  Surgeon: Maryruth Ole DASEN, MD;  Location: [facility] ENDOSCOPY;  Service: Endoscopy;  Laterality: N/A;   OTHER SURGICAL HISTORY     left great toe fusion 2012    Patient Active Problem List   Diagnosis Date Noted   Cystocele with first degree uterine prolapse 01/26/2024   Vaginal atrophy 09/13/2023   Pelvic pressure in female 09/13/2023   Female genital prolapse 06/29/2023   Vitamin B 12 deficiency 06/29/2023   Vitamin D  deficiency 06/29/2023   Hemorrhoids 12/02/2021   Statin intolerance    Prediabetes 11/15/2020   Annual physical exam  04/17/2020   Osteoporosis 01/25/2019   OSA (obstructive sleep apnea) 12/09/2018   Hyperlipidemia 12/16/2017   Essential hypertension 05/27/2017    PCP: [facility]   REFERRING PROVIDER: Leigh   REFERRING DIAG:  N95.2 (ICD-10-CM) - Vaginal atrophy R10.2 (ICD-10-CM) - Pelvic pressure in female  Rationale for Evaluation and Treatment Rehabilitation  THERAPY DIAG:  Sacrococcygeal disorders, not elsewhere classified  Abnormal posture  Other abnormalities of gait and mobility  ONSET DATE:   SUBJECTIVE:                 SUBJECTIVE STATEMENT  TODAY:                                                                             Pt has been doing her exercises. Pt noticed she is walking straigher.  SUBJECTIVE STATEMENT ON EVAL 02/10/24 : Prolapse : Pt noticed vaginal prolapse showing itself at the vaginal opening four months ago . Pt was moving her couch and felt the bulging after. Pt has been fitted for pessary and will be returning the 9/30 /25 to make sure it is in proper place. Pt reported she has since returned to exercises and walking with the help of the pessary.  Excercises:  walking 2 miles daily 5 days per week with stretching routine.   Routine she discovered online : seated twists, deep core squat,  No crunches ,  no sit ups   Daily bowel movements , straining 30% of the time,   PERTINENT HISTORY:    PAIN:  Are you having pain? No  PRECAUTIONS: None  WEIGHT BEARING RESTRICTIONS: No   FALLS:  Has patient fallen in last 6 months? 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   PLOF: IND   PATIENT GOALS:   Strengthen herself     OBJECTIVE:    [facility] PT Assessment - 02/17/24 1610       Palpation   SI assessment  levelled pelvis, L shoulder lowered, increased paraspinals, deviated  T10-12 L          [facility] Adult PT Treatment/Exercise - 02/17/24 1614       Therapeutic Activites    Therapeutic Activities Other Therapeutic Activities    Other Therapeutic Activities cued for logrolling and scooting to minimzie straining pelvic floor and spine      Neuro Re-ed    Neuro Re-ed Details  cued for T/L junction and lower back stretches to realign spine, cued for propicoeption and alignment      Manual Therapy   Manual therapy comments STM/MWM at T/L junction to realign spine and minimize forward head posture           HOME EXERCISE PROGRAM: See pt instruction section    ASSESSMENT:  CLINICAL IMPRESSION:        Improvements include:   Pt noticed she is walking straighter.   Pelvic alignment improved and was levelled. Manual Tx was applied to improve T/L junction hypomobility and to level out L shoulder height with R. Cued for T/L junction and lower back stretches to realign spine, cued for propicoeption and alignmentcued for logrolling and scooting to minimzie straining pelvic floor and spine . Pt showed improved alignment but more manual Tx will still be needed to further address asymmetrical tightness and deviations along spine. End of Tx, pt reported she noticed for the first time in a long time, she was able to lie on her back comfortably.        Anticipate these improvements for a more stacked posture  will  help promote optimize IAP system for improved pelvic floor function, trunk stability, gait, balance, stabilization with mobility tasks.  Plan to address pelvic floor issues once pelvis and spine are realigned to yield better outcomes.                  Regional interdependent approaches will yield greater benefits in pt's POC                                                     Pt benefits  from skilled PT.    OBJECTIVE IMPAIRMENTS decreased activity tolerance, decreased coordination, decreased endurance, decreased mobility, difficulty walking,  decreased ROM, decreased strength, decreased safety awareness, hypomobility, increased muscle spasms, impaired flexibility, improper body mechanics, postural dysfunction, and  scar restrictions   ACTIVITY LIMITATIONS  self-care,  sleep, home chores, ADLs  tasks    PARTICIPATION LIMITATIONS:  community, social activities    PERSONAL FACTORS   affecting patient's functional outcome:    REHAB POTENTIAL: Good   CLINICAL DECISION MAKING: Evolving/moderate complexity   EVALUATION COMPLEXITY: Moderate    PATIENT EDUCATION:    Education details: Showed pt anatomy images. Explained muscles attachments/ connection, physiology of deep core system/ spinal- thoracic-pelvis-lower kinetic chain as they relate to pt's presentation, Sx, and past Hx. Explained what and how these areas of deficits need to be restored to balance and function    See Therapeutic activity / neuromuscular re-education section  Answered pt's questions.   Person educated: Patient Education method: Explanation, Demonstration, Tactile cues, Verbal cues, and Handouts Education comprehension: verbalized understanding, returned demonstration, verbal cues required, tactile cues required, and needs further education     PLAN: PT FREQUENCY: 1x/week   PT DURATION: 10 weeks   PLANNED INTERVENTIONS:   Gait training;Stair training;Functional mobility training;DME Instruction;Therapeutic activities;Therapeutic exercise;Balance training;Neuromuscular re-education;Patient/family education;Vestibular;Visual/perceptual remediation/compensation;Passive range of motion;Moist Heat;Cryotherapy;Traction;Canalith Repostioning;Joint Manipulations;Manual lymph drainage;Manual techniques;Scar mobilization;Energy conservation;Dry needling;ADLs/Self Care Home Management;Biofeedback;Electrical Stimulation;Taping    PLAN FOR NEXT SESSION: See clinical impression for plan     GOALS: Goals reviewed with patient? Yes  SHORT TERM GOALS: Target  date: 03/09/2024    Pt will demo IND with HEP                    Baseline: Not IND            Goal status: INITIAL   LONG TERM GOALS: Target date:  04/20/2024     1.Pt will demo proper deep core coordination without chest breathing and optimal excursion of diaphragm/pelvic floor in order to promote spinal stability and pelvic floor function  Baseline: dyscoordination Goal status: INITIAL  2.  Pt will demo proper body mechanics in against gravity tasks and ADLs  work tasks, fitness  to minimize straining pelvic floor / back    Baseline: not IND, improper form that places strain on pelvic floor  Goal status: INITIAL    3. Pt will demo increased gait speed > 1.5 m/s with reciprocal gait pattern, longer stride length  in order to ambulate safely in community and return to fitness routine  Baseline: 1.34 m/s , decreased R stance  Goal status: INITIAL    4. Pt will demo levelled pelvic girdle and shoulder height and btieral sideflexion  in order to progress to deep core strengthening HEP and restore mobility at spine, pelvis, gait, posture minimize falls, and improve balance  Baseline: L shoulder and L iliac crest lowered  ,  sideflexion L 43cm, R 47 cm digit III to floor  Goal status: INITIAL   5. Pt will improve PFDI-7 questionnaire to  pts  score change  to demo improved QOL  Baseline:    ( greater pts indicate greater negative impact on QOL)   38  pts  ( total) 10  pts  ( UIQ-7 ) 14   pts  ( CRAIQ-7 ) 14   pts  ( POPIQ-7 )  Baseline:  Goal status: INITIAL  6. Ot will report no more straining with bowel movements and Type 4 across 100%  of time  Baseline: Daily bowel movements , straining 30% of the time, Stool Type 4 across 75% of the time  GOAL: INITIAL    7. Pt will be IND with using pessary and report comfort with physical activities across 2 months  Baseline: just started wearing Sept 2025  GOAL: Initial     Pia Lupe Plump, PT 02/17/2024, 11:09 AM

## 2024-02-17 NOTE — Patient Instructions (Signed)
 How to Use a Vaginal Pessary  A vaginal pessary is a device that's used to support pelvic organs that have dropped down into your vagina. These organs include your uterus, bladder, and rectum. When your pelvic organs drop down into your vagina, it's called pelvic organ prolapse (POP). A pessary may be used instead of surgery. It may help females who leak urine when they strain or exercise (stress incontinence). Or females who leak poop. A vaginal pessary may also be a temporary treatment for stress incontinence during pregnancy. What are other things to know about a pessary? There're several types of pessaries. All types are usually made of silicone. The right one for you depends on the reason you need a pessary, how bad your condition is, and whether you're sexually active. It's important to find a pessary that's the right size. A pessary that's too small may fall out. A pessary that is too large may cause pain, discomfort, and sores on the inside of the vagina. Your health care provider will do a physical exam to find the correct size and fit for your pessary. It may take several visits to find the best fit for you. You can insert and remove some pessaries on your own. If you have this type of pessary, your provider will teach you how to use, clean, and care for your device at home. You'll have check-ups every few months. Your provider must insert and remove some other types of pessaries. If you have this type of pessary, you'll have follow-up visits every few months to have the device removed, cleaned, and replaced. What are the risks? The risks are low when the pessary is properly fitted and cared for. However, there can be problems that include: Fluid coming from your vagina (discharge). Bleeding from your vagina. A bad smell coming from your vagina. Ulcers or skin erosion. Infection. How to use your pessary Follow your provider's instructions for using a pessary. These instructions may vary,  depending on the type of pessary you have. To insert it, you can try lying down with your legs apart, or standing with one leg up on the tub or a step. To insert a pessary: Wash your hands with soap and water for at least 20 seconds. Squeeze or fold the pessary in half and lubricate the tip with a water-based lubricant. Insert the pessary into your vagina. It will unfold and provide support. To remove the pessary, gently pull it out of your vagina. You can remove the pessary every night or after several days. You can also remove it to have sex. Some devices may have metal and will need to be removed before an X-ray or MRI. Like a tampon, you should not be able to feel the pessary while it is inserted. If you do, it may not be the correct size. How to care for your pessary If you have a pessary that can be removed: Clean your pessary with soap and water mixture. Let it soak in the mixture for 5 minutes. Clean the device with a toothbrush or small scrub brush in the soap and water mixture. Rinse the device under running water for at least 30 seconds. Dry it completely before inserting it back into your vagina. Ask your provider about douches or any cleaners that you use in your vagina. These may not be good for the silicone pessary. Follow these instructions at home: Take your medicines only as told. Your provider may prescribe an estrogen cream to strengthen the tissue in your  vagina. Keep all follow-up visits. Your provider may need to check that the pessary is fitting and working correctly. Contact a health care provider if: You feel any pain or discomfort when your pessary is in place. You continue to leak pee when you cough, laugh, or strain. You have trouble keeping your pessary from falling out. You have a smelly, blood-tinged fluid coming from your vagina. This information is not intended to replace advice given to you by your health care provider. Make sure you discuss any questions  you have with your health care provider. Document Revised: 04/13/2023 Document Reviewed: 04/13/2023 Elsevier Patient Education  2025 [employer].

## 2024-02-22 ENCOUNTER — Encounter: Admitting: Physical Therapy

## 2024-02-22 ENCOUNTER — Encounter: Payer: Self-pay | Admitting: Obstetrics

## 2024-02-22 ENCOUNTER — Ambulatory Visit (INDEPENDENT_AMBULATORY_CARE_PROVIDER_SITE_OTHER): Admitting: Obstetrics

## 2024-02-22 VITALS — BP 154/75 | HR 62 | Wt 131.0 lb

## 2024-02-22 DIAGNOSIS — N812 Incomplete uterovaginal prolapse: Secondary | ICD-10-CM | POA: Diagnosis not present

## 2024-02-22 DIAGNOSIS — Z4689 Encounter for fitting and adjustment of other specified devices: Secondary | ICD-10-CM

## 2024-02-23 ENCOUNTER — Encounter: Admitting: Physical Therapy

## 2024-02-24 ENCOUNTER — Ambulatory Visit: Admitting: Physical Therapy

## 2024-02-24 DIAGNOSIS — R2689 Other abnormalities of gait and mobility: Secondary | ICD-10-CM | POA: Diagnosis not present

## 2024-02-24 DIAGNOSIS — R293 Abnormal posture: Secondary | ICD-10-CM | POA: Diagnosis not present

## 2024-02-24 DIAGNOSIS — M533 Sacrococcygeal disorders, not elsewhere classified: Secondary | ICD-10-CM | POA: Diagnosis not present

## 2024-02-24 NOTE — Patient Instructions (Signed)
 Scooting in bed with points of contact without lifting head  __ At rest stops from driving:   kitchen counter stretches  Hands on kitchen counter,   Palms shoulder width apart  Minisquat postion Trunk is parallel to floor  A) Pull buttocks back to lengthen spine, knees bent  3 breaths   B) Bring R hand to the L, and stretch the R side trunk  3 breaths   Brings hands to center again Do the same to the L side stretch by placing L hand on top of R    C) L hand on L hip, rotate rib and look up at the ceiling L  ( for this week to realign spine)    Later, you can do both sides  ____  Seated cat and cow  Elbows back, hands dragging on thigh  __   Car seat modification  Folded towels to fill bucket seat Folded beach towel long ways against back of seat into head rest to fill in the space so your head and back and sacrum aligns  Hands at 9 and 3 o clock  Feet and heel on the mat  Back of the hips against the seat evenly

## 2024-02-24 NOTE — Therapy (Signed)
 OUTPATIENT PHYSICAL THERAPY TREATMENT    Patient Name: Erica Valencia MRN: [id number] DOB:21-Aug-1950, 73 y.o., female Today's Date: 02/24/2024   PT End of Session - 02/24/24 0939     Visit Number 3    Number of Visits 10    Date for Recertification  04/20/24    PT Start Time 0936    PT Stop Time 1015    PT Time Calculation (min) 39 min    Activity Tolerance Patient tolerated treatment well;No increased pain    Behavior During Therapy [facility] for tasks assessed/performed          Past Medical History:  Diagnosis Date   Allergy    Bronchitis    Chest pain of uncertain etiology 09/18/2018   Coronary artery calcification seen on CT scan    a. 05/2021 Cardiac CT: Ca2+ = 108 (69th %'ile).   COVID-19 09/30/2020   Diastolic dysfunction    a. 11/2015 Echo: EF 60-65%, no rwma, GrI DD, nl RV fxn, trive MR/TR, RVSP .   Hemorrhoids    Hyperlipidemia    Hypertension    Scarlet fever    a. as child caused vision issues has to wear glasses now.   Sleep apnea    Syncope    a. 09/2020 Zio: Predominantly sinus rhythm @ 64 (41-203). 21 SVT runs, fastest 203 x 15 beats, longests 19 beats @ 99. Triggered events not assoc w/ signif arrhythmia.   Past Surgical History:  Procedure Laterality Date   APPENDECTOMY     age [age]   CARPAL TUNNEL RELEASE     left 2008   COLONOSCOPY WITH PROPOFOL  N/A 09/23/2021   Procedure: COLONOSCOPY WITH PROPOFOL ;  Surgeon: Maryruth Ole DASEN, MD;  Location: [facility] ENDOSCOPY;  Service: Endoscopy;  Laterality: N/A;   OTHER SURGICAL HISTORY     left great toe fusion 2012    Patient Active Problem List   Diagnosis Date Noted   Cystocele with first degree uterine prolapse 01/26/2024   Vaginal atrophy 09/13/2023   Pelvic pressure in female 09/13/2023   Female genital prolapse 06/29/2023   Vitamin B 12 deficiency 06/29/2023   Vitamin D  deficiency 06/29/2023   Hemorrhoids 12/02/2021   Statin intolerance    Prediabetes 11/15/2020   Annual physical exam  04/17/2020   Osteoporosis 01/25/2019   OSA (obstructive sleep apnea) 12/09/2018   Hyperlipidemia 12/16/2017   Essential hypertension 05/27/2017    PCP: [facility]   REFERRING PROVIDER: Leigh   REFERRING DIAG:  N95.2 (ICD-10-CM) - Vaginal atrophy R10.2 (ICD-10-CM) - Pelvic pressure in female  Rationale for Evaluation and Treatment Rehabilitation  THERAPY DIAG:  Sacrococcygeal disorders, not elsewhere classified  Abnormal posture  Other abnormalities of gait and mobility  ONSET DATE:   SUBJECTIVE:                 SUBJECTIVE STATEMENT  TODAY:                                                                             Pt noticed she slept in a harder bed and think maybe it caused a little R hip pain. It went away after doing exercises.  SUBJECTIVE STATEMENT ON EVAL 02/10/24 : Prolapse : Pt noticed vaginal prolapse showing itself at the vaginal opening four months ago . Pt was moving her couch and felt the bulging after. Pt has been fitted for pessary and will be returning the 9/30 /25 to make sure it is in proper place. Pt reported she has since returned to exercises and walking with the help of the pessary.  Excercises:  walking 2 miles daily 5 days per week with stretching routine.   Routine she discovered online : seated twists, deep core squat,  No crunches ,  no sit ups   Daily bowel movements , straining 30% of the time,   PERTINENT HISTORY:    PAIN:  Are you having pain? No  PRECAUTIONS: None  WEIGHT BEARING RESTRICTIONS: No   FALLS:  Has patient fallen in last 6 months? 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 interspinal tightness , intercostal tightness between T9-12 L      Bed Mobility   Bed Mobility --   lifting head when scooting in bed         [facility] Adult PT Treatment/Exercise - 02/24/24 1036       Self-Care   Self-Care Other Self-Care Comments    Other Self-Care Comments  discussed and showed modificaiton to car seat  to minimzie forward head posture.      Therapeutic Activites    Other Therapeutic Activities cued for kitchen counter stretch w/ propioception / alignment  to minimize thoracic kyphosis and to lengthen spine with ADL tasks. reviewed her technique for past HEP which was incorrect and cued for correct technique      Neuro Re-ed    Neuro Re-ed Details  cued for T/L junction and lower back stretches to realign spine, cued for propicoeption and alignment ,      Manual Therapy   Manual therapy comments STM/MWM at T/L junction to realign spine and optimize diaphragmatic excursion            HOME EXERCISE PROGRAM: See pt instruction section    ASSESSMENT:  CLINICAL IMPRESSION:        Pt showed relapse with lowered L shoulder and pelvis again today. Required more manual Tx to thoracic area to promote realignment, less thoracic kyphosis/ optimal diaphragmatic excursion. Pt showed levelled pelvis again post Tx. Plan to continue with L medial scapular area manual Tx next session.   Cued for correct technique with past HEP which pt demo'd correctly post training. Cued for kitchen counter stretch  w/ propioception / alignment to minimize thoracic kyphosis and to lengthen spine with ADL tasks. Discussed and showed modificaiton to car seat  to minimzie forward head posture because pt is going on a 8 hr  car trip to visit family after today's session.    Cued for scooting with propicoeption and stabilization to minimzie straining pelvic  floor and spine .   Plan to add neck ROM and cervicoscapular stabilization strenghteing to minimize thoracic kyphosis and maintain proper alignment of spine. Then progress to deep core training   Anticipate these improvements for a more stacked posture  will  help promote optimize IAP system for improved pelvic floor function, trunk stability, gait, balance, stabilization with mobility tasks.  Plan to address pelvic floor issues once pelvis and spine are realigned to yield better outcomes.                  Regional interdependent approaches will yield greater benefits in pt's POC                                                     Pt benefits from skilled PT.    OBJECTIVE IMPAIRMENTS decreased activity tolerance, decreased coordination, decreased endurance, decreased mobility, difficulty walking, decreased ROM, decreased strength, decreased safety awareness, hypomobility, increased muscle spasms, impaired flexibility, improper body mechanics, postural dysfunction, and  scar restrictions   ACTIVITY LIMITATIONS  self-care,  sleep, home chores, ADLs  tasks    PARTICIPATION LIMITATIONS:  community, social activities    PERSONAL FACTORS   affecting patient's functional outcome:    REHAB POTENTIAL: Good   CLINICAL DECISION MAKING: Evolving/moderate complexity   EVALUATION COMPLEXITY: Moderate    PATIENT EDUCATION:    Education details: Showed pt anatomy images. Explained muscles attachments/ connection, physiology of deep core system/ spinal- thoracic-pelvis-lower kinetic chain as they relate to pt's presentation, Sx, and past Hx. Explained what and how these areas of deficits need to be restored to balance and function    See Therapeutic activity / neuromuscular re-education section  Answered pt's questions.   Person educated: Patient Education  method: Explanation, Demonstration, Tactile cues, Verbal cues, and Handouts Education comprehension: verbalized understanding, returned demonstration, verbal cues required, tactile cues required, and needs further education     PLAN: PT FREQUENCY: 1x/week   PT DURATION: 10 weeks   PLANNED INTERVENTIONS:   Gait training;Stair training;Functional mobility training;DME Instruction;Therapeutic activities;Therapeutic exercise;Balance training;Neuromuscular re-education;Patient/family education;Vestibular;Visual/perceptual remediation/compensation;Passive range of motion;Moist Heat;Cryotherapy;Traction;Canalith Repostioning;Joint Manipulations;Manual lymph drainage;Manual techniques;Scar mobilization;Energy conservation;Dry needling;ADLs/Self Care Home Management;Biofeedback;Electrical Stimulation;Taping    PLAN FOR NEXT SESSION: See clinical impression for plan     GOALS: Goals reviewed with patient? Yes  SHORT TERM GOALS: Target date: 03/09/2024    Pt will demo IND with HEP                    Baseline: Not IND            Goal status: INITIAL   LONG TERM GOALS: Target date:  04/20/2024     1.Pt will demo proper deep core coordination without chest breathing and optimal excursion of diaphragm/pelvic floor in order to promote spinal stability and pelvic floor function  Baseline: dyscoordination Goal status: INITIAL  2.  Pt will demo proper body mechanics in against gravity tasks and ADLs  work tasks, fitness  to minimize straining pelvic floor / back    Baseline: not IND, improper form that places strain on pelvic floor  Goal status: INITIAL    3. Pt will demo increased gait speed > 1.5 m/s with reciprocal gait pattern, longer stride length  in order to ambulate safely in community and return to fitness routine  Baseline: 1.34 m/s ,  decreased R stance  Goal status: INITIAL    4. Pt will demo levelled pelvic girdle and shoulder height and btieral sideflexion  in order to  progress to deep core strengthening HEP and restore mobility at spine, pelvis, gait, posture minimize falls, and improve balance  Baseline: L shoulder and L iliac crest lowered  ,  sideflexion L 43cm, R 47 cm digit III to floor  Goal status: INITIAL   5. Pt will improve PFDI-7 questionnaire to  pts  score change  to demo improved QOL  Baseline:    ( greater pts indicate greater negative impact on QOL)   38  pts  ( total) 10  pts  ( UIQ-7 ) 14   pts  ( CRAIQ-7 ) 14   pts  ( POPIQ-7 )  Baseline:  Goal status: INITIAL  6. Ot will report no more straining with bowel movements and Type 4 across 100% of time  Baseline: Daily bowel movements , straining 30% of the time, Stool Type 4 across 75% of the time  GOAL: INITIAL    7. Pt will be IND with using pessary and report comfort with physical activities across 2 months  Baseline: just started wearing Sept 2025  GOAL: Initial     Pia Lupe Plump, PT 02/24/2024, 9:39 AM

## 2024-02-29 ENCOUNTER — Encounter: Admitting: Physical Therapy

## 2024-03-01 ENCOUNTER — Encounter: Admitting: Physical Therapy

## 2024-03-02 ENCOUNTER — Ambulatory Visit: Admitting: Physical Therapy

## 2024-03-02 DIAGNOSIS — R293 Abnormal posture: Secondary | ICD-10-CM

## 2024-03-02 DIAGNOSIS — M533 Sacrococcygeal disorders, not elsewhere classified: Secondary | ICD-10-CM

## 2024-03-02 DIAGNOSIS — R2689 Other abnormalities of gait and mobility: Secondary | ICD-10-CM

## 2024-03-02 NOTE — Therapy (Signed)
 OUTPATIENT PHYSICAL THERAPY TREATMENT    Patient Name: Erica Valencia MRN: [id number] DOB:April 06, 1951, 73 y.o., female Today's Date: 03/02/2024   PT End of Session - 03/02/24 1108     Visit Number 4    Number of Visits 10    Date for Recertification  04/20/24    PT Start Time 1105    PT Stop Time 1145    PT Time Calculation (min) 40 min    Activity Tolerance Patient tolerated treatment well;No increased pain    Behavior During Therapy [facility] for tasks assessed/performed          Past Medical History:  Diagnosis Date   Allergy    Bronchitis    Chest pain of uncertain etiology 09/18/2018   Coronary artery calcification seen on CT scan    a. 05/2021 Cardiac CT: Ca2+ = 108 (69th %'ile).   COVID-19 09/30/2020   Diastolic dysfunction    a. 11/2015 Echo: EF 60-65%, no rwma, GrI DD, nl RV fxn, trive MR/TR, RVSP .   Hemorrhoids    Hyperlipidemia    Hypertension    Scarlet fever    a. as child caused vision issues has to wear glasses now.   Sleep apnea    Syncope    a. 09/2020 Zio: Predominantly sinus rhythm @ 64 (41-203). 21 SVT runs, fastest 203 x 15 beats, longests 19 beats @ 99. Triggered events not assoc w/ signif arrhythmia.   Past Surgical History:  Procedure Laterality Date   APPENDECTOMY     age [age]   CARPAL TUNNEL RELEASE     left 2008   COLONOSCOPY WITH PROPOFOL  N/A 09/23/2021   Procedure: COLONOSCOPY WITH PROPOFOL ;  Surgeon: Maryruth Ole DASEN, MD;  Location: [facility] ENDOSCOPY;  Service: Endoscopy;  Laterality: N/A;   OTHER SURGICAL HISTORY     left great toe fusion 2012    Patient Active Problem List   Diagnosis Date Noted   Cystocele with first degree uterine prolapse 01/26/2024   Vaginal atrophy 09/13/2023   Pelvic pressure in female 09/13/2023   Female genital prolapse 06/29/2023   Vitamin B 12 deficiency 06/29/2023   Vitamin D  deficiency 06/29/2023   Hemorrhoids 12/02/2021   Statin intolerance    Prediabetes 11/15/2020   Annual physical exam  04/17/2020   Osteoporosis 01/25/2019   OSA (obstructive sleep apnea) 12/09/2018   Hyperlipidemia 12/16/2017   Essential hypertension 05/27/2017    PCP: [facility]   REFERRING PROVIDER: Leigh   REFERRING DIAG:  N95.2 (ICD-10-CM) - Vaginal atrophy R10.2 (ICD-10-CM) - Pelvic pressure in female  Rationale for Evaluation and Treatment Rehabilitation  THERAPY DIAG:  Sacrococcygeal disorders, not elsewhere classified  Abnormal posture  Other abnormalities of gait and mobility  ONSET DATE:   SUBJECTIVE:                 SUBJECTIVE STATEMENT  TODAY:                                                                             Pt traveled nd had ot take laxatives to ensure better bowel movements.  Pt used the towel technique for car seated during the drive and it helped her with her posture  Pt notices after performing her HEP, she noticed her BP has decreased and helpful for her high BP                            SUBJECTIVE STATEMENT ON EVAL 02/10/24 : Prolapse : Pt noticed vaginal prolapse showing itself at the vaginal opening four months ago . Pt was moving her couch and felt the bulging after. Pt has been fitted for pessary and will be returning the 9/30 /25 to make sure it is in proper place. Pt reported she has since returned to exercises and walking with the help of the pessary.  Excercises:  walking 2 miles daily 5 days per week with stretching routine.   Routine she discovered online : seated twists, deep core squat,  No crunches ,  no sit ups   Daily bowel movements , straining 30% of the time,   PERTINENT HISTORY:    PAIN:  Are you having pain? No  PRECAUTIONS: None  WEIGHT BEARING RESTRICTIONS: No   FALLS:  Has patient fallen in last 6 months? 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   PLOF: IND   PATIENT GOALS:   Strengthen herself      OBJECTIVE:     [facility] PT Assessment - 03/02/24 1216       Sit to Stand   Comments no more cues required, no more adducted knees      Other:   Other/Comments poor propioception of head position in relationship to pillow under head, required cues for adjusting poillow position to minimize forward head / throacic kyphosis      Palpation   SI assessment  levelled pelvis and shoulders , thoracic kyphosis still present          [facility] Adult PT Treatment/Exercise - 03/02/24 1213       Self-Care   Other Self-Care Comments  provided rest break after new HEP ( unbilled) , guided transfers incrementally to further promote relaxation        Neuro Re-ed    Neuro Re-ed Details  cued for propioception for cervicoscapular stabilization and retraction with red band in new HEP to promote stacked posture, less thoracic kyphosis / more TrA co-activation with deep core, cued or pelvic anterior tilt for optimal pelvic organ position    required cues for adjusting pillow position to minimize forward head / thoracic kyphosis      Exercises   Exercises Other Exercises    Other Exercises  see pt instructions            HOME EXERCISE PROGRAM: See pt instruction section    ASSESSMENT:  CLINICAL IMPRESSION:       Pt showed levelled shoulders and pelvis today and advanced to cervicoscapular stabilization /strengthening exercises. Cued for propioception for cervicoscapular stabilization and retraction with red band in new HEP to promote stacked posture, less thoracic kyphosis / more TrA co-activation with deep core, cued or pelvic anterior tilt for optimal pelvic organ position.  Required cues for adjusting pillow position to minimize forward head / thoracic kyphosis   Plan to add neck ROM to minimize thoracic kyphosis and maintain proper alignment of spine. Then progress to deep core training   Anticipate these improvements for a more stacked posture  will  help promote optimize IAP  system for improved pelvic floor function, trunk stability,  gait, balance, stabilization with mobility tasks.  Plan to address pelvic floor issues once pelvis and spine are realigned to yield better outcomes.                  Regional interdependent approaches will yield greater benefits in pt's POC                                                     Pt benefits from skilled PT.    OBJECTIVE IMPAIRMENTS decreased activity tolerance, decreased coordination, decreased endurance, decreased mobility, difficulty walking, decreased ROM, decreased strength, decreased safety awareness, hypomobility, increased muscle spasms, impaired flexibility, improper body mechanics, postural dysfunction, and  scar restrictions   ACTIVITY LIMITATIONS  self-care,  sleep, home chores, ADLs  tasks    PARTICIPATION LIMITATIONS:  community, social activities    PERSONAL FACTORS   affecting patient's functional outcome:    REHAB POTENTIAL: Good   CLINICAL DECISION MAKING: Evolving/moderate complexity   EVALUATION COMPLEXITY: Moderate    PATIENT EDUCATION:    Education details: Showed pt anatomy images. Explained muscles attachments/ connection, physiology of deep core system/ spinal- thoracic-pelvis-lower kinetic chain as they relate to pt's presentation, Sx, and past Hx. Explained what and how these areas of deficits need to be restored to balance and function    See Therapeutic activity / neuromuscular re-education section  Answered pt's questions.   Person educated: Patient Education method: Explanation, Demonstration, Tactile cues, Verbal cues, and Handouts Education comprehension: verbalized understanding, returned demonstration, verbal cues required, tactile cues required, and needs further education     PLAN: PT FREQUENCY: 1x/week   PT DURATION: 10 weeks   PLANNED INTERVENTIONS:   Gait training;Stair training;Functional mobility training;DME Instruction;Therapeutic activities;Therapeutic  exercise;Balance training;Neuromuscular re-education;Patient/family education;Vestibular;Visual/perceptual remediation/compensation;Passive range of motion;Moist Heat;Cryotherapy;Traction;Canalith Repostioning;Joint Manipulations;Manual lymph drainage;Manual techniques;Scar mobilization;Energy conservation;Dry needling;ADLs/Self Care Home Management;Biofeedback;Electrical Stimulation;Taping    PLAN FOR NEXT SESSION: See clinical impression for plan     GOALS: Goals reviewed with patient? Yes  SHORT TERM GOALS: Target date: 03/09/2024    Pt will demo IND with HEP                    Baseline: Not IND            Goal status: INITIAL   LONG TERM GOALS: Target date:  04/20/2024     1.Pt will demo proper deep core coordination without chest breathing and optimal excursion of diaphragm/pelvic floor in order to promote spinal stability and pelvic floor function  Baseline: dyscoordination Goal status: INITIAL  2.  Pt will demo proper body mechanics in against gravity tasks and ADLs  work tasks, fitness  to minimize straining pelvic floor / back    Baseline: not IND, improper form that places strain on pelvic floor  Goal status: INITIAL    3. Pt will demo increased gait speed > 1.5 m/s with reciprocal gait pattern, longer stride length  in order to ambulate safely in community and return to fitness routine  Baseline: 1.34 m/s , decreased R stance  Goal status: INITIAL    4. Pt will demo levelled pelvic girdle and shoulder height and btieral sideflexion  in order to progress to deep core strengthening HEP and restore mobility at spine, pelvis, gait, posture minimize falls, and improve balance  Baseline: L shoulder and L iliac crest lowered  ,  sideflexion L 43cm, R 47 cm digit III to floor  Goal status: INITIAL   5. Pt will improve PFDI-7 questionnaire to  pts  score change  to demo improved QOL  Baseline:    ( greater pts indicate greater negative impact on QOL)   38  pts  (  total) 10  pts  ( UIQ-7 ) 14   pts  ( CRAIQ-7 ) 14   pts  ( POPIQ-7 )  Baseline:  Goal status: INITIAL  6. Ot will report no more straining with bowel movements and Type 4 across 100% of time  Baseline: Daily bowel movements , straining 30% of the time, Stool Type 4 across 75% of the time  GOAL: INITIAL    7. Pt will be IND with using pessary and report comfort with physical activities across 2 months  Baseline: just started wearing Sept 2025  GOAL: Initial     Pia Lupe Plump, PT 03/02/2024, 11:08 AM

## 2024-03-02 NOTE — Patient Instructions (Signed)
 Place band in U    band under ballmounds  while laying on back w/ knees bent   Lying on back, knees bent    band under ballmounds  while laying on back w/ knees bent  W exercise 30 reps   Band is placed under feet, knees bent, feet are hip width apart Hold band with thumbs point out, keep upper arm and elbow touching the bed the whole time  - inhale and then exhale pull bands by bending elbows hands move in a w  (feel shoulder blades squeezing)   ________________   [place] hiking movement   Opposite arm   Place band in U    band under ballmounds  while laying on back w/ knees bent     30 reps  on each side  Holding band from opposite thigh,  Inhale,    exhale then pull band across body while keeping elbow , shoulders, back of the head pressed down    ______________

## 2024-03-06 ENCOUNTER — Encounter: Admitting: Physical Therapy

## 2024-03-07 ENCOUNTER — Encounter: Admitting: Physical Therapy

## 2024-03-08 ENCOUNTER — Encounter: Admitting: Physical Therapy

## 2024-03-13 ENCOUNTER — Ambulatory Visit: Admitting: Physical Therapy

## 2024-03-13 DIAGNOSIS — M533 Sacrococcygeal disorders, not elsewhere classified: Secondary | ICD-10-CM | POA: Diagnosis not present

## 2024-03-13 DIAGNOSIS — R2689 Other abnormalities of gait and mobility: Secondary | ICD-10-CM | POA: Diagnosis not present

## 2024-03-13 DIAGNOSIS — R293 Abnormal posture: Secondary | ICD-10-CM

## 2024-03-13 NOTE — Therapy (Signed)
 OUTPATIENT PHYSICAL THERAPY TREATMENT    Patient Name: Erica Valencia MRN: [id number] DOB:1950/10/30, 73 y.o., female Today's Date: 03/13/2024   PT End of Session - 03/13/24 1139     Visit Number 5    Number of Visits 10    Date for Recertification  04/20/24    PT Start Time 1105    PT Stop Time 1150    PT Time Calculation (min) 45 min    Activity Tolerance Patient tolerated treatment well;No increased pain    Behavior During Therapy [facility] for tasks assessed/performed          Past Medical History:  Diagnosis Date   Allergy    Bronchitis    Chest pain of uncertain etiology 09/18/2018   Coronary artery calcification seen on CT scan    a. 05/2021 Cardiac CT: Ca2+ = 108 (69th %'ile).   COVID-19 09/30/2020   Diastolic dysfunction    a. 11/2015 Echo: EF 60-65%, no rwma, GrI DD, nl RV fxn, trive MR/TR, RVSP .   Hemorrhoids    Hyperlipidemia    Hypertension    Scarlet fever    a. as child caused vision issues has to wear glasses now.   Sleep apnea    Syncope    a. 09/2020 Zio: Predominantly sinus rhythm @ 64 (41-203). 21 SVT runs, fastest 203 x 15 beats, longests 19 beats @ 99. Triggered events not assoc w/ signif arrhythmia.   Past Surgical History:  Procedure Laterality Date   APPENDECTOMY     age [age]   CARPAL TUNNEL RELEASE     left 2008   COLONOSCOPY WITH PROPOFOL  N/A 09/23/2021   Procedure: COLONOSCOPY WITH PROPOFOL ;  Surgeon: Maryruth Ole DASEN, MD;  Location: [facility] ENDOSCOPY;  Service: Endoscopy;  Laterality: N/A;   OTHER SURGICAL HISTORY     left great toe fusion 2012    Patient Active Problem List   Diagnosis Date Noted   Cystocele with first degree uterine prolapse 01/26/2024   Vaginal atrophy 09/13/2023   Pelvic pressure in female 09/13/2023   Female genital prolapse 06/29/2023   Vitamin B 12 deficiency 06/29/2023   Vitamin D  deficiency 06/29/2023   Hemorrhoids 12/02/2021   Statin intolerance    Prediabetes 11/15/2020   Annual physical exam  04/17/2020   Osteoporosis 01/25/2019   OSA (obstructive sleep apnea) 12/09/2018   Hyperlipidemia 12/16/2017   Essential hypertension 05/27/2017    PCP: B[facility]   REFERRING PROVIDER: Leigh   REFERRING DIAG:  N95.2 (ICD-10-CM) - Vaginal atrophy R10.2 (ICD-10-CM) - Pelvic pressure in female  Rationale for Evaluation and Treatment Rehabilitation  THERAPY DIAG:  Sacrococcygeal disorders, not elsewhere classified  Abnormal posture  Other abnormalities of gait and mobility  ONSET DATE:   SUBJECTIVE:                 SUBJECTIVE STATEMENT  TODAY:                                                                             Pt was not able to do her exercises this morning    SUBJECTIVE STATEMENT ON EVAL 02/10/24 : Prolapse : Pt noticed vaginal prolapse showing itself at the vaginal opening four months ago . Pt was  moving her couch and felt the bulging after. Pt has been fitted for pessary and will be returning the 9/30 /25 to make sure it is in proper place. Pt reported she has since returned to exercises and walking with the help of the pessary.  Excercises:  walking 2 miles daily 5 days per week with stretching routine.   Routine she discovered online : seated twists, deep core squat,  No crunches ,  no sit ups   Daily bowel movements , straining 30% of the time,   PERTINENT HISTORY:    PAIN:  Are you having pain? No  PRECAUTIONS: None  WEIGHT BEARING RESTRICTIONS: No   FALLS:  Has patient fallen in last 6 months? 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   PLOF: IND   PATIENT GOALS:   Strengthen herself     OBJECTIVE:     [facility] PT Assessment - 03/13/24 1144       Coordination   Coordination and Movement Description ab and chest overuse with deep core training      Palpation   SI assessment  levelled pelvis but L shoudler still lowered with posterior rotation     Palpation comment tightness and hypomobile T5-12, tightness at medial scapular area           [facility] Adult PT Treatment/Exercise - 03/13/24 1141       Neuro Re-ed    Neuro Re-ed Details  cued for propioception and sequence for deep core training level 1-2, cued for less chest breathing , required measure tape for tactile awareness for diaphragm excursions instead upper chest breathing   Cued for spinal alignment in sidelying sleeping position instead of fetal position which will help with the hunch back posture      Manual Therapy   Manual therapy comments STM/MWM at T/L junction to realign spine and optimize diaphragmatic excursion               HOME EXERCISE PROGRAM: See pt instruction section    ASSESSMENT:  CLINICAL IMPRESSION:       Pt showed relapse of L lowered shoulder today but pelvic alignment showed levelled alignment.   Manual Tx was applied to address hypomobility at thoracic segments and optimize diaphragmatic excursion.   Advanced to deep core training today with cue for propioception and sequence for deep core training level 1-2, cued for less chest breathing , required measure tape for tactile awareness for diaphragm excursions instead upper chest breathing    Cued for spinal alignment in sidelying sleeping position instead of fetal position which will help with the hunch back posture   Plan to reassess deep core coordination and add neck ROM to minimize thoracic kyphosis and maintain proper alignment of spine.   Anticipate these improvements for a more stacked posture  will  help promote optimize IAP system for improved pelvic floor function, trunk stability, gait, balance, stabilization with mobility tasks.  Plan to address pelvic floor issues once pelvis and spine are realigned to yield better outcomes.                  Regional interdependent approaches will yield greater benefits in pt's POC  Pt  benefits from skilled PT.    OBJECTIVE IMPAIRMENTS decreased activity tolerance, decreased coordination, decreased endurance, decreased mobility, difficulty walking, decreased ROM, decreased strength, decreased safety awareness, hypomobility, increased muscle spasms, impaired flexibility, improper body mechanics, postural dysfunction, and  scar restrictions   ACTIVITY LIMITATIONS  self-care,  sleep, home chores, ADLs  tasks    PARTICIPATION LIMITATIONS:  community, social activities    PERSONAL FACTORS   affecting patient's functional outcome:    REHAB POTENTIAL: Good   CLINICAL DECISION MAKING: Evolving/moderate complexity   EVALUATION COMPLEXITY: Moderate    PATIENT EDUCATION:    Education details: Showed pt anatomy images. Explained muscles attachments/ connection, physiology of deep core system/ spinal- thoracic-pelvis-lower kinetic chain as they relate to pt's presentation, Sx, and past Hx. Explained what and how these areas of deficits need to be restored to balance and function    See Therapeutic activity / neuromuscular re-education section  Answered pt's questions.   Person educated: Patient Education method: Explanation, Demonstration, Tactile cues, Verbal cues, and Handouts Education comprehension: verbalized understanding, returned demonstration, verbal cues required, tactile cues required, and needs further education     PLAN: PT FREQUENCY: 1x/week   PT DURATION: 10 weeks   PLANNED INTERVENTIONS:   Gait training;Stair training;Functional mobility training;DME Instruction;Therapeutic activities;Therapeutic exercise;Balance training;Neuromuscular re-education;Patient/family education;Vestibular;Visual/perceptual remediation/compensation;Passive range of motion;Moist Heat;Cryotherapy;Traction;Canalith Repostioning;Joint Manipulations;Manual lymph drainage;Manual techniques;Scar mobilization;Energy conservation;Dry needling;ADLs/Self Care Home  Management;Biofeedback;Electrical Stimulation;Taping    PLAN FOR NEXT SESSION: See clinical impression for plan     GOALS: Goals reviewed with patient? Yes  SHORT TERM GOALS: Target date: 03/09/2024    Pt will demo IND with HEP                    Baseline: Not IND            Goal status: INITIAL   LONG TERM GOALS: Target date:  04/20/2024     1.Pt will demo proper deep core coordination without chest breathing and optimal excursion of diaphragm/pelvic floor in order to promote spinal stability and pelvic floor function  Baseline: dyscoordination Goal status: INITIAL  2.  Pt will demo proper body mechanics in against gravity tasks and ADLs  work tasks, fitness  to minimize straining pelvic floor / back    Baseline: not IND, improper form that places strain on pelvic floor  Goal status: INITIAL    3. Pt will demo increased gait speed > 1.5 m/s with reciprocal gait pattern, longer stride length  in order to ambulate safely in community and return to fitness routine  Baseline: 1.34 m/s , decreased R stance  Goal status: INITIAL    4. Pt will demo levelled pelvic girdle and shoulder height and btieral sideflexion  in order to progress to deep core strengthening HEP and restore mobility at spine, pelvis, gait, posture minimize falls, and improve balance  Baseline: L shoulder and L iliac crest lowered  ,  sideflexion L 43cm, R 47 cm digit III to floor  Goal status: INITIAL   5. Pt will improve PFDI-7 questionnaire to  pts  score change  to demo improved QOL  Baseline:    ( greater pts indicate greater negative impact on QOL)   38  pts  ( total) 10  pts  ( UIQ-7 ) 14   pts  ( CRAIQ-7 ) 14   pts  ( POPIQ-7 )  Baseline:  Goal status: INITIAL  6. Ot will report no more straining with bowel movements and Type 4  across 100% of time  Baseline: Daily bowel movements , straining 30% of the time, Stool Type 4 across 75% of the time  GOAL: INITIAL    7. Pt will be IND with  using pessary and report comfort with physical activities across 2 months  Baseline: just started wearing Sept 2025  GOAL: Initial     Pia Lupe Plump, PT 03/13/2024, 11:44 AM

## 2024-03-13 NOTE — Patient Instructions (Signed)
Deep core level 1-2   2 x day

## 2024-03-14 ENCOUNTER — Encounter: Admitting: Physical Therapy

## 2024-03-15 ENCOUNTER — Encounter: Admitting: Physical Therapy

## 2024-03-20 ENCOUNTER — Ambulatory Visit: Attending: Obstetrics | Admitting: Physical Therapy

## 2024-03-20 DIAGNOSIS — M533 Sacrococcygeal disorders, not elsewhere classified: Secondary | ICD-10-CM | POA: Insufficient documentation

## 2024-03-20 DIAGNOSIS — R293 Abnormal posture: Secondary | ICD-10-CM | POA: Insufficient documentation

## 2024-03-20 DIAGNOSIS — R2689 Other abnormalities of gait and mobility: Secondary | ICD-10-CM | POA: Insufficient documentation

## 2024-03-20 NOTE — Therapy (Signed)
 OUTPATIENT PHYSICAL THERAPY TREATMENT    Patient Name: Erica Valencia MRN: [id number] DOB:05/18/1951, 73 y.o., female Today's Date: 03/20/2024   PT End of Session - 03/20/24 1147     Visit Number 6    Number of Visits 10    Date for Recertification  04/20/24    PT Start Time 1105    PT Stop Time 1150    PT Time Calculation (min) 45 min    Activity Tolerance Patient tolerated treatment well;No increased pain    Behavior During Therapy [facility] for tasks assessed/performed          Past Medical History:  Diagnosis Date   Allergy    Bronchitis    Chest pain of uncertain etiology 09/18/2018   Coronary artery calcification seen on CT scan    a. 05/2021 Cardiac CT: Ca2+ = 108 (69th %'ile).   COVID-19 09/30/2020   Diastolic dysfunction    a. 11/2015 Echo: EF 60-65%, no rwma, GrI DD, nl RV fxn, trive MR/TR, RVSP .   Hemorrhoids    Hyperlipidemia    Hypertension    Scarlet fever    a. as child caused vision issues has to wear glasses now.   Sleep apnea    Syncope    a. 09/2020 Zio: Predominantly sinus rhythm @ 64 (41-203). 21 SVT runs, fastest 203 x 15 beats, longests 19 beats @ 99. Triggered events not assoc w/ signif arrhythmia.   Past Surgical History:  Procedure Laterality Date   APPENDECTOMY     age [age]   CARPAL TUNNEL RELEASE     left 2008   COLONOSCOPY WITH PROPOFOL  N/A 09/23/2021   Procedure: COLONOSCOPY WITH PROPOFOL ;  Surgeon: Maryruth Ole DASEN, MD;  Location: [facility] ENDOSCOPY;  Service: Endoscopy;  Laterality: N/A;   OTHER SURGICAL HISTORY     left great toe fusion 2012    Patient Active Problem List   Diagnosis Date Noted   Cystocele with first degree uterine prolapse 01/26/2024   Vaginal atrophy 09/13/2023   Pelvic pressure in female 09/13/2023   Female genital prolapse 06/29/2023   Vitamin B 12 deficiency 06/29/2023   Vitamin D  deficiency 06/29/2023   Hemorrhoids 12/02/2021   Statin intolerance    Prediabetes 11/15/2020   Annual physical exam  04/17/2020   Osteoporosis 01/25/2019   OSA (obstructive sleep apnea) 12/09/2018   Hyperlipidemia 12/16/2017   Essential hypertension 05/27/2017    PCP: [facility]   REFERRING PROVIDER: Leigh   REFERRING DIAG:  N95.2 (ICD-10-CM) - Vaginal atrophy R10.2 (ICD-10-CM) - Pelvic pressure in female  Rationale for Evaluation and Treatment Rehabilitation  THERAPY DIAG:  Sacrococcygeal disorders, not elsewhere classified  Abnormal posture  Other abnormalities of gait and mobility  ONSET DATE:   SUBJECTIVE:                 SUBJECTIVE STATEMENT  TODAY:                                                                             Pt reports since she started PT, she has noticed easier bowel movement without having to strain and she is having daily movements instead of every 3 days. Pt is more intentional about her diet and making sure  she is taking in more fiber    SUBJECTIVE STATEMENT ON EVAL 02/10/24 : Prolapse : Pt noticed vaginal prolapse showing itself at the vaginal opening four months ago . Pt was moving her couch and felt the bulging after. Pt has been fitted for pessary and will be returning the 9/30 /25 to make sure it is in proper place. Pt reported she has since returned to exercises and walking with the help of the pessary.  Excercises:  walking 2 miles daily 5 days per week with stretching routine.   Routine she discovered online : seated twists, deep core squat,  No crunches ,  no sit ups   Daily bowel movements , straining 30% of the time,   PERTINENT HISTORY:    PAIN:  Are you having pain? No  PRECAUTIONS: None  WEIGHT BEARING RESTRICTIONS: No   FALLS:  Has patient fallen in last 6 months? 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   PLOF: IND   PATIENT GOALS:   Strengthen herself     OBJECTIVE:     [facility] PT Assessment - 03/20/24 1222       Palpation   SI assessment   levelled pelvis but L shoudler still lowered with posterior rotation    Palpation comment hypomobile T7-12 convex L, tightness along interspinals R,  intercostals T10-12 L  anerior/ lateral          [facility] Adult PT Treatment/Exercise - 03/20/24 1232       Self-Care   Other Self-Care Comments  explained role of multidifis mm to help with convex curve at T/L junction      Neuro Re-ed    Neuro Re-ed Details  cued for multifidis in seated position ,with propioception cues for diassociation of trunk from plevis/LKC      Manual Therapy   Manual therapy comments STM/MWM at T/L junction to realign spine and optimize diaphragmatic excursion             HOME EXERCISE PROGRAM: See pt instruction section    ASSESSMENT:  CLINICAL IMPRESSION:  Improvements:        Pt reports since she started PT, she has noticed easier bowel movement without having to strain and she is having daily movements instead of every 3 days. Pt is more intentional about her diet and making sure she is taking in more fiber   Pt shows levelled pelvic girdle and more upright posture but L convex curve at thoracic/lumbar area remains present. Focused today with manual Tx to minimize this and optimize diaphragm excursion. Pt demo'd improved mobility and alignment. Progressed to seated multifidis strengthening with propicoeption cues for coordination and technique. Pt demo'd correctly post training.   Pt reported she felt she could breathe better  and felt taller post training.  Plan to reassess deep core coordination and add neck ROM to minimize thoracic kyphosis and maintain proper alignment of spine.   Anticipate these improvements for a more stacked posture  will  help promote optimize IAP system for improved pelvic floor function, trunk stability, gait, balance, stabilization with mobility tasks.  Plan to address pelvic floor issues once pelvis and spine are realigned to yield better outcomes.                   Regional interdependent approaches will yield greater benefits in pt's POC  Pt benefits from skilled PT.    OBJECTIVE IMPAIRMENTS decreased activity tolerance, decreased coordination, decreased endurance, decreased mobility, difficulty walking, decreased ROM, decreased strength, decreased safety awareness, hypomobility, increased muscle spasms, impaired flexibility, improper body mechanics, postural dysfunction, and  scar restrictions   ACTIVITY LIMITATIONS  self-care,  sleep, home chores, ADLs  tasks    PARTICIPATION LIMITATIONS:  community, social activities    PERSONAL FACTORS   affecting patient's functional outcome:    REHAB POTENTIAL: Good   CLINICAL DECISION MAKING: Evolving/moderate complexity   EVALUATION COMPLEXITY: Moderate    PATIENT EDUCATION:    Education details: Showed pt anatomy images. Explained muscles attachments/ connection, physiology of deep core system/ spinal- thoracic-pelvis-lower kinetic chain as they relate to pt's presentation, Sx, and past Hx. Explained what and how these areas of deficits need to be restored to balance and function    See Therapeutic activity / neuromuscular re-education section  Answered pt's questions.   Person educated: Patient Education method: Explanation, Demonstration, Tactile cues, Verbal cues, and Handouts Education comprehension: verbalized understanding, returned demonstration, verbal cues required, tactile cues required, and needs further education     PLAN: PT FREQUENCY: 1x/week   PT DURATION: 10 weeks   PLANNED INTERVENTIONS:   Gait training;Stair training;Functional mobility training;DME Instruction;Therapeutic activities;Therapeutic exercise;Balance training;Neuromuscular re-education;Patient/family education;Vestibular;Visual/perceptual remediation/compensation;Passive range of motion;Moist Heat;Cryotherapy;Traction;Canalith Repostioning;Joint  Manipulations;Manual lymph drainage;Manual techniques;Scar mobilization;Energy conservation;Dry needling;ADLs/Self Care Home Management;Biofeedback;Electrical Stimulation;Taping    PLAN FOR NEXT SESSION: See clinical impression for plan     GOALS: Goals reviewed with patient? Yes  SHORT TERM GOALS: Target date: 03/09/2024    Pt will demo IND with HEP                    Baseline: Not IND            Goal status: INITIAL   LONG TERM GOALS: Target date:  04/20/2024     1.Pt will demo proper deep core coordination without chest breathing and optimal excursion of diaphragm/pelvic floor in order to promote spinal stability and pelvic floor function  Baseline: dyscoordination Goal status: INITIAL  2.  Pt will demo proper body mechanics in against gravity tasks and ADLs  work tasks, fitness  to minimize straining pelvic floor / back    Baseline: not IND, improper form that places strain on pelvic floor  Goal status: INITIAL    3. Pt will demo increased gait speed > 1.5 m/s with reciprocal gait pattern, longer stride length  in order to ambulate safely in community and return to fitness routine  Baseline: 1.34 m/s , decreased R stance  Goal status: INITIAL    4. Pt will demo levelled pelvic girdle and shoulder height and btieral sideflexion  in order to progress to deep core strengthening HEP and restore mobility at spine, pelvis, gait, posture minimize falls, and improve balance  Baseline: L shoulder and L iliac crest lowered  ,  sideflexion L 43cm, R 47 cm digit III to floor  Goal status: INITIAL   5. Pt will improve PFDI-7 questionnaire to  pts  score change  to demo improved QOL  Baseline:    ( greater pts indicate greater negative impact on QOL)   38  pts  ( total) 10  pts  ( UIQ-7 ) 14   pts  ( CRAIQ-7 ) 14   pts  ( POPIQ-7 )  Baseline:  Goal status: INITIAL  6. Ot will report no more straining with bowel movements and Type 4 across  100% of time  Baseline: Daily  bowel movements , straining 30% of the time, Stool Type 4 across 75% of the time  GOAL: INITIAL    7. Pt will be IND with using pessary and report comfort with physical activities across 2 months  Baseline: just started wearing Sept 2025  GOAL: Initial     Pia Lupe Plump, PT 03/20/2024, 11:47 AM

## 2024-03-20 NOTE — Patient Instructions (Signed)
Multifidis twist   Band is on doorknob: sit facing perpendicular to door , sit halfway towards front of chair, firm through 4 points of contact at buttocks and feet. Feet are placed hip with apart.   Twisting trunk without moving the hips and knees Hold band at the level of ribcage, elbows bent,shoulder blades roll back and down like squeezing a pencil under armpit   Exhale twist,.10-15 deg away from door without moving your hips/ knees, press more weight on the side of the sitting bones/ foot opp of your direction of turn as your counterweight. Continue to maintain equal weight through legs.  Keep knee unlocked.  30 reps   

## 2024-03-21 ENCOUNTER — Encounter: Admitting: Physical Therapy

## 2024-03-22 ENCOUNTER — Encounter: Admitting: Physical Therapy

## 2024-03-28 ENCOUNTER — Encounter: Admitting: Physical Therapy

## 2024-03-29 ENCOUNTER — Encounter: Admitting: Physical Therapy

## 2024-03-29 ENCOUNTER — Ambulatory Visit: Admitting: Physical Therapy

## 2024-03-29 DIAGNOSIS — R2689 Other abnormalities of gait and mobility: Secondary | ICD-10-CM | POA: Diagnosis not present

## 2024-03-29 DIAGNOSIS — R293 Abnormal posture: Secondary | ICD-10-CM | POA: Diagnosis not present

## 2024-03-29 DIAGNOSIS — M533 Sacrococcygeal disorders, not elsewhere classified: Secondary | ICD-10-CM | POA: Diagnosis not present

## 2024-03-29 NOTE — Therapy (Signed)
 OUTPATIENT PHYSICAL THERAPY TREATMENT    Patient Name: Erica Valencia MRN: [id number] DOB:[birth year], 73 y.o., female Today's Date: 03/29/2024   PT End of Session - 03/29/24 1110     Visit Number 7    Number of Visits 10    Date for Recertification  04/20/24    PT Start Time 1106    PT Stop Time 1145    PT Time Calculation (min) 39 min    Activity Tolerance Patient tolerated treatment well;No increased pain    Behavior During Therapy [facility] for tasks assessed/performed          Past Medical History:  Diagnosis Date   Allergy    Bronchitis    Chest pain of uncertain etiology 09/18/2018   Coronary artery calcification seen on CT scan    a. 05/2021 Cardiac CT: Ca2+ = 108 (69th %'ile).   COVID-19 09/30/2020   Diastolic dysfunction    a. 11/2015 Echo: EF 60-65%, no rwma, GrI DD, nl RV fxn, trive MR/TR, RVSP .   Hemorrhoids    Hyperlipidemia    Hypertension    Scarlet fever    a. as child caused vision issues has to wear glasses now.   Sleep apnea    Syncope    a. 09/2020 Zio: Predominantly sinus rhythm @ 64 (41-203). 21 SVT runs, fastest 203 x 15 beats, longests 19 beats @ 99. Triggered events not assoc w/ signif arrhythmia.   Past Surgical History:  Procedure Laterality Date   APPENDECTOMY     age [age]   CARPAL TUNNEL RELEASE     left 2008   COLONOSCOPY WITH PROPOFOL  N/A 09/23/2021   Procedure: COLONOSCOPY WITH PROPOFOL ;  Surgeon: Maryruth Ole DASEN, MD;  Location: [facility] ENDOSCOPY;  Service: Endoscopy;  Laterality: N/A;   OTHER SURGICAL HISTORY     left great toe fusion 2012    Patient Active Problem List   Diagnosis Date Noted   Cystocele with first degree uterine prolapse 01/26/2024   Vaginal atrophy 09/13/2023   Pelvic pressure in female 09/13/2023   Female genital prolapse 06/29/2023   Vitamin B 12 deficiency 06/29/2023   Vitamin D  deficiency 06/29/2023   Hemorrhoids 12/02/2021   Statin intolerance    Prediabetes 11/15/2020   Annual physical exam  04/17/2020   Osteoporosis 01/25/2019   OSA (obstructive sleep apnea) 12/09/2018   Hyperlipidemia 12/16/2017   Essential hypertension 05/27/2017    PCP: [facility]   REFERRING PROVIDER: Leigh   REFERRING DIAG:  N95.2 (ICD-10-CM) - Vaginal atrophy R10.2 (ICD-10-CM) - Pelvic pressure in female  Rationale for Evaluation and Treatment Rehabilitation  THERAPY DIAG:  Sacrococcygeal disorders, not elsewhere classified  Abnormal posture  Other abnormalities of gait and mobility  ONSET DATE:   SUBJECTIVE:                 SUBJECTIVE STATEMENT  TODAY:                                                                             Pt report she feels good. She does her exercises twice a day . Pt will be making a appt with OB/GYN to remove pessary to see how it affected prolapse   SUBJECTIVE STATEMENT ON EVAL 02/10/24 :  Prolapse : Pt noticed vaginal prolapse showing itself at the vaginal opening four months ago . Pt was moving her couch and felt the bulging after. Pt has been fitted for pessary and will be returning the 9/30 /25 to make sure it is in proper place. Pt reported she has since returned to exercises and walking with the help of the pessary.  Excercises:  walking 2 miles daily 5 days per week with stretching routine.   Routine she discovered online : seated twists, deep core squat,  No crunches ,  no sit ups   Daily bowel movements , straining 30% of the time,   PERTINENT HISTORY:    PAIN:  Are you having pain? No  PRECAUTIONS: None  WEIGHT BEARING RESTRICTIONS: No   FALLS:  Has patient fallen in last 6 months? 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   PLOF: IND   PATIENT GOALS:   Strengthen herself     OBJECTIVE:     [facility] PT Assessment - 03/29/24 1124       Palpation   SI assessment  levelled pelvis but L shoudler still lowered with posterior rotation    Palpation  comment hypomobility at T10-L1, intercostals, T2 L      Ambulation/Gait   Gait Comments 1.43 m/s, reciprocal gait          [facility] Adult PT Treatment/Exercise - 03/29/24 1124       Neuro Re-ed    Neuro Re-ed Details  propioceptive cued for One arm push up at counter to lengthen L flank      Manual Therapy   Manual therapy comments STM/MWM at T/L junction to realign spine              HOME EXERCISE PROGRAM: See pt instruction section    ASSESSMENT:  CLINICAL IMPRESSION:  Improvements:        Pt reports since she started PT, she has noticed easier bowel movement without having to strain and she is having daily movements instead of every 3 days. Pt is more intentional about her diet and making sure she is taking in more fiber   Pt shows levelled pelvic girdle and more upright posture but L convex curve at thoracic/lumbar area remains present. Focused today with manual Tx to minimize this and optimize diaphragm excursion. Pt demo'd improved mobility and alignment. Propioceptive cues for one arm push up at counter to lengthen L flank . Pt performed correctly post Tx  Plan to reassess deep core coordination and add neck ROM to minimize thoracic kyphosis and maintain proper alignment of spine.   Anticipate these improvements for a more stacked posture  will  help promote optimize IAP system for improved pelvic floor function, trunk stability, gait, balance, stabilization with mobility tasks.  Plan to address pelvic floor issues once pelvis and spine are realigned to yield better outcomes.                  Regional interdependent approaches will yield greater benefits in pt's POC                                                     Pt benefits from skilled PT.    OBJECTIVE IMPAIRMENTS decreased  activity tolerance, decreased coordination, decreased endurance, decreased mobility, difficulty walking, decreased ROM, decreased strength, decreased safety awareness, hypomobility,  increased muscle spasms, impaired flexibility, improper body mechanics, postural dysfunction, and  scar restrictions   ACTIVITY LIMITATIONS  self-care,  sleep, home chores, ADLs  tasks    PARTICIPATION LIMITATIONS:  community, social activities    PERSONAL FACTORS   affecting patient's functional outcome:    REHAB POTENTIAL: Good   CLINICAL DECISION MAKING: Evolving/moderate complexity   EVALUATION COMPLEXITY: Moderate    PATIENT EDUCATION:    Education details: Showed pt anatomy images. Explained muscles attachments/ connection, physiology of deep core system/ spinal- thoracic-pelvis-lower kinetic chain as they relate to pt's presentation, Sx, and past Hx. Explained what and how these areas of deficits need to be restored to balance and function    See Therapeutic activity / neuromuscular re-education section  Answered pt's questions.   Person educated: Patient Education method: Explanation, Demonstration, Tactile cues, Verbal cues, and Handouts Education comprehension: verbalized understanding, returned demonstration, verbal cues required, tactile cues required, and needs further education     PLAN: PT FREQUENCY: 1x/week   PT DURATION: 10 weeks   PLANNED INTERVENTIONS:   Gait training;Stair training;Functional mobility training;DME Instruction;Therapeutic activities;Therapeutic exercise;Balance training;Neuromuscular re-education;Patient/family education;Vestibular;Visual/perceptual remediation/compensation;Passive range of motion;Moist Heat;Cryotherapy;Traction;Canalith Repostioning;Joint Manipulations;Manual lymph drainage;Manual techniques;Scar mobilization;Energy conservation;Dry needling;ADLs/Self Care Home Management;Biofeedback;Electrical Stimulation;Taping    PLAN FOR NEXT SESSION: See clinical impression for plan     GOALS: Goals reviewed with patient? Yes  SHORT TERM GOALS: Target date: 03/09/2024    Pt will demo IND with HEP                    Baseline:  Not IND            Goal status: INITIAL   LONG TERM GOALS: Target date:  04/20/2024     1.Pt will demo proper deep core coordination without chest breathing and optimal excursion of diaphragm/pelvic floor in order to promote spinal stability and pelvic floor function  Baseline: dyscoordination Goal status: MET   2.  Pt will demo proper body mechanics in against gravity tasks and ADLs  work tasks, fitness  to minimize straining pelvic floor / back    Baseline: not IND, improper form that places strain on pelvic floor  Goal status: MET     3. Pt will demo increased gait speed > 1.5 m/s with reciprocal gait pattern, longer stride length  in order to ambulate safely in community and return to fitness routine  Baseline: 1.34 m/s , decreased R stance  Goal status: MET ( 03/29/24:  1.43 m/s reciprocal gait )     4. Pt will demo levelled pelvic girdle and shoulder height and btieral sideflexion  in order to progress to deep core strengthening HEP and restore mobility at spine, pelvis, gait, posture minimize falls, and improve balance  Baseline: L shoulder and L iliac crest lowered  ,  sideflexion L 43cm, R 47 cm digit III to floor  Goal status:  partially met    5. Pt will improve PFDI-7 questionnaire to  pts  score change  to demo improved QOL  Baseline:    ( greater pts indicate greater negative impact on QOL)   38  pts  ( total)   -->  0 pts  10  pts  ( UIQ-7 )  --> 0 pt 14   pts  ( CRAIQ-7 )  --> 0 pts 14   pts  ( POPIQ-7 )   -->  0 pts   Goal status: MET   6. Pt will report no more straining with bowel movements and Type 4 across 100% of time  Baseline: Daily bowel movements , straining 30% of the time, Stool Type 4 across 75% of the time  GOAL: Partially  MET ( 03/29/24:   no more straining with BMs,  Type 4 across 80% of the time )   7. Pt will be IND with using pessary and report comfort with physical activities across 2 months  Baseline: just started wearing Sept 2025   GOAL: MET      Pia Lupe Plump, PT 03/29/2024, 11:11 AM

## 2024-03-29 NOTE — Patient Instructions (Signed)
 L one arm push up from counter  Standing perpendicular 10 x reps   X 3

## 2024-03-30 ENCOUNTER — Ambulatory Visit

## 2024-04-03 ENCOUNTER — Ambulatory Visit: Admitting: Physical Therapy

## 2024-04-20 ENCOUNTER — Ambulatory Visit: Admitting: Physical Therapy

## 2024-04-20 DIAGNOSIS — R2689 Other abnormalities of gait and mobility: Secondary | ICD-10-CM | POA: Diagnosis present

## 2024-04-20 DIAGNOSIS — M533 Sacrococcygeal disorders, not elsewhere classified: Secondary | ICD-10-CM | POA: Insufficient documentation

## 2024-04-20 DIAGNOSIS — R293 Abnormal posture: Secondary | ICD-10-CM | POA: Insufficient documentation

## 2024-04-20 NOTE — Patient Instructions (Signed)
 Wall lean for scoliosis   L Forearm slide against wall as you stand perpendicular to wall,feet hip width apart,  opposite hand pulls band without letting elbow move away from side body  And slide forearm against the door up and down  Tilt trunk away from door but not away from hip   Make sure the upper trapezius muscle does not hike up to ear as band is being pulled as you lean towards wall    band  30  x 2 different day    Leaning R  only to open the L  flank area

## 2024-04-20 NOTE — Therapy (Signed)
 OUTPATIENT PHYSICAL THERAPY TREATMENT  kerman  / recert  Patient Name: Erica Valencia MRN: [id number] DOB:1951-04-17, 73 y.o., female Today's Date: 04/20/2024   PT End of Session - 04/20/24 1111     Visit Number 8    Number of Visits 10    Date for Recertification  04/20/24    PT Start Time 1108    PT Stop Time 1150    PT Time Calculation (min) 42 min    Activity Tolerance Patient tolerated treatment well;No increased pain    Behavior During Therapy [facility] for tasks assessed/performed          Past Medical History:  Diagnosis Date   Allergy    Bronchitis    Chest pain of uncertain etiology 09/18/2018   Coronary artery calcification seen on CT scan    a. 05/2021 Cardiac CT: Ca2+ = 108 (69th %'ile).   COVID-19 09/30/2020   Diastolic dysfunction    a. 11/2015 Echo: EF 60-65%, no rwma, GrI DD, nl RV fxn, trive MR/TR, RVSP .   Hemorrhoids    Hyperlipidemia    Hypertension    Scarlet fever    a. as child caused vision issues has to wear glasses now.   Sleep apnea    Syncope    a. 09/2020 Zio: Predominantly sinus rhythm @ 64 (41-203). 21 SVT runs, fastest 203 x 15 beats, longests 19 beats @ 99. Triggered events not assoc w/ signif arrhythmia.   Past Surgical History:  Procedure Laterality Date   APPENDECTOMY     age [age]   CARPAL TUNNEL RELEASE     left 2008   COLONOSCOPY WITH PROPOFOL  N/A 09/23/2021   Procedure: COLONOSCOPY WITH PROPOFOL ;  Surgeon: Maryruth Ole DASEN, MD;  Location: [facility];  Service: Endoscopy;  Laterality: N/A;   OTHER SURGICAL HISTORY     left great toe fusion 2012    Patient Active Problem List   Diagnosis Date Noted   Cystocele with first degree uterine prolapse 01/26/2024   Vaginal atrophy 09/13/2023   Pelvic pressure in female 09/13/2023   Female genital prolapse 06/29/2023   Vitamin B 12 deficiency 06/29/2023   Vitamin D  deficiency 06/29/2023   Hemorrhoids 12/02/2021   Statin intolerance    Prediabetes 11/15/2020   Annual  physical exam 04/17/2020   Osteoporosis 01/25/2019   OSA (obstructive sleep apnea) 12/09/2018   Hyperlipidemia 12/16/2017   Essential hypertension 05/27/2017    PCP: [facility]   REFERRING PROVIDER: Leigh   REFERRING DIAG:  N95.2 (ICD-10-CM) - Vaginal atrophy R10.2 (ICD-10-CM) - Pelvic pressure in female  Rationale for Evaluation and Treatment Rehabilitation  THERAPY DIAG:  Sacrococcygeal disorders, not elsewhere classified  Abnormal posture  Other abnormalities of gait and mobility  ONSET DATE:   SUBJECTIVE:                 SUBJECTIVE STATEMENT  TODAY:                                                                             Pt reports her L upper back was tight and she used tennis ball to roll against wall             Pt will be getting her  pessary removed in the middle of January . Pt reports she has not noticed it was there. Pt will try to be without it for 3 months after removal   SUBJECTIVE STATEMENT ON EVAL 02/10/24 : Prolapse : Pt noticed vaginal prolapse showing itself at the vaginal opening four months ago . Pt was moving her couch and felt the bulging after. Pt has been fitted for pessary and will be returning the 9/30 /25 to make sure it is in proper place. Pt reported she has since returned to exercises and walking with the help of the pessary.  Excercises:  walking 2 miles daily 5 days per week with stretching routine.   Routine she discovered online : seated twists, deep core squat,  No crunches ,  no sit ups   Daily bowel movements , straining 30% of the time,   PERTINENT HISTORY:    PAIN:  Are you having pain? No  PRECAUTIONS: None  WEIGHT BEARING RESTRICTIONS: No   FALLS:  Has patient fallen in last 6 months? 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  [facility] Adult PT Treatment/Exercise - 04/20/24 1145       Neuro Re-ed    Neuro Re-ed Details  propioceptive cued for resistance band HEPto lengthen L flank and to coorect scolisis      Manual Therapy   Manual therapy comments STM/MWM at T/L junction to realign spine in prone and  sidelying to promote anteriorlateral excursionof T10-11 L , minimize paraspinal mm tightness                HOME EXERCISE PROGRAM: See pt instruction section    ASSESSMENT:  CLINICAL IMPRESSION: Pt has met 5/7 goals and partially met remaining 2 goals. Anticipate pt will be able to achieve remaining goals.  Improvements:        Pt reports since she started PT, she has noticed easier bowel movement without having to strain and she is having daily movements instead of every 3 days. Pt is more intentional about her diet and making sure she is taking in more fiber    no more straining with BMs,  Type 4 across 80% of the time instead of 75% of the time   Pt shows levelled pelvic girdle but L shoulder still lowered due to T 12 segment deviated to the L2  Manual Tx was applied to further minimize scoliotic curve. Added new HEP to address this with CKC and resistance band applied to the L side only.  Pt demo'd correctly after proprioceptive cues for new HEP and reported a relief in between the two lower ribs    Anticipate these improvements for a more stacked posture  and optimize diaphragmatic excursion on L low ribs which will  help promote optimize IAP system for improved pelvic floor function, trunk stability, gait, balance, stabilization with mobility tasks.    Plan to address pelvic floor issues once pelvis and spine  are realigned to yield better outcomes. Pt has been wearing  a pessary which  will be removed in mid January.                Regional interdependent approaches will yield greater benefits in pt's POC                                                     Pt benefits from skilled PT.    OBJECTIVE IMPAIRMENTS decreased activity tolerance, decreased coordination, decreased endurance, decreased mobility, difficulty walking, decreased ROM, decreased strength, decreased safety awareness, hypomobility, increased muscle spasms, impaired flexibility, improper body mechanics, postural dysfunction, and  scar restrictions   ACTIVITY LIMITATIONS  self-care,  sleep, home chores, ADLs  tasks    PARTICIPATION LIMITATIONS:  community, social activities    PERSONAL FACTORS   affecting patient's functional outcome:    REHAB POTENTIAL: Good   CLINICAL DECISION MAKING: Evolving/moderate complexity   EVALUATION COMPLEXITY: Moderate    PATIENT EDUCATION:    Education details: Showed pt anatomy images. Explained muscles attachments/ connection, physiology of deep core system/ spinal- thoracic-pelvis-lower kinetic chain as they relate to pt's presentation, Sx, and past Hx. Explained what and how these areas of deficits need to be restored to balance and function    See Therapeutic activity / neuromuscular re-education section  Answered pt's questions.   Person educated: Patient Education method: Explanation, Demonstration, Tactile cues, Verbal cues, and Handouts Education comprehension: verbalized understanding, returned demonstration, verbal cues required, tactile cues required, and needs further education     PLAN: PT FREQUENCY: 1x/week   PT DURATION: 10 weeks   PLANNED INTERVENTIONS:   Gait training;Stair training;Functional mobility training;DME Instruction;Therapeutic activities;Therapeutic exercise;Balance training;Neuromuscular re-education;Patient/family education;Vestibular;Visual/perceptual remediation/compensation;Passive range of motion;Moist  Heat;Cryotherapy;Traction;Canalith Repostioning;Joint Manipulations;Manual lymph drainage;Manual techniques;Scar mobilization;Energy conservation;Dry needling;ADLs/Self Care Home Management;Biofeedback;Electrical Stimulation;Taping    PLAN FOR NEXT SESSION: See clinical impression for plan     GOALS: Goals reviewed with patient? Yes  SHORT TERM GOALS: Target date: 03/09/2024    Pt will demo IND with HEP                    Baseline: Not IND            Goal status: INITIAL   LONG TERM GOALS: Target date: 06/29/2024     1.Pt will demo proper deep core coordination without chest breathing and optimal excursion of diaphragm/pelvic floor in order to promote spinal stability and pelvic floor function  Baseline: dyscoordination Goal status: MET   2.  Pt will demo proper body mechanics in against gravity tasks and ADLs  work tasks, fitness  to minimize straining pelvic floor / back    Baseline: not IND, improper form that places strain on pelvic floor  Goal status: MET     3. Pt will demo increased gait speed > 1.5 m/s with reciprocal gait pattern, longer stride length  in order to ambulate safely in community and return to fitness routine  Baseline: 1.34 m/s , decreased R stance  Goal status: MET ( 03/29/24:  1.43 m/s reciprocal gait )     4. Pt will demo levelled pelvic girdle and shoulder height and btieral sideflexion  in order to progress to deep core strengthening HEP and restore mobility at spine, pelvis, gait, posture minimize falls, and improve balance  Baseline: L  shoulder and L iliac crest lowered  ,  sideflexion L 43cm, R 47 cm digit III to floor  Goal status:  partially met    5. Pt will improve PFDI-7 questionnaire to  pts  score change  to demo improved QOL  Baseline:    ( greater pts indicate greater negative impact on QOL)   38  pts  ( total)   -->  0 pts  10  pts  ( UIQ-7 )  --> 0 pt 14   pts  ( CRAIQ-7 )  --> 0 pts 14   pts  ( POPIQ-7 )   --> 0 pts   Goal  status: MET   6. Pt will report no more straining with bowel movements and Type 4 across 100% of time  Baseline: Daily bowel movements , straining 30% of the time, Stool Type 4 across 75% of the time  GOAL: Partially  MET ( 03/29/24:   no more straining with BMs,  Type 4 across 80% of the time )   7. Pt will be IND with using pessary and report comfort with physical activities across 2 months  Baseline: just started wearing Sept 2025  GOAL: MET      Pia Lupe Plump, PT 04/20/2024, 11:12 AM

## 2024-04-27 ENCOUNTER — Ambulatory Visit: Admitting: Physical Therapy

## 2024-04-27 DIAGNOSIS — M533 Sacrococcygeal disorders, not elsewhere classified: Secondary | ICD-10-CM | POA: Diagnosis not present

## 2024-04-27 DIAGNOSIS — R293 Abnormal posture: Secondary | ICD-10-CM

## 2024-04-27 DIAGNOSIS — R2689 Other abnormalities of gait and mobility: Secondary | ICD-10-CM

## 2024-04-27 NOTE — Therapy (Unsigned)
 OUTPATIENT PHYSICAL THERAPY TREATMENT  / recert  Patient Name: Erica Valencia MRN: [id number] DOB:1950/05/31, 73 y.o., female Today's Date: 04/27/2024   PT End of Session - 04/27/24 1115     Visit Number 9    Number of Visits 18    Date for Recertification  08/08/26    PT Start Time 1109    PT Stop Time 1150    PT Time Calculation (min) 41 min    Activity Tolerance Patient tolerated treatment well;No increased pain    Behavior During Therapy [facility] for tasks assessed/performed          Past Medical History:  Diagnosis Date   Allergy    Bronchitis    Chest pain of uncertain etiology 09/18/2018   Coronary artery calcification seen on CT scan    a. 05/2021 Cardiac CT: Ca2+ = 108 (69th %'ile).   COVID-19 09/30/2020   Diastolic dysfunction    a. 11/2015 Echo: EF 60-65%, no rwma, GrI DD, nl RV fxn, trive MR/TR, RVSP .   Hemorrhoids    Hyperlipidemia    Hypertension    Scarlet fever    a. as child caused vision issues has to wear glasses now.   Sleep apnea    Syncope    a. 09/2020 Zio: Predominantly sinus rhythm @ 64 (41-203). 21 SVT runs, fastest 203 x 15 beats, longests 19 beats @ 99. Triggered events not assoc w/ signif arrhythmia.   Past Surgical History:  Procedure Laterality Date   APPENDECTOMY     age [age]   CARPAL TUNNEL RELEASE     left 2008   COLONOSCOPY WITH PROPOFOL  N/A 09/23/2021   Procedure: COLONOSCOPY WITH PROPOFOL ;  Surgeon: Maryruth Ole DASEN, MD;  Location: [facility] ENDOSCOPY;  Service: Endoscopy;  Laterality: N/A;   OTHER SURGICAL HISTORY     left great toe fusion 2012    Patient Active Problem List   Diagnosis Date Noted   Cystocele with first degree uterine prolapse 01/26/2024   Vaginal atrophy 09/13/2023   Pelvic pressure in female 09/13/2023   Female genital prolapse 06/29/2023   Vitamin B 12 deficiency 06/29/2023   Vitamin D  deficiency 06/29/2023   Hemorrhoids 12/02/2021   Statin intolerance    Prediabetes 11/15/2020   Annual physical exam  04/17/2020   Osteoporosis 01/25/2019   OSA (obstructive sleep apnea) 12/09/2018   Hyperlipidemia 12/16/2017   Essential hypertension 05/27/2017    PCP: [facility]   REFERRING PROVIDER: Leigh   REFERRING DIAG:  N95.2 (ICD-10-CM) - Vaginal atrophy R10.2 (ICD-10-CM) - Pelvic pressure in female  Rationale for Evaluation and Treatment Rehabilitation  THERAPY DIAG:  Sacrococcygeal disorders, not elsewhere classified  Abnormal posture  Other abnormalities of gait and mobility  ONSET DATE:   SUBJECTIVE:                 SUBJECTIVE STATEMENT  TODAY:                                                                             Pt reports no issues with new HEP from last week   Pt still has not scheduled appt to remove h er pessary with GYN because she tried and was told to try back after Xmas  as their clinic was undergoing a move to a new location  Pt reports no irritation nor foul odor with pessary. Pt feels uncomfortable to try removing it herself   Pt bought new dress shoes which have wide toe box and are comfortable for her   Pt feels better balance when walking    SUBJECTIVE STATEMENT ON EVAL 02/10/24 : Prolapse : Pt noticed vaginal prolapse showing itself at the vaginal opening four months ago . Pt was moving her couch and felt the bulging after. Pt has been fitted for pessary and will be returning the 9/30 /25 to make sure it is in proper place. Pt reported she has since returned to exercises and walking with the help of the pessary.  Excercises:  walking 2 miles daily 5 days per week with stretching routine.   Routine she discovered online : seated twists, deep core squat,  No crunches ,  no sit ups   Daily bowel movements , straining 30% of the time,   PERTINENT HISTORY:    PAIN:  Are you having pain? No  PRECAUTIONS: None  WEIGHT BEARING RESTRICTIONS: No   FALLS:  Has patient fallen in last 6 months? 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   PLOF: IND   PATIENT GOALS:   Strengthen herself     OBJECTIVE:     [facility] PT Assessment - 04/27/24 1119       Palpation   SI assessment  levelled pelvic girdle but L shoulder still lowered , but paraspinal mm tightness less dominant and posterior rotation of thorax less pronounced    Palpation comment deviated T 12-L2, tightness along paraspinals, medial scapular, pects, teres minor SA L          [facility] Adult PT Treatment/Exercise - 04/27/24 1154       Neuro Re-ed    Neuro Re-ed Details  propioceptive cued segmental movement of thorax and upper spine to optimize diaphragm excursion on L which will help IAP system for prolapse support and level out spine and pelvis      Manual Therapy   Manual therapy comments STM/MWM at T/L junction to realign spine  sidelying to  realignment of T12-L2, decrease  tightness along paraspinals, medial scapular, pects, teres minor SA L            .          HOME EXERCISE PROGRAM: See pt instruction section    ASSESSMENT:  CLINICAL IMPRESSION:   Pt has met 5/7 goals and partially met remaining 2 goals. Anticipate pt will be able to achieve remaining goals.  Improvements:        Pt reports since she started PT, she has noticed easier bowel movement without having to strain and she is having daily movements instead of every 3 days. Pt is more intentional about her diet and making sure she is taking in more fiber    no more straining with BMs,  Type 4 across 80% of the time instead of 75% of the time  Pt feels better balance when walking     TODAY: Pt shows levelled pelvic girdle but L shoulder still lowered.  Manual Tx was applied to T/L junction to realign spine  sidelying to  realignment of T12-L2, decrease  tightness along paraspinals, medial scapular, pects, teres minor SA L  in order to further minimize scoliotic curve. Discontinued  one of last week's HEP  and replaced with isometric exercise with propioceptive cued segmental movement of thorax and upper spine to optimize diaphragm excursion on L which will help IAP system for prolapse support and level out spine and pelvis.  Anticipate multidifis strengthening will continue to lengthen spine and minimize thoracic kyphosis    Anticipate these improvements for a more stacked posture  and optimize diaphragmatic excursion on L low ribs which will  help promote optimize IAP system for improved pelvic floor function, trunk stability, gait, balance, stabilization with mobility tasks.    Plan to address pelvic floor issues once pelvis and spine are realigned to yield better outcomes. Pt has been wearing  a pessary which will be removed in mid January.                Regional interdependent approaches will yield greater benefits in pt's POC                                                     Pt benefits from skilled PT.    OBJECTIVE IMPAIRMENTS decreased activity tolerance, decreased coordination, decreased endurance, decreased mobility, difficulty walking, decreased ROM, decreased strength, decreased safety awareness, hypomobility, increased muscle spasms, impaired flexibility, improper body mechanics, postural dysfunction, and  scar restrictions   ACTIVITY LIMITATIONS  self-care,  sleep, home chores, ADLs  tasks    PARTICIPATION LIMITATIONS:  community, social activities    PERSONAL FACTORS   affecting patient's functional outcome:    REHAB POTENTIAL: Good   CLINICAL DECISION MAKING: Evolving/moderate complexity   EVALUATION COMPLEXITY: Moderate    PATIENT EDUCATION:    Education details: Showed pt anatomy images. Explained muscles attachments/ connection, physiology of deep core system/ spinal- thoracic-pelvis-lower kinetic chain as they relate to pt's presentation, Sx, and past Hx. Explained what and how these areas of deficits need to be restored to balance and function    See Therapeutic  activity / neuromuscular re-education section  Answered pt's questions.   Person educated: Patient Education method: Explanation, Demonstration, Tactile cues, Verbal cues, and Handouts Education comprehension: verbalized understanding, returned demonstration, verbal cues required, tactile cues required, and needs further education     PLAN: PT FREQUENCY: 1x/week   PT DURATION: 10 weeks   PLANNED INTERVENTIONS:   Gait training;Stair training;Functional mobility training;DME Instruction;Therapeutic activities;Therapeutic exercise;Balance training;Neuromuscular re-education;Patient/family education;Vestibular;Visual/perceptual remediation/compensation;Passive range of motion;Moist Heat;Cryotherapy;Traction;Canalith Repostioning;Joint Manipulations;Manual lymph drainage;Manual techniques;Scar mobilization;Energy conservation;Dry needling;ADLs/Self Care Home Management;Biofeedback;Electrical Stimulation;Taping    PLAN FOR NEXT SESSION: See clinical impression for plan     GOALS: Goals reviewed with patient? Yes  SHORT TERM GOALS: Target date: 03/09/2024    Pt will demo IND with HEP                    Baseline: Not IND            Goal status: INITIAL   LONG TERM GOALS: Target date: 06/29/2024     1.Pt will demo proper deep core coordination without chest breathing and optimal excursion of diaphragm/pelvic floor in order to promote spinal stability and pelvic floor function  Baseline: dyscoordination Goal status: MET   2.  Pt will demo proper body mechanics in against gravity tasks and ADLs  work tasks, fitness  to minimize straining pelvic floor / back    Baseline: not IND, improper form  that places strain on pelvic floor  Goal status: MET     3. Pt will demo increased gait speed > 1.5 m/s with reciprocal gait pattern, longer stride length  in order to ambulate safely in community and return to fitness routine  Baseline: 1.34 m/s , decreased R stance  Goal status: MET (  03/29/24:  1.43 m/s reciprocal gait )     4. Pt will demo levelled pelvic girdle and shoulder height and btieral sideflexion  in order to progress to deep core strengthening HEP and restore mobility at spine, pelvis, gait, posture minimize falls, and improve balance  Baseline: L shoulder and L iliac crest lowered  ,  sideflexion L 43cm, R 47 cm digit III to floor  Goal status:  partially met    5. Pt will improve PFDI-7 questionnaire to  pts  score change  to demo improved QOL  Baseline:    ( greater pts indicate greater negative impact on QOL)   38  pts  ( total)   -->  0 pts  10  pts  ( UIQ-7 )  --> 0 pt 14   pts  ( CRAIQ-7 )  --> 0 pts 14   pts  ( POPIQ-7 )   --> 0 pts   Goal status: MET   6. Pt will report no more straining with bowel movements and Type 4 across 100% of time  Baseline: Daily bowel movements , straining 30% of the time, Stool Type 4 across 75% of the time  GOAL: Partially  MET ( 03/29/24:   no more straining with BMs,  Type 4 across 80% of the time )   7. Pt will be IND with using pessary and report comfort with physical activities across 2 months  Baseline: just started wearing Sept 2025  GOAL: MET      Pia Lupe Plump, PT 04/27/2024, 11:16 AM

## 2024-04-27 NOTE — Patient Instructions (Signed)
 Discontinue the tip the tea pot exercise   __  Replace with same position  Press forearm against door and make a half arc Lift of ribs Turning chest, then head looking moving fist , shoulder relax down 15 reps

## 2024-05-04 ENCOUNTER — Ambulatory Visit: Admitting: Physical Therapy

## 2024-05-04 DIAGNOSIS — R293 Abnormal posture: Secondary | ICD-10-CM

## 2024-05-04 DIAGNOSIS — M533 Sacrococcygeal disorders, not elsewhere classified: Secondary | ICD-10-CM | POA: Diagnosis not present

## 2024-05-04 DIAGNOSIS — R2689 Other abnormalities of gait and mobility: Secondary | ICD-10-CM

## 2024-05-04 NOTE — Therapy (Signed)
 OUTPATIENT PHYSICAL THERAPY TREATMENT   / Progress Note across 10 visits across 02/10/24  to 05/04/24  / recert  Patient Name: Erica Valencia MRN: [id number] DOB:Sep 23, 1950, 73 y.o., female Today's Date: 05/04/2024   PT End of Session - 05/04/24 1140     Visit Number 10    Number of Visits 18    Date for Recertification  08/08/26    PT Start Time 1105    PT Stop Time 1150    PT Time Calculation (min) 45 min    Activity Tolerance Patient tolerated treatment well;No increased pain    Behavior During Therapy [facility] for tasks assessed/performed          Past Medical History:  Diagnosis Date   Allergy    Bronchitis    Chest pain of uncertain etiology 09/18/2018   Coronary artery calcification seen on CT scan    a. 05/2021 Cardiac CT: Ca2+ = 108 (69th %'ile).   COVID-19 09/30/2020   Diastolic dysfunction    a. 11/2015 Echo: EF 60-65%, no rwma, GrI DD, nl RV fxn, trive MR/TR, RVSP .   Hemorrhoids    Hyperlipidemia    Hypertension    Scarlet fever    a. as child caused vision issues has to wear glasses now.   Sleep apnea    Syncope    a. 09/2020 Zio: Predominantly sinus rhythm @ 64 (41-203). 21 SVT runs, fastest 203 x 15 beats, longests 19 beats @ 99. Triggered events not assoc w/ signif arrhythmia.   Past Surgical History:  Procedure Laterality Date   APPENDECTOMY     age [age]   CARPAL TUNNEL RELEASE     left 2008   COLONOSCOPY WITH PROPOFOL  N/A 09/23/2021   Procedure: COLONOSCOPY WITH PROPOFOL ;  Surgeon: Maryruth Ole DASEN, MD;  Location: [facility] ENDOSCOPY;  Service: Endoscopy;  Laterality: N/A;   OTHER SURGICAL HISTORY     left great toe fusion 2012    Patient Active Problem List   Diagnosis Date Noted   Cystocele with first degree uterine prolapse 01/26/2024   Vaginal atrophy 09/13/2023   Pelvic pressure in female 09/13/2023   Female genital prolapse 06/29/2023   Vitamin B 12 deficiency 06/29/2023   Vitamin D  deficiency 06/29/2023   Hemorrhoids 12/02/2021   Statin  intolerance    Prediabetes 11/15/2020   Annual physical exam 04/17/2020   Osteoporosis 01/25/2019   OSA (obstructive sleep apnea) 12/09/2018   Hyperlipidemia 12/16/2017   Essential hypertension 05/27/2017    PCP: [facility]   REFERRING PROVIDER: Leigh   REFERRING DIAG:  N95.2 (ICD-10-CM) - Vaginal atrophy R10.2 (ICD-10-CM) - Pelvic pressure in female  Rationale for Evaluation and Treatment Rehabilitation  THERAPY DIAG:  Sacrococcygeal disorders, not elsewhere classified  Abnormal posture  Other abnormalities of gait and mobility  ONSET DATE:   SUBJECTIVE:                 SUBJECTIVE STATEMENT  TODAY:                                                                             Pt reports no issues with new HEP from last week. Pt is able lay flat on her back which is something she has  not been able to do     SUBJECTIVE STATEMENT ON EVAL 02/10/24 : Prolapse : Pt noticed vaginal prolapse showing itself at the vaginal opening four months ago . Pt was moving her couch and felt the bulging after. Pt has been fitted for pessary and will be returning the 9/30 /25 to make sure it is in proper place. Pt reported she has since returned to exercises and walking with the help of the pessary.  Excercises:  walking 2 miles daily 5 days per week with stretching routine.   Routine she discovered online : seated twists, deep core squat,  No crunches ,  no sit ups   Daily bowel movements , straining 30% of the time,   PERTINENT HISTORY:    PAIN:  Are you having pain? No  PRECAUTIONS: None  WEIGHT BEARING RESTRICTIONS: No   FALLS:  Has patient fallen in last 6 months? 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   PLOF: IND   PATIENT GOALS:   Strengthen herself     OBJECTIVE:       [facility] PT Assessment - 05/04/24 1456       Palpation   Palpation comment deviated T 1-2,  interspinals, LA  , scalenes ,T 12-L2, tightness along paraspinals, medial scapular, pects, teres minor SA L          [facility] Adult PT Treatment/Exercise - 05/04/24 1456       Manual Therapy   Manual therapy comments STM/MWM at T/L junction to reallign spine and optimzize L low ribs for diphragm excrusion            HOME EXERCISE PROGRAM: See pt instruction section    ASSESSMENT:  CLINICAL IMPRESSION:   Pt has met 5/7 goals and partially met remaining 2 goals. Anticipate pt will be able to achieve remaining goals.  Improvements:        Pt reports since she started PT, she has noticed easier bowel movement without having to strain and she is having daily movements instead of every 3 days. Pt is more intentional about her diet and making sure she is taking in more fiber    no more straining with BMs,  Type 4 across 80% of the time instead of 75% of the time  Pt feels better balance when walking    Pt reports no issues with new HEP from last week. Pt is able lay flat on her back which is something she has not been able to do     TODAY: Pt shows levelled pelvic girdle but L shoulder still lowered.  Manual Tx was applied to C/T and  T/L junction to realign spine . Pt is compliant with scoliosis -specific HEP.   Anticipate these improvements for a more stacked posture  and optimize diaphragmatic excursion on L low ribs which will  help promote optimize IAP system for improved pelvic floor function, trunk stability, gait, balance, stabilization with mobility tasks.    Plan to address pelvic floor issues once pelvis and spine are realigned to yield better outcomes. Pt has been wearing  a pessary which will be removed in  late January.              Regional interdependent approaches will yield greater benefits in pt's POC  Pt benefits from skilled PT.    OBJECTIVE IMPAIRMENTS decreased activity tolerance, decreased coordination, decreased endurance,  decreased mobility, difficulty walking, decreased ROM, decreased strength, decreased safety awareness, hypomobility, increased muscle spasms, impaired flexibility, improper body mechanics, postural dysfunction, and  scar restrictions   ACTIVITY LIMITATIONS  self-care,  sleep, home chores, ADLs  tasks    PARTICIPATION LIMITATIONS:  community, social activities    PERSONAL FACTORS   affecting patient's functional outcome:    REHAB POTENTIAL: Good   CLINICAL DECISION MAKING: Evolving/moderate complexity   EVALUATION COMPLEXITY: Moderate    PATIENT EDUCATION:    Education details: Showed pt anatomy images. Explained muscles attachments/ connection, physiology of deep core system/ spinal- thoracic-pelvis-lower kinetic chain as they relate to pt's presentation, Sx, and past Hx. Explained what and how these areas of deficits need to be restored to balance and function    See Therapeutic activity / neuromuscular re-education section  Answered pt's questions.   Person educated: Patient Education method: Explanation, Demonstration, Tactile cues, Verbal cues, and Handouts Education comprehension: verbalized understanding, returned demonstration, verbal cues required, tactile cues required, and needs further education     PLAN: PT FREQUENCY: 1x/week   PT DURATION: 10 weeks   PLANNED INTERVENTIONS:   Gait training;Stair training;Functional mobility training;DME Instruction;Therapeutic activities;Therapeutic exercise;Balance training;Neuromuscular re-education;Patient/family education;Vestibular;Visual/perceptual remediation/compensation;Passive range of motion;Moist Heat;Cryotherapy;Traction;Canalith Repostioning;Joint Manipulations;Manual lymph drainage;Manual techniques;Scar mobilization;Energy conservation;Dry needling;ADLs/Self Care Home Management;Biofeedback;Electrical Stimulation;Taping    PLAN FOR NEXT SESSION: See clinical impression for plan     GOALS: Goals reviewed with  patient? Yes  SHORT TERM GOALS: Target date: 03/09/2024    Pt will demo IND with HEP                    Baseline: Not IND            Goal status: INITIAL  LONG TERM GOALS: Target date: 08/12/24     1.Pt will demo proper deep core coordination without chest breathing and optimal excursion of diaphragm/pelvic floor in order to promote spinal stability and pelvic floor function  Baseline: dyscoordination Goal status: MET   2.  Pt will demo proper body mechanics in against gravity tasks and ADLs  work tasks, fitness  to minimize straining pelvic floor / back    Baseline: not IND, improper form that places strain on pelvic floor  Goal status: MET     3. Pt will demo increased gait speed > 1.5 m/s with reciprocal gait pattern, longer stride length  in order to ambulate safely in community and return to fitness routine  Baseline: 1.34 m/s , decreased R stance  Goal status: MET ( 03/29/24:  1.43 m/s reciprocal gait )     4. Pt will demo levelled pelvic girdle and shoulder height and btieral sideflexion  in order to progress to deep core strengthening HEP and restore mobility at spine, pelvis, gait, posture minimize falls, and improve balance  Baseline: L shoulder and L iliac crest lowered  ,  sideflexion L 43cm, R 47 cm digit III to floor  Goal status:  partially met    5. Pt will improve PFDI-7 questionnaire to  pts  score change  to demo improved QOL  Baseline:    ( greater pts indicate greater negative impact on QOL)   38  pts  ( total)   -->  0 pts  10  pts  ( UIQ-7 )  --> 0 pt 14   pts  ( CRAIQ-7 )  --> 0 pts 14  pts  ( POPIQ-7 )   --> 0 pts   Goal status: MET   6. Pt will report no more straining with bowel movements and Type 4 across 100% of time  Baseline: Daily bowel movements , straining 30% of the time, Stool Type 4 across 75% of the time  GOAL: Partially  MET ( 03/29/24:   no more straining with BMs,  Type 4 across 80% of the time )   7. Pt will be IND with using  pessary and report comfort with physical activities across 2 months  Baseline: just started wearing Sept 2025  GOAL: MET      Pia Lupe Plump, PT 05/04/2024, 11:48 AM

## 2024-05-05 NOTE — Progress Notes (Signed)
 Erica Valencia                                          MRN: [id number]   05/05/2024   The [facility] Quality Team Specialist reviewed this patient medical record for the purposes of chart review for care gap closure. The following were reviewed: chart review for care gap closure-controlling blood pressure.    [facility] Quality Team

## 2024-05-08 ENCOUNTER — Ambulatory Visit: Admitting: Physical Therapy

## 2024-05-08 ENCOUNTER — Emergency Department

## 2024-05-08 ENCOUNTER — Encounter: Payer: Self-pay | Admitting: Emergency Medicine

## 2024-05-08 ENCOUNTER — Emergency Department
Admission: EM | Admit: 2024-05-08 | Discharge: 2024-05-08 | Disposition: A | Discharge status: Home / Self Care | Attending: Emergency Medicine | Admitting: Emergency Medicine

## 2024-05-08 ENCOUNTER — Other Ambulatory Visit: Payer: Self-pay

## 2024-05-08 VITALS — BP 165/87 | HR 67

## 2024-05-08 DIAGNOSIS — R0789 Other chest pain: Secondary | ICD-10-CM | POA: Insufficient documentation

## 2024-05-08 DIAGNOSIS — M533 Sacrococcygeal disorders, not elsewhere classified: Secondary | ICD-10-CM

## 2024-05-08 DIAGNOSIS — R2689 Other abnormalities of gait and mobility: Secondary | ICD-10-CM

## 2024-05-08 DIAGNOSIS — R293 Abnormal posture: Secondary | ICD-10-CM

## 2024-05-08 LAB — CBC
HCT: 40.7 % (ref 36.0–46.0)
Hemoglobin: 13.9 g/dL (ref 12.0–15.0)
MCH: 29 pg (ref 26.0–34.0)
MCHC: 34.2 g/dL (ref 30.0–36.0)
MCV: 84.8 fL (ref 80.0–100.0)
Platelets: 269 K/uL (ref 150–400)
RBC: 4.8 MIL/uL (ref 3.87–5.11)
RDW: 12.6 % (ref 11.5–15.5)
WBC: 6.3 K/uL (ref 4.0–10.5)
nRBC: 0 % (ref 0.0–0.2)

## 2024-05-08 LAB — BASIC METABOLIC PANEL WITH GFR
Anion gap: 10 (ref 5–15)
BUN: 13 mg/dL (ref 8–23)
CO2: 27 mmol/L (ref 22–32)
Calcium: 9.4 mg/dL (ref 8.9–10.3)
Chloride: 104 mmol/L (ref 98–111)
Creatinine, Ser: 0.59 mg/dL (ref 0.44–1.00)
GFR, Estimated: >60 mL/min
Glucose, Bld: 101 mg/dL — ABNORMAL HIGH (ref 70–99)
Potassium: 4.3 mmol/L (ref 3.5–5.1)
Sodium: 141 mmol/L (ref 135–145)

## 2024-05-08 LAB — TROPONIN T, HIGH SENSITIVITY: Troponin T High Sensitivity: <15 ng/L (ref 0–19)

## 2024-05-08 NOTE — ED Triage Notes (Signed)
 Pt via POV from PT appointment. Pt was sitting in church yesterday and started having pain in the L shoulder blade. States that she took Tylenol yesterday that relieved the pain. Denies any injuries. Pt has a hx of HTN and states she did take her metoprolol . Pt is A&Ox4 and NAD

## 2024-05-08 NOTE — ED Triage Notes (Signed)
 First nurse note: Pt to ED via POV from PT. Pt reports intense left shoulder blade pain that started while sitting in church yesterday. PT checker her BP and it was 163/88

## 2024-05-08 NOTE — Therapy (Signed)
 " OUTPATIENT PHYSICAL THERAPY  NOTE  Patient Name: Erica Valencia MRN: [id number] DOB:Jan 25, 1951, 73 y.o., female    Past Medical History:  Diagnosis Date   Allergy    Bronchitis    Chest pain of uncertain etiology 09/18/2018   Coronary artery calcification seen on CT scan    a. 05/2021 Cardiac CT: Ca2+ = 108 (69th %'ile).   COVID-19 09/30/2020   Diastolic dysfunction    a. 11/2015 Echo: EF 60-65%, no rwma, GrI DD, nl RV fxn, trive MR/TR, RVSP .   Hemorrhoids    Hyperlipidemia    Hypertension    Scarlet fever    a. as child caused vision issues has to wear glasses now.   Sleep apnea    Syncope    a. 09/2020 Zio: Predominantly sinus rhythm @ 64 (41-203). 21 SVT runs, fastest 203 x 15 beats, longests 19 beats @ 99. Triggered events not assoc w/ signif arrhythmia.   Past Surgical History:  Procedure Laterality Date   APPENDECTOMY     age [age]   CARPAL TUNNEL RELEASE     left 2008   COLONOSCOPY WITH PROPOFOL  N/A 09/23/2021   Procedure: COLONOSCOPY WITH PROPOFOL ;  Surgeon: Maryruth Ole DASEN, MD;  Location: [facility] ENDOSCOPY;  Service: Endoscopy;  Laterality: N/A;   OTHER SURGICAL HISTORY     left great toe fusion 2012    Patient Active Problem List   Diagnosis Date Noted   Cystocele with first degree uterine prolapse 01/26/2024   Vaginal atrophy 09/13/2023   Pelvic pressure in female 09/13/2023   Female genital prolapse 06/29/2023   Vitamin B 12 deficiency 06/29/2023   Vitamin D  deficiency 06/29/2023   Hemorrhoids 12/02/2021   Statin intolerance    Prediabetes 11/15/2020   Annual physical exam 04/17/2020   Osteoporosis 01/25/2019   OSA (obstructive sleep apnea) 12/09/2018   Hyperlipidemia 12/16/2017   Essential hypertension 05/27/2017    PCP: Abbey   REFERRING PROVIDER: Leigh   REFERRING DIAG:  N95.2 (ICD-10-CM) - Vaginal atrophy R10.2 (ICD-10-CM) - Pelvic pressure in female  Rationale for Evaluation and Treatment Rehabilitation  THERAPY DIAG:  Sacrococcygeal  disorders, not elsewhere classified  Abnormal posture  Other abnormalities of gait and mobility  ONSET DATE:   SUBJECTIVE:                 SUBJECTIVE STATEMENT  TODAY:                                                                             Pt reports yesterday, she was sitting at church and felt pain at the L shoulder blade area.the size of her hand. It di d not radiate down her arm. It lasted all day. 7/10.  It decreased to 1/10 after taking Tylenol. Pt checked her BP last night and it was normal. Pt denied jaw, chest pain, radiating pain down L arm.   This morning, pt did her PT exercises and the L shoulder area pain at 1/10 and not bad any more.   Yesterday she was feeling stressed due to the holidays.    SUBJECTIVE STATEMENT ON EVAL 02/10/24 : Prolapse : Pt noticed vaginal prolapse showing itself at the vaginal opening four months ago . Pt  was moving her couch and felt the bulging after. Pt has been fitted for pessary and will be returning the 9/30 /25 to make sure it is in proper place. Pt reported she has since returned to exercises and walking with the help of the pessary.  Excercises:  walking 2 miles daily 5 days per week with stretching routine.   Routine she discovered online : seated twists, deep core squat,  No crunches ,  no sit ups   Daily bowel movements , straining 30% of the time,   PERTINENT HISTORY:    PAIN:  Are you having pain? No  PRECAUTIONS: None  WEIGHT BEARING RESTRICTIONS: No   FALLS:  Has patient fallen in last 6 months? 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 Pt also reported feeling more stressed lately due to holiday season  Monitored BP at beginning of session and pt presented with high BP ( see above)  Pt agreed to go to ER. Pt was transported in [facility] by therapist . Pt was registered immediately upon arrival. ER staff assisted pt  Plan to f/u with pt re: medical workup.  Plan to resume PT after pt gets medical work up for high BP and modify POC as needed  _____________  Pt has met 5/7 goals and partially met remaining 2 goals. Anticipate pt will be able to achieve remaining goals.  Improvements:        Pt reports since she started PT, she has noticed easier bowel movement without having to strain and she is having daily movements instead of every 3 days. Pt is more intentional about her diet and making sure she is taking in more fiber    no more straining with BMs,  Type 4 across 80% of the time instead of 75% of the time  Pt feels better balance when walking    Pt reports no issues with new HEP from last week. Pt is able lay flat on her back which is something she has not been able to do   Anticipate these improvements for a more stacked posture  and optimize diaphragmatic excursion on L low ribs which will  help promote optimize IAP system for improved pelvic floor function, trunk stability, gait, balance, stabilization with mobility tasks.    Plan to resume PT after pt gets medical work up for high BP and modify POC as needed Pt has been wearing  a pessary which will be removed in  late January.              Regional interdependent approaches will yield greater benefits in pt's POC  Pt benefits from skilled PT.    OBJECTIVE IMPAIRMENTS decreased activity tolerance, decreased coordination, decreased endurance, decreased mobility, difficulty walking, decreased ROM,  decreased strength, decreased safety awareness, hypomobility, increased muscle spasms, impaired flexibility, improper body mechanics, postural dysfunction, and  scar restrictions   ACTIVITY LIMITATIONS  self-care,  sleep, home chores, ADLs  tasks    PARTICIPATION LIMITATIONS:  community, social activities    PERSONAL FACTORS   affecting patient's functional outcome:    REHAB POTENTIAL: Good   CLINICAL DECISION MAKING: Evolving/moderate complexity   EVALUATION COMPLEXITY: Moderate    PATIENT EDUCATION:    Education details: Showed pt anatomy images. Explained muscles attachments/ connection, physiology of deep core system/ spinal- thoracic-pelvis-lower kinetic chain as they relate to pt's presentation, Sx, and past Hx. Explained what and how these areas of deficits need to be restored to balance and function    See Therapeutic activity / neuromuscular re-education section  Answered pt's questions.   Person educated: Patient Education method: Explanation, Demonstration, Tactile cues, Verbal cues, and Handouts Education comprehension: verbalized understanding, returned demonstration, verbal cues required, tactile cues required, and needs further education     PLAN: PT FREQUENCY: 1x/week   PT DURATION: 10 weeks   PLANNED INTERVENTIONS:   Gait training;Stair training;Functional mobility training;DME Instruction;Therapeutic activities;Therapeutic exercise;Balance training;Neuromuscular re-education;Patient/family education;Vestibular;Visual/perceptual remediation/compensation;Passive range of motion;Moist Heat;Cryotherapy;Traction;Canalith Repostioning;Joint Manipulations;Manual lymph drainage;Manual techniques;Scar mobilization;Energy conservation;Dry needling;ADLs/Self Care Home Management;Biofeedback;Electrical Stimulation;Taping    PLAN FOR NEXT SESSION: See clinical impression for plan     GOALS: Goals reviewed with patient? Yes  SHORT TERM GOALS: Target date:  03/09/2024    Pt will demo IND with HEP                    Baseline: Not IND            Goal status: INITIAL  LONG TERM GOALS: Target date: 08/12/24     1.Pt will demo proper deep core coordination without chest breathing and optimal excursion of diaphragm/pelvic floor in order to promote spinal stability and pelvic floor function  Baseline: dyscoordination Goal status: MET   2.  Pt will demo proper body mechanics in against gravity tasks and ADLs  work tasks, fitness  to minimize straining pelvic floor / back    Baseline: not IND, improper form that places strain on pelvic floor  Goal status: MET     3. Pt will demo increased gait speed > 1.5 m/s with reciprocal gait pattern, longer stride length  in order to ambulate safely in community and return to fitness routine  Baseline: 1.34 m/s , decreased R stance  Goal status: MET ( 03/29/24:  1.43 m/s reciprocal gait )     4. Pt will demo levelled pelvic girdle and shoulder height and btieral sideflexion  in order to progress to deep core strengthening HEP and restore mobility at spine, pelvis, gait, posture minimize falls, and improve balance  Baseline: L shoulder and L iliac crest lowered  ,  sideflexion L 43cm, R 47 cm digit III to floor  Goal status:  partially met    5. Pt will improve PFDI-7 questionnaire to  pts  score change  to demo improved QOL  Baseline:    ( greater pts indicate greater negative impact on QOL)   38  pts  ( total)   -->  0 pts  10  pts  ( UIQ-7 )  --> 0 pt 14   pts  ( CRAIQ-7 )  --> 0 pts 14  pts  ( POPIQ-7 )   --> 0 pts   Goal status: MET   6. Pt will report no more straining with bowel movements and Type 4 across 100% of time  Baseline: Daily bowel movements , straining 30% of the time, Stool Type 4 across 75% of the time  GOAL: Partially  MET ( 03/29/24:   no more straining with BMs,  Type 4 across 80% of the time )   7. Pt will be IND with using pessary and report comfort with physical  activities across 2 months  Baseline: just started wearing Sept 2025  GOAL: MET      Pia Lupe Plump, PT 05/08/2024, 11:08 AM  "

## 2024-05-08 NOTE — ED Provider Notes (Signed)
 "  [facility] Provider Note    Event Date/Time   First MD Initiated Contact with Patient 05/08/24 1137     (approximate)   Chest pain   HPI  Erica Valencia is a 73 y.o. female who presents with complaints of left anterior chest discomfort which has been ongoing since yesterday.  She denies injury or falls.  No shortness of breath, no pleurisy.  No fevers chills or cough.  Overall feels well but when she moves in a particular direction has discomfort     Physical Exam   Triage Vital Signs: ED Triage Vitals  Encounter Vitals Group     BP 05/08/24 1128 (!) 172/82     Girls Systolic BP Percentile --      Girls Diastolic BP Percentile --      Boys Systolic BP Percentile --      Boys Diastolic BP Percentile --      Pulse Rate 05/08/24 1128 70     Resp 05/08/24 1128 18     Temp 05/08/24 1128 97.8 F (36.6 C)     Temp Source 05/08/24 1128 Oral     SpO2 05/08/24 1128 96 %     Weight 05/08/24 1126 58.1 kg (128 lb)     Height 05/08/24 1126 1.499 m (4' 11)     Head Circumference --      Peak Flow --      Pain Score 05/08/24 1126 0     Pain Loc --      Pain Education --      Exclude from Growth Chart --     Most recent vital signs: Vitals:   05/08/24 1140 05/08/24 1334  BP:  (!) 154/76  Pulse:  72  Resp:  18  Temp:    SpO2: 100% 99%     General: Awake, no distress.  CV:  Good peripheral perfusion.  Tenderness to the left anterior mid chest which replicates her pain exactly, no rash or bruising Resp:  Normal effort.  Clear to auscultation bilaterally Abd:  No distention.  Other:  No calf pain or swelling   ED Results / Procedures / Treatments   Labs (all labs ordered are listed, but only abnormal results are displayed) Labs Reviewed  BASIC METABOLIC PANEL WITH GFR - Abnormal; Notable for the following components:      Result Value   Glucose, Bld 101 (*)    All other components within normal limits  CBC  TROPONIN T, HIGH SENSITIVITY      EKG  ED ECG REPORT I, Lamar Price, the attending physician, personally viewed and interpreted this ECG.  Date: 05/08/2024  Rhythm: normal sinus rhythm QRS Axis: normal Intervals: normal ST/T Wave abnormalities: normal Narrative Interpretation: no evidence of acute ischemia    RADIOLOGY Chest x-ray viewed interpreted by me, no acute abnormality    PROCEDURES:  Critical Care performed:   Procedures   MEDICATIONS ORDERED IN ED: Medications - No data to display   IMPRESSION / MDM / ASSESSMENT AND PLAN / ED COURSE  I reviewed the triage vital signs and the nursing notes. Patient's presentation is most consistent with acute presentation with potential threat to life or bodily function.  Patient presents with chest discomfort as detailed above, differential includes chest wall pain, no signs of zoster rash, ACS is a possibility but unlikely, pneumonia  EKG is reassuring, high sensitive troponin is normal.  Chest x-ray without pneumothorax or pneumonia  Lab work is overall reassuring, shoulder  x-ray is unremarkable  Cardiology referral placed, appropriate for discharge at this time with conservative care, return precautions discussed, she agrees to this plan.        FINAL CLINICAL IMPRESSION(S) / ED DIAGNOSES   Final diagnoses:  Chest wall pain     Rx / DC Orders   ED Discharge Orders          Ordered    Ambulatory referral to Cardiology       Comments: If you have not heard from the Cardiology office within the next 72 hours please call [phone].   05/08/24 1328             Note:  This document was prepared using Dragon voice recognition software and may include unintentional dictation errors.   Arlander Charleston, MD 05/08/24 1517  "

## 2024-05-14 DIAGNOSIS — I2510 Atherosclerotic heart disease of native coronary artery without angina pectoris: Secondary | ICD-10-CM | POA: Insufficient documentation

## 2024-05-14 NOTE — Progress Notes (Unsigned)
 "        Date:  05/16/2024   ID:  Erica Valencia, DOB [birth year], MRN [id number]  Patient Location:  [address] [place] [address]   Provider location:   [facility], [place] office  PCP:  Bair, Kalpana, MD  Cardiologist:  Perla MOCCASIN [facility]  Chief Complaint  Patient presents with   New Patient (Initial Visit)    Patient denies chest pain or shortness of breath.     History of Present Illness:    Erica Valencia is a 73 y.o. female past medical history of Hyperlipidemia Hypertension Coronary calcification on calcium  scoring score 108 Presents for f/u of her chest pain, palpitations, syncope  Last seen in clinic 1/23  Reports December 22 she was doing her physical therapy, blood pressure noted to be elevated  May not have metoprolol  Some back pain, she was referred to the emergency room In ER 05/08/24: chest wall pain, atypical Non acute, cardiac workup negative  Better since then no further chest pain, no back pain BP better today 133/68 Remains on metoprolol  succinate 25 daily amlodipine  2.5 daily twice daily  CT coronary calcium  score Calcium  score 108 Takes Lipitor 10 every other day Total cholesterol previously 208 A1c 5.9  Father died of MI  EKG from 05/22/2024 reviewed No acute findings  Other past medical history reviewed Got out of bed to urinate, syncope 09/30/2020 Husband revived her, he drove her to ER Remembers drive in [place] out 2x in ER Covid positive, treated with paxlovid  for COVID.  Zio monitor  Patient had a min HR of 41 bpm, max HR of 203 bpm, and avg HR of 64 bpm. Predominant underlying rhythm was Sinus Rhythm.   21 Supraventricular Tachycardia runs occurred, the run with the fastest interval lasting 15 beats with a max rate of 203 bpm, the longest lasting 19 beats with an avg rate of 99 bpm. Not patient triggered    Isolated SVEs were rare (<1.0%), SVE Couplets were rare (<1.0%), and SVE Triplets  were rare (<1.0%). Isolated VEs were rare (<1.0%, 1735), VE Couplets were rare (<1.0%, 3), and VE Triplets were rare (<1.0%, 1). Ventricular Trigeminy was present.   Patient triggered events not associated with significant arrhythmia  Seen in the emergency room September 12, 2018 for chest pain, anxiety described as a squeezing, substernal, nonradiating.    last stress test was in 1995 and last echo in 2017 in [place] .    Past Medical History:  Diagnosis Date   Allergy    Bronchitis    Chest pain of uncertain etiology 09/18/2018   Coronary artery calcification seen on CT scan    a. 05/2021 Cardiac CT: Ca2+ = 108 (69th %'ile).   COVID-19 09/30/2020   Diastolic dysfunction    a. 11/2015 Echo: EF 60-65%, no rwma, GrI DD, nl RV fxn, trive MR/TR, RVSP .   Hemorrhoids    Hyperlipidemia    Hypertension    Scarlet fever    a. as child caused vision issues has to wear glasses now.   Sleep apnea    Syncope    a. 09/2020 Zio: Predominantly sinus rhythm @ 64 (41-203). 21 SVT runs, fastest 203 x 15 beats, longests 19 beats @ 99. Triggered events not assoc w/ signif arrhythmia.   Past Surgical History:  Procedure Laterality Date   APPENDECTOMY     age [age]   CARPAL TUNNEL RELEASE     left 2008   COLONOSCOPY WITH PROPOFOL  N/A 09/23/2021  Procedure: COLONOSCOPY WITH PROPOFOL ;  Surgeon: Maryruth Ole DASEN, MD;  Location: [facility] ENDOSCOPY;  Service: Endoscopy;  Laterality: N/A;   OTHER SURGICAL HISTORY     left great toe fusion 2012      Current Meds  Medication Sig   albuterol  (VENTOLIN  HFA) 108 (90 Base) MCG/ACT inhaler Inhale 1-2 puffs into the lungs every 6 (six) hours as needed.   amLODipine  (NORVASC ) 2.5 MG tablet Take 1 tablet (2.5 mg total) by mouth 2 (two) times daily.   aspirin  EC 81 MG tablet Take 1 tablet (81 mg total) by mouth daily. Swallow whole.   atorvastatin  (LIPITOR) 10 MG tablet Take 1 tablet (10 mg total) by mouth every other day.   metoprolol  succinate (TOPROL -XL)  25 MG 24 hr tablet Take 1 tablet (25 mg total) by mouth at bedtime.     Allergies:   Sulfa antibiotics, Ace inhibitors, Clarithromycin, Penicillins, Zoster vaccine live, Statins, Telmisartan , Zetia  [ezetimibe ], and Rosuvastatin    Social History   Tobacco Use   Smoking status: Never   Smokeless tobacco: Never  Vaping Use   Vaping status: Never Used  Substance Use Topics   Alcohol use: No   Drug use: No     Current Outpatient Medications on File Prior to Visit  Medication Sig Dispense Refill   albuterol  (VENTOLIN  HFA) 108 (90 Base) MCG/ACT inhaler Inhale 1-2 puffs into the lungs every 6 (six) hours as needed. 8 g 0   amLODipine  (NORVASC ) 2.5 MG tablet Take 1 tablet (2.5 mg total) by mouth 2 (two) times daily. 180 tablet 3   aspirin  EC 81 MG tablet Take 1 tablet (81 mg total) by mouth daily. Swallow whole.     atorvastatin  (LIPITOR) 10 MG tablet Take 1 tablet (10 mg total) by mouth every other day. 45 tablet 1   metoprolol  succinate (TOPROL -XL) 25 MG 24 hr tablet Take 1 tablet (25 mg total) by mouth at bedtime. 90 tablet 3   guaiFENesin -codeine  100-10 MG/5ML syrup Take 5 mLs by mouth at bedtime as needed for cough. (Patient not taking: Reported on 05/16/2024) 120 mL 0   No current facility-administered medications on file prior to visit.     Family Hx: The patient's family history includes Heart attack in her father; Other in her mother. There is no history of Breast cancer.  ROS:   Please see the history of present illness.    Review of Systems  Constitutional: Negative.   HENT: Negative.    Respiratory: Negative.    Cardiovascular: Negative.   Gastrointestinal: Negative.   Musculoskeletal: Negative.   Neurological: Negative.   Psychiatric/Behavioral: Negative.    All other systems reviewed and are negative.     Labs/Other Tests and Data Reviewed:    Recent Labs: 12/27/2023: ALT 14 05/08/2024: BUN 13; Creatinine, Ser 0.59; Hemoglobin 13.9; Platelets 269; Potassium 4.3;  Sodium 141   Recent Lipid Panel Lab Results  Component Value Date/Time   CHOL 208 (H) 12/27/2023 01:45 PM   TRIG 101.0 12/27/2023 01:45 PM   HDL 66.50 12/27/2023 01:45 PM   CHOLHDL 3 12/27/2023 01:45 PM   LDLCALC 122 (H) 12/27/2023 01:45 PM    Wt Readings from Last 3 Encounters:  05/16/24 134 lb (60.8 kg)  05/08/24 128 lb (58.1 kg)  02/22/24 131 lb (59.4 kg)     Exam:    Vital Signs: Vital signs may also be detailed in the HPI BP (!) 140/72 (BP Location: Left Arm, Patient Position: Sitting, Cuff Size: Normal)   Pulse 68  Ht 5' (1.524 m)   Wt 134 lb (60.8 kg)   SpO2 98%   BMI 26.17 kg/m   Constitutional:  oriented to person, place, and time. No distress.  HENT:  Head: Grossly normal Eyes:  no discharge. No scleral icterus.  Neck: No JVD, no carotid bruits  Cardiovascular: Regular rate and rhythm, no murmurs appreciated Pulmonary/Chest: Clear to auscultation bilaterally, no wheezes or rails Abdominal: Soft.  no distension.  no tenderness.  Musculoskeletal: Normal range of motion Neurological:  normal muscle tone. Coordination normal. No atrophy Skin: Skin warm and dry Psychiatric: normal affect, pleasant   ASSESSMENT & PLAN:    Syncope In the setting of COVID No recent episodes Has completed Zio monitor in the past  Essential hypertension Blood pressure is well controlled on today's visit. No changes made to the medications.  Chest pain Currently with no symptoms of angina. No further workup at this time. Continue current medication regimen.  Coronary calcium  Low score,  Tolerating a atorvastatin  10 mg every other day Does not want Zetia  given prior possible side effects Could consider PCSK9 inhibitor  Hyperlipidemia, unspecified hyperlipidemia type Continue Lipitor 10 mg every other day    Signed, Malajah Oceguera, MD  05/16/2024 2:50 PM    [facility] [address], [place], [place]  [address]     "

## 2024-05-16 ENCOUNTER — Ambulatory Visit: Admitting: Cardiovascular Disease

## 2024-05-16 ENCOUNTER — Encounter: Payer: Self-pay | Admitting: Cardiovascular Disease

## 2024-05-16 VITALS — BP 140/72 | HR 68 | Ht 60.0 in | Wt 134.0 lb

## 2024-05-16 DIAGNOSIS — R7303 Prediabetes: Secondary | ICD-10-CM

## 2024-05-16 DIAGNOSIS — Z789 Other specified health status: Secondary | ICD-10-CM | POA: Diagnosis not present

## 2024-05-16 DIAGNOSIS — G4733 Obstructive sleep apnea (adult) (pediatric): Secondary | ICD-10-CM

## 2024-05-16 DIAGNOSIS — I2510 Atherosclerotic heart disease of native coronary artery without angina pectoris: Secondary | ICD-10-CM

## 2024-05-16 DIAGNOSIS — E782 Mixed hyperlipidemia: Secondary | ICD-10-CM | POA: Diagnosis not present

## 2024-05-16 DIAGNOSIS — I10 Essential (primary) hypertension: Secondary | ICD-10-CM

## 2024-05-16 MED ORDER — AMLODIPINE BESYLATE 2.5 MG PO TABS: 2.5000 mg | ORAL_TABLET | Freq: Two times a day (BID) | ORAL | 1 refills | Status: AC

## 2024-05-16 NOTE — Patient Instructions (Signed)

## 2024-05-22 ENCOUNTER — Encounter: Admitting: Physical Therapy

## 2024-06-01 NOTE — Progress Notes (Unsigned)
" °  HPI:      Ms. Erica Valencia is a 74 y.o. [phone] who presents today for her pessary follow up and examination related to her pelvic floor weakening.  Pt reports tolerating the pessary well with *** no vaginal bleeding and *** no vaginal discharge.  Symptoms of pelvic floor weakening have greatly improved. She is voiding and defecating {With/without:5700} difficulty. She currently has a #3 ring with support pessary.  PMHx: She  has a past medical history of Allergy, Bronchitis, Chest pain of uncertain etiology (09/18/2018), Coronary artery calcification seen on CT scan, COVID-19 (09/30/2020), Diastolic dysfunction, Hemorrhoids, Hyperlipidemia, Hypertension, Scarlet fever, Sleep apnea, and Syncope. Also,  has a past surgical history that includes Appendectomy; Carpal tunnel release; OTHER SURGICAL HISTORY; and Colonoscopy with propofol  (N/A, 09/23/2021)., family history includes Heart attack in her father; Other in her mother.,  reports that she has never smoked. She has never used smokeless tobacco. She reports that she does not drink alcohol and does not use drugs.  She has a current medication list which includes the following prescription(s): albuterol , amlodipine , aspirin  ec, atorvastatin , and metoprolol  succinate. Also, is allergic to sulfa antibiotics, ace inhibitors, clarithromycin, penicillins, zoster vaccine live, statins, telmisartan , zetia  [ezetimibe ], and rosuvastatin .  ROS  Objective: There were no vitals taken for this visit. OBGyn Exam  Pessary Care Pessary removed and cleaned.  Vagina checked - without erosions - pessary replaced.  A/P:  #3 ring with support pessary was cleaned and replaced today. Instructions given for care. Concerning symptoms to observe for are counseled to patient. Follow up scheduled for 3 months.  ***Pessary holiday advised.  Will leave out and follow symptoms closely.  Replace in 4 weeks.  Rollo FORBES Louder, RN  [facility], [facility] 06/01/2024  10:39 AM "

## 2024-06-06 ENCOUNTER — Ambulatory Visit: Payer: Self-pay | Admitting: Obstetrics

## 2024-06-07 ENCOUNTER — Ambulatory Visit (INDEPENDENT_AMBULATORY_CARE_PROVIDER_SITE_OTHER)

## 2024-06-07 VITALS — BP 139/73 | HR 66 | Temp 97.5°F | Ht 60.0 in | Wt 136.0 lb

## 2024-06-07 DIAGNOSIS — R7303 Prediabetes: Secondary | ICD-10-CM | POA: Diagnosis not present

## 2024-06-07 DIAGNOSIS — E782 Mixed hyperlipidemia: Secondary | ICD-10-CM

## 2024-06-07 DIAGNOSIS — I10 Essential (primary) hypertension: Secondary | ICD-10-CM

## 2024-06-07 DIAGNOSIS — E538 Deficiency of other specified B group vitamins: Secondary | ICD-10-CM

## 2024-06-07 NOTE — Assessment & Plan Note (Signed)
 Continue metoprolol  succinate 25 mg, amlodipine  2.5 mg twice a day as prescribed by cardiologist.

## 2024-06-07 NOTE — Progress Notes (Unsigned)
" °  HPI:      Ms. Erica Valencia is a 74 y.o. [phone] who presents today for her pessary follow up and examination related to her pelvic floor weakening.  Pt reports tolerating the pessary well with *** no vaginal bleeding and *** no vaginal discharge.  Symptoms of pelvic floor weakening have greatly improved. She is voiding and defecating {With/without:5700} difficulty. She currently has a #3 ring with support pessary.  PMHx: She  has a past medical history of Allergy, Bronchitis, Chest pain of uncertain etiology (09/18/2018), Coronary artery calcification seen on CT scan, COVID-19 (09/30/2020), Diastolic dysfunction, Hemorrhoids, Hyperlipidemia, Hypertension, Scarlet fever, Sleep apnea, and Syncope. Also,  has a past surgical history that includes Appendectomy; Carpal tunnel release; OTHER SURGICAL HISTORY; and Colonoscopy with propofol  (N/A, 09/23/2021)., family history includes Heart attack in her father; Other in her mother.,  reports that she has never smoked. She has never used smokeless tobacco. She reports that she does not drink alcohol and does not use drugs.  She has a current medication list which includes the following prescription(s): albuterol , amlodipine , aspirin  ec, atorvastatin , and metoprolol  succinate. Also, is allergic to sulfa antibiotics, ace inhibitors, clarithromycin, penicillins, zoster vaccine live, statins, telmisartan , zetia  [ezetimibe ], and rosuvastatin .  ROS  Objective: There were no vitals taken for this visit. OBGyn Exam  Pessary Care Pessary removed and cleaned.  Vagina checked - without erosions - pessary replaced.  A/P:  #3 ring with support pessary was cleaned and replaced today. Instructions given for care. Concerning symptoms to observe for are counseled to patient. Follow up scheduled for 3 months.  ***Pessary holiday advised.  Will leave out and follow symptoms closely.  Replace in 4 weeks.  Estil Mangle DO [facility], [facility] 06/07/2024  4:53 PM "

## 2024-06-07 NOTE — Assessment & Plan Note (Signed)
 H/O vitamin B12 deficiency, previously taking B12 supplement with increased B12 level.  Recommend repeating vitamin B12 level.  Lab ordered.  Management pending result. Orders:   B12; Future

## 2024-06-07 NOTE — Assessment & Plan Note (Signed)
 LDL cholesterol was 122 in August. Lipitor taken inconsistently. Emphasized LDL control to reduce cardiovascular risk. Scheduled fasting lab appointment to check cholesterol levels.  Check hepatic function, lab ordered. Continue Lipitor 10 mg 2-3 times a week as tolerated, aim for more consistent dosing. Orders:   Lipid panel; Future   Hepatic function panel; Future

## 2024-06-07 NOTE — Assessment & Plan Note (Signed)
 Chronic, stable.  Check A1c. Orders:   HgB A1c; Future

## 2024-06-07 NOTE — Progress Notes (Signed)
 "  Established Patient Office Visit   Subjective  Patient ID: Erica Valencia, female    DOB: Oct 05, 1950  Age: 74 y.o. MRN: [id number]  Chief Complaint  Patient presents with   Hyperlipidemia   Hypertension   Osteoporosis   Hemorrhoids    Discussed the use of AI scribe software for clinical note transcription with the patient, who gave verbal consent to proceed.  History of Present Illness Erica Valencia is a 73 year old female with hypertension and hyperlipidemia who presents for a routine follow-up visit.  She is managing her hypertension with amlodipine  2.5 mg (twice a day)and Metoprolol  succinate 25 mg daily. She reports doing well on the medication. However, she experienced an episode of elevated blood pressure a couple of weeks ago, which she attributes to missing a dose. She has a history of fluctuating blood pressure. No new or worsening palpitations. No leg swelling. She reports that her feet can become puffy at the end of the day.   For hyperlipidemia, she takes Lipitor 10 mg approximately twice a week, although it was prescribed for every other day. She reports no side effects from the medication. Her last cholesterol check in August showed an LDL of 122. She does not take aspirin  regularly, only when she remembers.  She has a history of elevated B12 levels, which led her to discontinue B12 supplements. Her B12 level decreased from 1500 to 396, and she has been off the supplement for a few months.   She attends pelvic floor therapy, which she finds beneficial. She remains active but notes that cold weather has limited her walking activities. She uses a pill box to manage her medications due to her busy schedule.     ROS As per HPI    Objective:     BP 139/73 (BP Location: Left Arm, Patient Position: Sitting)   Pulse 66   Temp (!) 97.5 F (36.4 C) (Oral)   Ht 5' (1.524 m)   Wt 136 lb (61.7 kg)   SpO2 97%   BMI 26.56 kg/m      06/07/2024    4:23 PM 12/31/2023     9:40 AM 12/27/2023    1:03 PM  Depression screen PHQ 2/9  Decreased Interest 0 0 0  Down, Depressed, Hopeless 0 0 0  PHQ - 2 Score 0 0 0  Altered sleeping 0 0 0  Tired, decreased energy 0 0 0  Change in appetite 0 0 0  Feeling bad or failure about yourself  0 0 0  Trouble concentrating 0 0 0  Moving slowly or fidgety/restless 0 0 0  Suicidal thoughts 0 0 0  PHQ-9 Score 0 0  0   Difficult doing work/chores Not difficult at all Not difficult at all Not difficult at all     Data saved with a previous flowsheet row definition      06/07/2024    4:23 PM 12/27/2023    1:03 PM 01/04/2023    1:32 PM 09/02/2022    3:31 PM  GAD 7 : Generalized Anxiety Score  Nervous, Anxious, on Edge 0 0  0  0   Control/stop worrying 0 0  0  0   Worry too much - different things 0 0  0  0   Trouble relaxing 0 0  0  0   Restless 0 0  0  0   Easily annoyed or irritable 0 0  0  0   Afraid - awful might happen 0 0  0  0   Total GAD 7 Score 0 0 0 0  Anxiety Difficulty Not difficult at all Not difficult at all Not difficult at all Not difficult at all     Data saved with a previous flowsheet row definition      06/07/2024    4:23 PM 12/31/2023    9:40 AM 12/27/2023    1:03 PM  Depression screen PHQ 2/9  Decreased Interest 0 0 0  Down, Depressed, Hopeless 0 0 0  PHQ - 2 Score 0 0 0  Altered sleeping 0 0 0  Tired, decreased energy 0 0 0  Change in appetite 0 0 0  Feeling bad or failure about yourself  0 0 0  Trouble concentrating 0 0 0  Moving slowly or fidgety/restless 0 0 0  Suicidal thoughts 0 0 0  PHQ-9 Score 0 0  0   Difficult doing work/chores Not difficult at all Not difficult at all Not difficult at all     Data saved with a previous flowsheet row definition      06/07/2024    4:23 PM 12/27/2023    1:03 PM 01/04/2023    1:32 PM 09/02/2022    3:31 PM  GAD 7 : Generalized Anxiety Score  Nervous, Anxious, on Edge 0 0  0  0   Control/stop worrying 0 0  0  0   Worry too much - different things  0 0  0  0   Trouble relaxing 0 0  0  0   Restless 0 0  0  0   Easily annoyed or irritable 0 0  0  0   Afraid - awful might happen 0 0  0  0   Total GAD 7 Score 0 0 0 0  Anxiety Difficulty Not difficult at all Not difficult at all Not difficult at all Not difficult at all     Data saved with a previous flowsheet row definition   SDOH Screenings   Food Insecurity: No Food Insecurity (12/31/2023)  Housing: Unknown (12/31/2023)  Transportation Needs: No Transportation Needs (12/31/2023)  Utilities: Not At Risk (12/31/2023)  Alcohol Screen: Low Risk (12/31/2023)  Depression (PHQ2-9): Low Risk (06/07/2024)  Financial Resource Strain: Low Risk (12/31/2023)  Physical Activity: Sufficiently Active (12/31/2023)  Social Connections: Socially Integrated (12/31/2023)  Stress: No Stress Concern Present (12/31/2023)  Tobacco Use: Low Risk (06/07/2024)  Health Literacy: Adequate Health Literacy (12/31/2023)     Physical Exam Constitutional:      Appearance: Normal appearance.  HENT:     Head: Normocephalic and atraumatic.     Mouth/Throat:     Mouth: Mucous membranes are moist.  Neck:     Thyroid : No thyroid  mass or thyroid  tenderness.  Cardiovascular:     Rate and Rhythm: Normal rate and regular rhythm.  Pulmonary:     Effort: Pulmonary effort is normal.     Breath sounds: Normal breath sounds. No wheezing.  Abdominal:     General: Bowel sounds are normal.     Palpations: Abdomen is soft.  Musculoskeletal:     Cervical back: Neck supple. No rigidity.     Right lower leg: No edema.     Left lower leg: No edema.  Skin:    General: Skin is warm.  Neurological:     Mental Status: She is alert and oriented to person, place, and time.  Psychiatric:        Mood and Affect: Mood normal.  Behavior: Behavior normal.        No results found for any visits on 06/07/24.  The 10-year ASCVD risk score (Arnett DK, et al., 2019) is: 24.5%     Assessment & Plan:   Assessment &  Plan Mixed hyperlipidemia LDL cholesterol was 122 in August. Lipitor taken inconsistently. Emphasized LDL control to reduce cardiovascular risk. Scheduled fasting lab appointment to check cholesterol levels.  Check hepatic function, lab ordered. Continue Lipitor 10 mg 2-3 times a week as tolerated, aim for more consistent dosing. Orders:   Lipid panel; Future   Hepatic function panel; Future  Vitamin B 12 deficiency H/O vitamin B12 deficiency, previously taking B12 supplement with increased B12 level.  Recommend repeating vitamin B12 level.  Lab ordered.  Management pending result. Orders:   B12; Future  Essential hypertension Continue metoprolol  succinate 25 mg, amlodipine  2.5 mg twice a day as prescribed by cardiologist.     Prediabetes Chronic, stable.  Check A1c. Orders:   HgB A1c; Future    Return for fasting lab at patient's convinience, f/u 6 months with Dr. Abbey .   Luke Abbey, MD "

## 2024-06-13 ENCOUNTER — Ambulatory Visit: Payer: Self-pay | Admitting: Obstetrics

## 2024-06-13 ENCOUNTER — Ambulatory Visit: Admitting: Physical Therapy

## 2024-06-13 NOTE — Progress Notes (Signed)
 Erica Valencia                                          MRN: [id number]   06/13/2024   The [facility] Quality Team Specialist reviewed this patient medical record for the purposes of chart review for care gap closure. The following were reviewed: chart review for care gap closure-controlling blood pressure.    [facility] Quality Team

## 2024-06-22 ENCOUNTER — Ambulatory Visit: Admitting: Physical Therapy

## 2024-06-28 ENCOUNTER — Ambulatory Visit

## 2024-07-10 ENCOUNTER — Ambulatory Visit

## 2024-07-11 ENCOUNTER — Ambulatory Visit: Payer: Self-pay | Admitting: Obstetrics & Gynecology

## 2024-11-28 ENCOUNTER — Other Ambulatory Visit

## 2024-12-05 ENCOUNTER — Ambulatory Visit

## 2025-01-03 ENCOUNTER — Ambulatory Visit
